# Patient Record
Sex: Male | Born: 1940
Health system: Southern US, Community
[De-identification: ages and names within clinical notes are randomized; demographics above are authoritative.]

## PROBLEM LIST (undated history)

## (undated) DIAGNOSIS — R413 Other amnesia: Secondary | ICD-10-CM

## (undated) HISTORY — PX: CATARACT EXTRACTION: SUR2

## (undated) HISTORY — DX: Other amnesia: R41.3

---

## 2004-06-26 ENCOUNTER — Ambulatory Visit: Payer: Self-pay | Admitting: Gastroenterology

## 2008-02-12 DIAGNOSIS — K219 Gastro-esophageal reflux disease without esophagitis: Secondary | ICD-10-CM | POA: Insufficient documentation

## 2008-02-17 ENCOUNTER — Ambulatory Visit: Payer: Self-pay | Admitting: Gastroenterology

## 2008-02-17 DIAGNOSIS — K648 Other hemorrhoids: Secondary | ICD-10-CM | POA: Insufficient documentation

## 2008-02-17 DIAGNOSIS — E785 Hyperlipidemia, unspecified: Secondary | ICD-10-CM | POA: Insufficient documentation

## 2008-03-15 ENCOUNTER — Ambulatory Visit: Payer: Self-pay | Admitting: Gastroenterology

## 2012-12-31 ENCOUNTER — Telehealth: Payer: Self-pay | Admitting: Cardiovascular Disease

## 2012-12-31 DIAGNOSIS — R972 Elevated prostate specific antigen [PSA]: Secondary | ICD-10-CM

## 2012-12-31 DIAGNOSIS — E782 Mixed hyperlipidemia: Secondary | ICD-10-CM

## 2012-12-31 DIAGNOSIS — R5383 Other fatigue: Secondary | ICD-10-CM

## 2012-12-31 DIAGNOSIS — Z79899 Other long term (current) drug therapy: Secondary | ICD-10-CM

## 2012-12-31 NOTE — Telephone Encounter (Signed)
Carlos Pierce is needing a lab order placed in the mail to him before his about with Dr.kelly on 01/08/13 @ 8:30am    Thanks

## 2013-01-01 NOTE — Telephone Encounter (Signed)
Labs ordered and mailed.  Returned call and pt informed.  Pt verbalized understanding and agreed w/ plan.

## 2013-01-02 ENCOUNTER — Ambulatory Visit: Payer: Self-pay | Admitting: Cardiovascular Disease

## 2013-01-06 ENCOUNTER — Telehealth: Payer: Self-pay | Admitting: Cardiovascular Disease

## 2013-01-06 NOTE — Telephone Encounter (Signed)
Pt called stating that he needed his lab slip mailed to him. That he has called before and asked for this.

## 2013-01-06 NOTE — Telephone Encounter (Signed)
Returned call.  Pt informed lab(s) ordered and to present FASTING to any [facility] to have completed.  Lab slip mailed last week.  Pt verbalized understanding and agreed w/ plan.

## 2013-01-08 ENCOUNTER — Ambulatory Visit: Payer: Self-pay | Admitting: Cardiovascular Disease

## 2013-01-13 LAB — COMPREHENSIVE METABOLIC PANEL
ALT: 43 U/L (ref 0–53)
CO2: 26 mEq/L (ref 19–32)
Calcium: 10 mg/dL (ref 8.4–10.5)
Chloride: 107 mEq/L (ref 96–112)
Potassium: 4.5 mEq/L (ref 3.5–5.3)
Sodium: 141 mEq/L (ref 135–145)
Total Protein: 6.9 g/dL (ref 6.0–8.3)

## 2013-01-13 LAB — LIPID PANEL
Cholesterol: 144 mg/dL (ref 0–200)
VLDL: 19 mg/dL (ref 0–40)

## 2013-01-13 LAB — CBC
Platelets: 163 10*3/uL (ref 150–400)
RBC: 4.6 MIL/uL (ref 4.22–5.81)
WBC: 6.3 10*3/uL (ref 4.0–10.5)

## 2013-01-13 LAB — PSA: PSA: 2.61 ng/mL (ref <=4.00)

## 2013-01-16 ENCOUNTER — Encounter: Payer: Self-pay | Admitting: Cardiovascular Disease

## 2013-01-16 ENCOUNTER — Ambulatory Visit (INDEPENDENT_AMBULATORY_CARE_PROVIDER_SITE_OTHER): Payer: Medicare Other | Admitting: Cardiovascular Disease

## 2013-01-16 VITALS — BP 102/62 | HR 66 | Ht 66.0 in | Wt 158.0 lb

## 2013-01-16 DIAGNOSIS — I110 Hypertensive heart disease with heart failure: Secondary | ICD-10-CM

## 2013-01-16 DIAGNOSIS — E782 Mixed hyperlipidemia: Secondary | ICD-10-CM

## 2013-01-16 DIAGNOSIS — E785 Hyperlipidemia, unspecified: Secondary | ICD-10-CM

## 2013-01-16 DIAGNOSIS — I509 Heart failure, unspecified: Secondary | ICD-10-CM

## 2013-01-16 DIAGNOSIS — I10 Essential (primary) hypertension: Secondary | ICD-10-CM | POA: Insufficient documentation

## 2013-01-16 MED ORDER — PITAVASTATIN CALCIUM 4 MG PO TABS: ORAL_TABLET | ORAL | Status: DC

## 2013-01-16 NOTE — Progress Notes (Signed)
Patient ID: Carlos Pierce, male   DOB: 01/03/1941, 72 y.o.   MRN: [id number]     HPI: Carlos Pierce, is a 72 y.o. male density office today for a month cardiology evaluation.  Carlos Pierce is now 72 years old. He has a history of mild hypertension as well as hyperlipidemia. Remotely, he has had mild elevation of PSAs which have been followed by doctors Kimbrough and more recently dull stent. All transaminase elevation in the past on Crestor.  Over the past time period 2013, laboratory at that time showed total cholesterol 213 triglycerides 136 HDL 46 LDL 140. PSA was 3.19. Normal LFTs. Socially, he has been on Levoxyl 2 mg in addition to Zetia 10 mg. Repeat blood work on 01/12/2013 showed a total cholesterol 144 HDL 55 LDL 70 VLDL 19 triglycerides 96. He is tolerating the medicine well without side effects. Liver function studies have remained normal. His PSA checked weight is decreased at 2.61. He remains active and exercises daily. The past 5 years he has lost 15-20 pounds as a result of his activity and improve diet.    Current Outpatient Prescriptions  Medication Sig Dispense Refill  . aspirin 81 MG tablet Take 81 mg by mouth daily.      . Multiple Vitamin (MULTI-VITAMIN DAILY PO) Take by mouth.      . finasteride (PROSCAR) 5 MG tablet Take 1 tablet by mouth daily.      Marland Kitchen lisinopril (PRINIVIL,ZESTRIL) 5 MG tablet Take 1 tablet by mouth daily.      Marland Kitchen LIVALO 2 MG TABS Take 1 tablet by mouth daily.      Marland Kitchen ZETIA 10 MG tablet Take 1 tablet by mouth daily.       No current facility-administered medications for this visit.    History   Social History  . Marital Status: Married    Spouse Name: N/A    Number of Children: N/A  . Years of Education: N/A   Occupational History  . Not on file.   Social History Main Topics  . Smoking status: Never Smoker   . Smokeless tobacco: Never Used  . Alcohol Use: 1.0 oz/week    2 drink(s) per week  . Drug Use: Not on file  .  Sexually Active: Not on file   Other Topics Concern  . Not on file   Social History Narrative  . No narrative on file    Additional social history is notable in that he is married has one child who is also my patient who has a history of hyperlipidemia.  ROS is negative for fevers, chills or night sweats. He denies chest pain. He denies palpitations. He denies presyncope or syncope. He denies wheezing. He denies bleeding. He denies abdominal pain. He denies paresthesias. He denies myalgias per   Other system review is negative.  PE BP 102/62  Pulse 66  Ht 5\' 6"  (1.676 m)  Wt 158 lb (71.668 kg)  BMI 25.51 kg/m2  Repeat blood pressure by me was 118/70 General: Alert, oriented, no distress.  Skin: normal turgor, no rashes HEENT: Normocephalic, atraumatic. Pupils round and reactive; sclera anicteric;no lid lag.  Nose without nasal septal hypertrophy Mouth/Parynx benign; Mallinpatti scale 2 Neck: No JVD, no carotid briuts Lungs: clear to ausculatation and percussion; no wheezing or rales Heart: RRR, s1 s2 normal 1/6 sem Abdomen: soft, nontender; no hepatosplenomehaly, BS+; abdominal aorta nontender and not dilated by palpation. Pulses 2+ Extremities: no clubbing cyanosis or edema, Homan's sign negative  Neurologic: grossly nonfocal  ECG: Sinus rhythm. Nondiagnostic Q wave in lead 3. Mild early repolarization.  LABS:  BMET    Component Value Date/Time   NA 141 01/12/2013 0900   K 4.5 01/12/2013 0900   CL 107 01/12/2013 0900   CO2 26 01/12/2013 0900   GLUCOSE 109* 01/12/2013 0900   BUN 27* 01/12/2013 0900   CREATININE 1.13 01/12/2013 0900   CALCIUM 10.0 01/12/2013 0900     Hepatic Function Panel     Component Value Date/Time   PROT 6.9 01/12/2013 0900   ALBUMIN 4.3 01/12/2013 0900   AST 27 01/12/2013 0900   ALT 43 01/12/2013 0900   ALKPHOS 39 01/12/2013 0900   BILITOT 0.5 01/12/2013 0900     CBC    Component Value Date/Time   WBC 6.3 01/12/2013 0900   RBC 4.60 01/12/2013 0900   HGB 13.5  01/12/2013 0900   HCT 41.3 01/12/2013 0900   PLT 163 01/12/2013 0900   MCV 89.8 01/12/2013 0900   MCH 29.3 01/12/2013 0900   MCHC 32.7 01/12/2013 0900   RDW 13.7 01/12/2013 0900     BNP No results found for this basename: probnp    Lipid Panel     Component Value Date/Time   CHOL 144 01/12/2013 0900   TRIG 96 01/12/2013 0900   HDL 55 01/12/2013 0900   CHOLHDL 2.6 01/12/2013 0900   VLDL 19 01/12/2013 0900   LDLCALC 70 01/12/2013 0900     RADIOLOGY: No results found.    ASSESSMENT AND PLAN: Clinically, Carlos Pierce is doing well. He tells me his weight at home today prior to putting close on was 152 pounds which is approximately 20 pound weight loss over the last 5 years. He is tolerating little low and combination therapy now as his lipid studies excellent with an LDL cholesterol of 70 to his exercising regularly. He is tolerating low-dose lisinopril with optimal blood pressure. He is exercising daily. I will see him in 6 months for cardiology evaluation or sooner if problems arise.    Lennette Bihari, MD, [facility]  01/16/2013 9:44 AM

## 2013-01-16 NOTE — Patient Instructions (Signed)
Your physician recommends that you schedule a follow-up appointment in: 6 MONTHS. No changes has been made in your therapy today. 

## 2013-01-23 ENCOUNTER — Encounter: Payer: Self-pay | Admitting: *Deleted

## 2013-05-04 ENCOUNTER — Other Ambulatory Visit: Payer: Self-pay | Admitting: *Deleted

## 2013-05-04 ENCOUNTER — Telehealth: Payer: Self-pay | Admitting: *Deleted

## 2013-05-04 DIAGNOSIS — I10 Essential (primary) hypertension: Secondary | ICD-10-CM

## 2013-05-04 DIAGNOSIS — Z125 Encounter for screening for malignant neoplasm of prostate: Secondary | ICD-10-CM

## 2013-05-04 DIAGNOSIS — R5381 Other malaise: Secondary | ICD-10-CM

## 2013-05-04 DIAGNOSIS — N429 Disorder of prostate, unspecified: Secondary | ICD-10-CM

## 2013-05-04 DIAGNOSIS — E782 Mixed hyperlipidemia: Secondary | ICD-10-CM

## 2013-05-04 DIAGNOSIS — R5383 Other fatigue: Secondary | ICD-10-CM

## 2013-05-04 NOTE — Telephone Encounter (Signed)
Labs ordered and slips mailed to patient. 

## 2013-05-04 NOTE — Telephone Encounter (Signed)
Pt had labs done in August 2014.  No orders for labs.  Will defer to [facility], CMA to discuss w/ Dr. Tresa Endo if needed.

## 2013-05-04 NOTE — Telephone Encounter (Signed)
Pt has an appointment in January and needs to get his lab work done. He wants to know if you can mail him a slip and he also wants to get his PSA drawn as well.

## 2013-06-01 LAB — COMPREHENSIVE METABOLIC PANEL
ALT: 31 U/L (ref 0–53)
CO2: 23 mEq/L (ref 19–32)
Calcium: 9.6 mg/dL (ref 8.4–10.5)
Chloride: 108 mEq/L (ref 96–112)
Creat: 1.22 mg/dL (ref 0.50–1.35)
Sodium: 140 mEq/L (ref 135–145)
Total Protein: 6.6 g/dL (ref 6.0–8.3)

## 2013-06-01 LAB — CBC
HCT: 39.6 % (ref 39.0–52.0)
Hemoglobin: 13.4 g/dL (ref 13.0–17.0)
MCV: 88 fL (ref 78.0–100.0)
Platelets: 151 10*3/uL (ref 150–400)
RBC: 4.5 MIL/uL (ref 4.22–5.81)
RDW: 13.8 % (ref 11.5–15.5)
WBC: 5.1 10*3/uL (ref 4.0–10.5)

## 2013-06-01 LAB — LIPID PANEL
Cholesterol: 121 mg/dL (ref 0–200)
HDL: 47 mg/dL (ref >39)
LDL Cholesterol: 55 mg/dL (ref 0–99)
Triglycerides: 97 mg/dL (ref <150)

## 2013-06-24 ENCOUNTER — Ambulatory Visit (INDEPENDENT_AMBULATORY_CARE_PROVIDER_SITE_OTHER): Payer: Medicare Other | Admitting: Cardiovascular Disease

## 2013-06-24 ENCOUNTER — Encounter: Payer: Self-pay | Admitting: Cardiovascular Disease

## 2013-06-24 VITALS — BP 122/64 | HR 71 | Ht 66.0 in | Wt 162.8 lb

## 2013-06-24 DIAGNOSIS — E785 Hyperlipidemia, unspecified: Secondary | ICD-10-CM

## 2013-06-24 DIAGNOSIS — I10 Essential (primary) hypertension: Secondary | ICD-10-CM

## 2013-06-24 DIAGNOSIS — K219 Gastro-esophageal reflux disease without esophagitis: Secondary | ICD-10-CM

## 2013-06-24 MED ORDER — PITAVASTATIN CALCIUM 2 MG PO TABS: 1.0000 | ORAL_TABLET | Freq: Every day | ORAL | Status: DC

## 2013-06-24 MED ORDER — EZETIMIBE 10 MG PO TABS: 10.0000 mg | ORAL_TABLET | Freq: Every day | ORAL | Status: DC

## 2013-06-24 MED ORDER — LISINOPRIL 5 MG PO TABS: 5.0000 mg | ORAL_TABLET | Freq: Every day | ORAL | Status: DC

## 2013-06-24 NOTE — Patient Instructions (Signed)
Your physician recommends that you schedule a follow-up appointment in: 1 year. No changes were made today in your therapy. 

## 2013-07-09 ENCOUNTER — Encounter: Payer: Self-pay | Admitting: Cardiovascular Disease

## 2013-07-09 NOTE — Progress Notes (Signed)
Patient ID: Carlos Pierce, male   DOB: May 11, 1941, 73 y.o.   MRN: [id number]      HPI: Carlos Pierce, is a 73 y.o. male who presents to the office today for a 6 month cardiology evaluation.  Carlos Pierce  has a history of mild hypertension as well as hyperlipidemia. Remotely, he has had mild elevation of PSAs which have been followed by Carlos Pierce and more recently Carlos Pierce.  He has had mild transaminase elevation in the past on Crestor.  In 2013 operatory revealed a total cholesterol 213 triglycerides 136 HDL 46 LDL 140. PSA was 3.19. Normal LFTs. Recently, he has been on Livalo 2 mg in addition to Zetia 10 mg. Repeat blood work on 01/12/2013 showed a total cholesterol 144 HDL 55 LDL 70 VLDL 19 triglycerides 96. He is tolerating the medicine well without side effects. Liver function studies have remained normal. His PSA checked weight is decreased at 2.61. He remains active and exercises daily. The past 5 years he has lost 15-20 pounds as a result of his activity and improve diet.  Since I last saw him, he continues to be active. He denies chest pain PND or orthopnea. He denies palpitations. He denies myalgias.    Current Outpatient Prescriptions  Medication Sig Dispense Refill  . aspirin 81 MG tablet Take 81 mg by mouth daily.      Marland Kitchen ezetimibe (ZETIA) 10 MG tablet Take 1 tablet (10 mg total) by mouth daily.  90 tablet  3  . finasteride (PROSCAR) 5 MG tablet Take 1 tablet by mouth daily.      Marland Kitchen lisinopril (PRINIVIL,ZESTRIL) 5 MG tablet Take 1 tablet (5 mg total) by mouth daily.  90 tablet  3  . Multiple Vitamin (MULTI-VITAMIN DAILY PO) Take by mouth.      . Pitavastatin Calcium (LIVALO) 2 MG TABS Take 1 tablet (2 mg total) by mouth daily.  90 tablet  3   No current facility-administered medications for this visit.    History   Social History  . Marital Status: Married    Spouse Name: N/A    Number of Children: N/A  . Years of Education: N/A   Occupational  History  . Not on file.   Social History Main Topics  . Smoking status: Never Smoker   . Smokeless tobacco: Never Used  . Alcohol Use: 1.0 oz/week    2 drink(s) per week  . Drug Use: Not on file  . Sexual Activity: Not on file   Other Topics Concern  . Not on file   Social History Narrative  . No narrative on file    Additional social history is notable in that he is married has one child who is also my patient who has a history of hyperlipidemia.  ROS is negative for fevers, chills or night sweats. He denies change in vision or hearing. He denies lymphadenopathy. There is no wheezing. He denies cough or increased sputum production per He denies chest pain. He denies palpitations. He denies presyncope or syncope.  He denies bleeding. He denies abdominal pain, nausea vomiting or diarrhea. There is a history of GERD.  He denies paresthesias. He denies myalgias. There is no claudication. Has no history of diabetes mellitus or thyroid abnormalities.   Other comprehensive 14 point system review is negative.  PE BP 122/64  Pulse 71  Ht 5\' 6"  (1.676 m)  Wt 162 lb 12.8 oz (73.846 kg)  BMI 26.29 kg/m2  Repeat blood pressure by me  was 118/70 General: Alert, oriented, no distress.  Skin: normal turgor, no rashes HEENT: Normocephalic, atraumatic. Pupils round and reactive; sclera anicteric;no lid lag.  Nose without nasal septal hypertrophy Mouth/Parynx benign; Mallinpatti scale 2 Neck: No JVD, no carotid bruits with normal carotid upstroke Chest wall: No tenderness to palpation Lungs: clear to ausculatation and percussion; no wheezing or rales Heart: RRR, s1 s2 normal 1/6 sem Abdomen: soft, nontender; no hepatosplenomehaly, BS+; abdominal aorta nontender and not dilated by palpation. Back: no CVA tenderness Pulses 2+ Extremities: no clubbing cyanosis or edema, Homan's sign negative  Neurologic: grossly nonfocal Psychological: Normal affect and mood  ECG: (Independently read by me):  Normal sinus rhythm at 71 beats per minute. No ST changes. Normal intervals.   LABS:  BMET    Component Value Date/Time   NA 140 06/01/2013 0928   K 4.1 06/01/2013 0928   CL 108 06/01/2013 0928   CO2 23 06/01/2013 0928   GLUCOSE 103* 06/01/2013 0928   BUN 28* 06/01/2013 0928   CREATININE 1.22 06/01/2013 0928   CALCIUM 9.6 06/01/2013 0928     Hepatic Function Panel     Component Value Date/Time   PROT 6.6 06/01/2013 0928   ALBUMIN 4.4 06/01/2013 0928   AST 23 06/01/2013 0928   ALT 31 06/01/2013 0928   ALKPHOS 43 06/01/2013 0928   BILITOT 0.7 06/01/2013 0928     CBC    Component Value Date/Time   WBC 5.1 06/01/2013 0921   RBC 4.50 06/01/2013 0921   HGB 13.4 06/01/2013 0921   HCT 39.6 06/01/2013 0921   PLT 151 06/01/2013 0921   MCV 88.0 06/01/2013 0921   MCH 29.8 06/01/2013 0921   MCHC 33.8 06/01/2013 0921   RDW 13.8 06/01/2013 0921     BNP No results found for this basename: probnp    Lipid Panel     Component Value Date/Time   CHOL 121 06/01/2013 0924   TRIG 97 06/01/2013 0924   HDL 47 06/01/2013 0924   CHOLHDL 2.6 06/01/2013 0924   VLDL 19 06/01/2013 0924   LDLCALC 55 06/01/2013 0924     RADIOLOGY: No results found.    ASSESSMENT AND PLAN: Clinically, Carlos Pierce is doing well. He has lost approximately 20 pounds over the last 5 years. He is tolerating little low as well as Zetia. His laboratory was reviewed with him in detail. LDL is excellent at 55 with a total cholesterol 121 HDL 47 and triglycerides 97. His blood pressure is controlled on his current dose of lisinopril at 5 mg. I recommended he continue his exercise program and excellent compliance. As long as he remains stable, I will see him in one year for followup evaluation.  Carlos Sine, MD, [facility]  07/09/2013 10:27 PM

## 2014-04-01 ENCOUNTER — Telehealth: Payer: Self-pay | Admitting: Cardiovascular Disease

## 2014-04-01 DIAGNOSIS — Z79899 Other long term (current) drug therapy: Secondary | ICD-10-CM

## 2014-04-01 DIAGNOSIS — R5383 Other fatigue: Secondary | ICD-10-CM

## 2014-04-01 DIAGNOSIS — R972 Elevated prostate specific antigen [PSA]: Secondary | ICD-10-CM

## 2014-04-01 DIAGNOSIS — N429 Disorder of prostate, unspecified: Secondary | ICD-10-CM

## 2014-04-01 DIAGNOSIS — E785 Hyperlipidemia, unspecified: Secondary | ICD-10-CM

## 2014-04-01 NOTE — Telephone Encounter (Signed)
Pt called in stating that he will need his lab orders mailed to him and he said to include the PSA. Please call  Thanks

## 2014-04-01 NOTE — Telephone Encounter (Signed)
Labs ordered and slips mailed to patient.  Patient notified - mailed to [id number]

## 2014-06-07 LAB — LIPID PANEL
Cholesterol: 159 mg/dL (ref 0–200)
HDL: 57 mg/dL (ref >39)
LDL CALC: 82 mg/dL (ref 0–99)
TRIGLYCERIDES: 100 mg/dL (ref <150)
Total CHOL/HDL Ratio: 2.8 Ratio
VLDL: 20 mg/dL (ref 0–40)

## 2014-06-07 LAB — CBC
HEMATOCRIT: 42 % (ref 39.0–52.0)
Hemoglobin: 13.6 g/dL (ref 13.0–17.0)
MCH: 29.4 pg (ref 26.0–34.0)
MCHC: 32.4 g/dL (ref 30.0–36.0)
MCV: 90.9 fL (ref 78.0–100.0)
Platelets: 144 10*3/uL — ABNORMAL LOW (ref 150–400)
RBC: 4.62 MIL/uL (ref 4.22–5.81)
RDW: 14.1 % (ref 11.5–15.5)
WBC: 5.7 10*3/uL (ref 4.0–10.5)

## 2014-06-07 LAB — COMPREHENSIVE METABOLIC PANEL
ALBUMIN: 4.3 g/dL (ref 3.5–5.2)
ALK PHOS: 42 U/L (ref 39–117)
ALT: 35 U/L (ref 0–53)
AST: 24 U/L (ref 0–37)
BUN: 26 mg/dL — AB (ref 6–23)
CALCIUM: 9.8 mg/dL (ref 8.4–10.5)
CO2: 26 meq/L (ref 19–32)
Chloride: 107 mEq/L (ref 96–112)
Creat: 1.09 mg/dL (ref 0.50–1.35)
GLUCOSE: 103 mg/dL — AB (ref 70–99)
POTASSIUM: 4.7 meq/L (ref 3.5–5.3)
SODIUM: 140 meq/L (ref 135–145)
TOTAL PROTEIN: 6.6 g/dL (ref 6.0–8.3)
Total Bilirubin: 0.5 mg/dL (ref 0.2–1.2)

## 2014-06-08 LAB — TSH: TSH: 3.344 u[IU]/mL (ref 0.350–4.500)

## 2014-06-08 LAB — PSA: PSA: 3.42 ng/mL (ref <=4.00)

## 2014-06-10 ENCOUNTER — Encounter: Payer: Self-pay | Admitting: *Deleted

## 2014-06-11 HISTORY — PX: HEMORRHOID BANDING: SHX5850

## 2014-06-22 ENCOUNTER — Encounter: Payer: Self-pay | Admitting: Cardiovascular Disease

## 2014-06-22 ENCOUNTER — Ambulatory Visit (INDEPENDENT_AMBULATORY_CARE_PROVIDER_SITE_OTHER): Payer: Medicare Other | Admitting: Cardiovascular Disease

## 2014-06-22 VITALS — BP 146/82 | HR 72 | Ht 66.0 in | Wt 160.0 lb

## 2014-06-22 DIAGNOSIS — I10 Essential (primary) hypertension: Secondary | ICD-10-CM

## 2014-06-22 DIAGNOSIS — E785 Hyperlipidemia, unspecified: Secondary | ICD-10-CM

## 2014-06-22 NOTE — Progress Notes (Signed)
Patient ID: Carlos Pierce, male   DOB: 17-Apr-1941, 74 y.o.   MRN: [id number]     HPI: Carlos Pierce is a 74 y.o. male who presents to the office today for a one year cardiology evaluation.  Mr. Carlos Pierce  has a history of mild hypertension as well as hyperlipidemia. Remotely, he has had mild elevation of PSAs which have been followed by Drs [name] and more recently Dr. [name].  He has had mild transaminase elevation in the past on Crestor.  In 2013 laboratory a total cholesterol 213 triglycerides 136 HDL 46 LDL 140. PSA was 3.19. Normal LFTs.  He has tolerated  Livalo 2 mg in addition to Zetia 10 mg. Repeat blood work on 01/12/2013 showed a total cholesterol 144 HDL 55 LDL 70 VLDL 19 triglycerides 96. He is tolerating the medicine well without side effects. Liver function studies have remained normal; PSA  decreased at 2.61. He remains active and exercises daily. Over the past 6 years he has lost 15-20 pounds as a result of his activity and improve diet.  Over the past year, he has continued to exercise approximate 5-6 days per week.  He exercises on a stationary bike for over an hour for approximately 18 miles 3 days per week and on alternating days does low resistance training.  He denies chest pain PND or orthopnea. He denies palpitations. He denies myalgias.  He had experienced one episode where he said up very abruptly and did have transient dizziness where he stumbled.  He has not had recurrence of this.  He denied associated palpitations.  He denied vertigo.  He underwent recent lab work 2 weeks ago, which continue to show excellent values as noted below.    Current Outpatient Prescriptions  Medication Sig Dispense Refill  . aspirin 81 MG tablet Take 81 mg by mouth daily.    Marland Kitchen ezetimibe (ZETIA) 10 MG tablet Take 1 tablet (10 mg total) by mouth daily. 90 tablet 3  . finasteride (PROSCAR) 5 MG tablet Take 1 tablet by mouth daily.    Marland Kitchen lisinopril (PRINIVIL,ZESTRIL) 5  MG tablet Take 1 tablet (5 mg total) by mouth daily. 90 tablet 3  . Multiple Vitamin (MULTI-VITAMIN DAILY PO) Take by mouth.    . Pitavastatin Calcium (LIVALO) 2 MG TABS Take 1 tablet (2 mg total) by mouth daily. 90 tablet 3   No current facility-administered medications for this visit.    History   Social History  . Marital Status: Married    Spouse Name: N/A    Number of Children: N/A  . Years of Education: N/A   Occupational History  . Not on file.   Social History Main Topics  . Smoking status: Never Smoker   . Smokeless tobacco: Never Used  . Alcohol Use: 1.0 oz/week    2 drink(s) per week  . Drug Use: Not on file  . Sexual Activity: Not on file   Other Topics Concern  . Not on file   Social History Narrative    Additional social history is notable in that he is married has one child who is also my patient who has a history of hyperlipidemia.  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  ROS General: Negative; No fevers, chills, or night sweats;  HEENT: Negative; No changes in vision or hearing, sinus congestion, difficulty swallowing Pulmonary: Negative; No cough, wheezing, shortness of breath, hemoptysis Cardiovascular: Negative; No chest pain, presyncope, syncope, palpitations GI: Negative; No nausea, vomiting, diarrhea, or  abdominal pain GU: Negative; No dysuria, hematuria, or difficulty voiding Musculoskeletal: Negative; no myalgias, joint pain, or weakness Hematologic/Oncology: Negative; no easy bruising, bleeding Endocrine: Negative; no heat/cold intolerance; no diabetes Neuro: Negative; no changes in balance, headaches Skin: Negative; No rashes or skin lesions Psychiatric: Negative; No behavioral problems, depression Sleep: Negative; No snoring, daytime sleepiness, hypersomnolence, bruxism, restless legs, hypnogognic hallucinations, no cataplexy Other comprehensive 14 point system review is negative.   PE BP 146/82 mmHg  Pulse 72  Ht 5\' 6"   (1.676 m)  Wt 160 lb (72.576 kg)  BMI 25.84 kg/m2  Repeat blood pressure by me was 140/80 supine and 130/80 standing General: Alert, oriented, no distress.  Skin: normal turgor, no rashes HEENT: Normocephalic, atraumatic. Pupils round and reactive; sclera anicteric;no lid lag.  Nose without nasal septal hypertrophy Mouth/Parynx benign; Mallinpatti scale 2 Neck: No JVD, no carotid bruits with normal carotid upstroke Chest wall: No tenderness to palpation Lungs: clear to ausculatation and percussion; no wheezing or rales Heart: RRR, s1 s2 normal 1/6 sem; no diastolic murmur.  No rubs thrills or heaves Abdomen: soft, nontender; no hepatosplenomehaly, BS+; abdominal aorta nontender and not dilated by palpation. Back: no CVA tenderness Pulses 2+ Extremities: no clubbing cyanosis or edema, Homan's sign negative  Neurologic: grossly nonfocal Psychological: Normal affect and mood  ECG: (Independently read by me): Normal sinus rhythm at 72 bpm.  No ectopy.  Normal intervals.  ECG: (Independently read by me): Normal sinus rhythm at 71 beats per minute. No ST changes. Normal intervals.   LABS:  BMET    Component Value Date/Time   NA 140 06/07/2014 0824   K 4.7 06/07/2014 0824   CL 107 06/07/2014 0824   CO2 26 06/07/2014 0824   GLUCOSE 103* 06/07/2014 0824   BUN 26* 06/07/2014 0824   CREATININE 1.09 06/07/2014 0824   CALCIUM 9.8 06/07/2014 0824     Hepatic Function Panel     Component Value Date/Time   PROT 6.6 06/07/2014 0824   ALBUMIN 4.3 06/07/2014 0824   AST 24 06/07/2014 0824   ALT 35 06/07/2014 0824   ALKPHOS 42 06/07/2014 0824   BILITOT 0.5 06/07/2014 0824     CBC    Component Value Date/Time   WBC 5.7 06/07/2014 0824   RBC 4.62 06/07/2014 0824   HGB 13.6 06/07/2014 0824   HCT 42.0 06/07/2014 0824   PLT 144* 06/07/2014 0824   MCV 90.9 06/07/2014 0824   MCH 29.4 06/07/2014 0824   MCHC 32.4 06/07/2014 0824   RDW 14.1 06/07/2014 0824     BNP No results  found for: PROBNP  Lipid Panel     Component Value Date/Time   CHOL 159 06/07/2014 0824   TRIG 100 06/07/2014 0824   HDL 57 06/07/2014 0824   CHOLHDL 2.8 06/07/2014 0824   VLDL 20 06/07/2014 0824   LDLCALC 82 06/07/2014 0824     RADIOLOGY: No results found.    ASSESSMENT AND PLAN: Mr. Carlos Pierce is a 74 year old gentleman who continues to do well doing.  He has lost approximately 20 pounds over the last 5 years.  He has a history of mild hypertension and mild hyperlipidemia.  He has noted rare episode of dizziness if he stands quickly.  He was not found to be significantly orthostatic on evaluation today, although there was a transient drop in blood pressure from 376 systolic to 283 systolic.  He did not have significant pulse rise.  He is on lisinopril at 5 mg daily with blood pressure  control.  He is tolerating this from a renal standpoint and has normal renal function with a creatinine of 1.09 and potassium of 4.7.  He has mild hyperlipidemia and is tolerating combination therapy with Livalo and  Zetia .  His most recent cholesterol at 159, triglycerides 100, HDL 57, and LDL cholesterol 82.  I have commended him on his continued exercise  and discussed with him current work and Heart Association guidelines with reference to exercise training.  As long as he remains stable I will  see him in one year for reevaluation prior to that office visit one year laboratory will be obtained.   Troy Sine, MD, [facility]  06/22/2014 8:08 AM

## 2014-06-22 NOTE — Patient Instructions (Signed)
Your physician wants you to follow-up in: 1 year or sooner if needed with Dr. Kelly. You will receive a reminder letter in the mail two months in advance. If you don't receive a letter, please call our office to schedule the follow-up appointment. 

## 2014-07-05 ENCOUNTER — Other Ambulatory Visit: Payer: Self-pay | Admitting: Cardiovascular Disease

## 2014-07-05 NOTE — Telephone Encounter (Signed)
Rx refill sent to patient pharmacy   

## 2014-07-13 ENCOUNTER — Other Ambulatory Visit: Payer: Self-pay | Admitting: Cardiovascular Disease

## 2014-07-13 NOTE — Telephone Encounter (Signed)
Rx(s) sent to pharmacy electronically.  

## 2014-07-27 ENCOUNTER — Other Ambulatory Visit: Payer: Self-pay | Admitting: Cardiovascular Disease

## 2014-07-27 NOTE — Telephone Encounter (Signed)
Rx refill denied to patient pharmacy

## 2014-09-21 ENCOUNTER — Other Ambulatory Visit: Payer: Self-pay | Admitting: Otolaryngology

## 2014-09-21 DIAGNOSIS — H903 Sensorineural hearing loss, bilateral: Secondary | ICD-10-CM

## 2014-09-21 DIAGNOSIS — H905 Unspecified sensorineural hearing loss: Secondary | ICD-10-CM

## 2014-10-13 ENCOUNTER — Ambulatory Visit
Admission: RE | Admit: 2014-10-13 | Discharge: 2014-10-13 | Disposition: A | Discharge status: Home / Self Care | Payer: Medicare Other | Source: Ambulatory Visit | Attending: Otolaryngology | Admitting: Otolaryngology

## 2014-10-13 DIAGNOSIS — H905 Unspecified sensorineural hearing loss: Secondary | ICD-10-CM

## 2014-10-13 DIAGNOSIS — H903 Sensorineural hearing loss, bilateral: Secondary | ICD-10-CM

## 2014-10-13 MED ORDER — GADOBENATE DIMEGLUMINE 529 MG/ML IV SOLN
14.0000 mL | Freq: Once | INTRAVENOUS | Status: AC | PRN
  Administered 2014-10-13: 14 mL via INTRAVENOUS

## 2015-02-08 ENCOUNTER — Encounter: Payer: Self-pay | Admitting: Cardiovascular Disease

## 2015-03-15 ENCOUNTER — Telehealth: Payer: Self-pay | Admitting: Cardiovascular Disease

## 2015-03-15 DIAGNOSIS — R5383 Other fatigue: Secondary | ICD-10-CM

## 2015-03-15 DIAGNOSIS — E785 Hyperlipidemia, unspecified: Secondary | ICD-10-CM

## 2015-03-15 DIAGNOSIS — R972 Elevated prostate specific antigen [PSA]: Secondary | ICD-10-CM

## 2015-03-15 DIAGNOSIS — Z79899 Other long term (current) drug therapy: Secondary | ICD-10-CM

## 2015-03-15 NOTE — Telephone Encounter (Signed)
Lipid, TSH, CBC, CMET, PSA ordered Notified patient's wife that lab slips will be mailed to patient. She will make him aware.

## 2015-03-15 NOTE — Telephone Encounter (Signed)
Mr.Parlee is calling to get lab orders for his regular labs and please add a PSA to that. Thanks

## 2015-06-03 LAB — CBC
HEMATOCRIT: 39.7 % (ref 39.0–52.0)
HEMOGLOBIN: 13.4 g/dL (ref 13.0–17.0)
MCH: 30.2 pg (ref 26.0–34.0)
MCHC: 33.8 g/dL (ref 30.0–36.0)
MCV: 89.4 fL (ref 78.0–100.0)
MPV: 12 fL (ref 8.6–12.4)
Platelets: 146 10*3/uL — ABNORMAL LOW (ref 150–400)
RBC: 4.44 MIL/uL (ref 4.22–5.81)
RDW: 13.9 % (ref 11.5–15.5)
WBC: 5.6 10*3/uL (ref 4.0–10.5)

## 2015-06-03 LAB — COMPREHENSIVE METABOLIC PANEL
ALBUMIN: 4.1 g/dL (ref 3.6–5.1)
ALK PHOS: 37 U/L — AB (ref 40–115)
ALT: 25 U/L (ref 9–46)
AST: 19 U/L (ref 10–35)
BUN: 27 mg/dL — ABNORMAL HIGH (ref 7–25)
CALCIUM: 9.1 mg/dL (ref 8.6–10.3)
CO2: 25 mmol/L (ref 20–31)
Chloride: 107 mmol/L (ref 98–110)
Creat: 1.16 mg/dL (ref 0.70–1.18)
Glucose, Bld: 105 mg/dL — ABNORMAL HIGH (ref 65–99)
Potassium: 4.1 mmol/L (ref 3.5–5.3)
Sodium: 139 mmol/L (ref 135–146)
Total Bilirubin: 0.5 mg/dL (ref 0.2–1.2)
Total Protein: 6.3 g/dL (ref 6.1–8.1)

## 2015-06-03 LAB — LIPID PANEL
CHOL/HDL RATIO: 3 ratio (ref <=5.0)
CHOLESTEROL: 141 mg/dL (ref 125–200)
HDL: 47 mg/dL (ref >=40)
LDL Cholesterol: 75 mg/dL (ref <130)
Triglycerides: 96 mg/dL (ref <150)
VLDL: 19 mg/dL (ref <30)

## 2015-06-03 LAB — TSH: TSH: 2.165 u[IU]/mL (ref 0.350–4.500)

## 2015-06-04 LAB — PSA: PSA: 3.54 ng/mL (ref <=4.00)

## 2015-06-16 ENCOUNTER — Encounter: Payer: Self-pay | Admitting: *Deleted

## 2015-06-17 ENCOUNTER — Encounter: Payer: Self-pay | Admitting: Cardiovascular Disease

## 2015-06-17 ENCOUNTER — Ambulatory Visit (INDEPENDENT_AMBULATORY_CARE_PROVIDER_SITE_OTHER): Payer: Medicare Other | Admitting: Cardiovascular Disease

## 2015-06-17 VITALS — BP 130/86 | HR 67 | Ht 66.0 in | Wt 156.9 lb

## 2015-06-17 DIAGNOSIS — I10 Essential (primary) hypertension: Secondary | ICD-10-CM

## 2015-06-17 DIAGNOSIS — E785 Hyperlipidemia, unspecified: Secondary | ICD-10-CM

## 2015-06-17 MED ORDER — PITAVASTATIN CALCIUM 2 MG PO TABS: 1.0000 | ORAL_TABLET | Freq: Every day | ORAL | Status: DC

## 2015-06-17 MED ORDER — LISINOPRIL 5 MG PO TABS: 5.0000 mg | ORAL_TABLET | Freq: Every day | ORAL | Status: DC

## 2015-06-17 MED ORDER — EZETIMIBE 10 MG PO TABS: 10.0000 mg | ORAL_TABLET | Freq: Every day | ORAL | Status: DC

## 2015-06-17 NOTE — Patient Instructions (Signed)
Your physician wants you to follow-up in: 1 year or sooner if needed. You will receive a reminder letter in the mail two months in advance. If you don't receive a letter, please call our office to schedule the follow-up appointment.  

## 2015-06-17 NOTE — Progress Notes (Signed)
Patient ID: Carlos Pierce, male   DOB: 07-Oct-1940, 75 y.o.   MRN: [id number]     HPI: Carlos Pierce is a 75 y.o. male who presents to the office today for a one year cardiology evaluation.  Carlos Pierce  has a history of mild hypertension and hyperlipidemia. Remotely, he has had mild elevation of PSAs which have been followed by Drs [name] and more recently Dr. [name].  He has had mild transaminase elevation in the past on Crestor.  In 2013 laboratory revealed  a total cholesterol 213 triglycerides 136 HDL 46 LDL 140. PSA was 3.19. Normal LFTs.  He has tolerated  Livalo 2 mg in addition to Zetia 10 mg. Repeat blood work on 01/12/2013 showed a total cholesterol 144 HDL 55 LDL 70 VLDL 19 triglycerides 96. He is tolerating the medicine well without side effects. Liver function studies have remained normal; PSA  decreased at 2.61. He remains active and exercises daily. Over the past 6 years he has lost 15-20 pounds as a result of his activity and improve diet.  Over the past year, he has continued to exercise at least  5 days per week.  He exercises on a stationary bike for over an hour for approximately 18 miles 3 days per week and on alternating days does low resistance training.  Since I last saw him, he had undergone a colonoscopy by Dr. [name].He denies chest pain PND or orthopnea. He denies palpitations. He denies myalgias.  He had experienced one episode where he said up very abruptly and did have transient dizziness where he stumbled.  He has not had recurrence of this.  He denied associated palpitations.  He denied vertigo.  He underwent recent lab work  [employer] 23 2016 as noted below. Lipid studies were excellent with a total cholesterol 141, triglycerides 96, HDL 47 and LDL 75.  PSA was 3.54.  He will be seeing Dr. [name] in follow-up on February 1.  Fasting glucose borderline increased at 105. He had normal LFTs.  He presents for evaluation.    Current  Outpatient Prescriptions  Medication Sig Dispense Refill  . Apoaequorin (PREVAGEN PO) Take 20 mg by mouth daily.    Marland Kitchen aspirin 81 MG tablet Take 81 mg by mouth daily.    . ASTRAGALUS PO Take 400 mg by mouth daily.    Carlos Pierce 1000 MG TABS Take 1 tablet by mouth daily.    . Bran 500 MG TABS Take 1 tablet by mouth 2 (two) times daily.    Carlos Pierce TABS Take 1 tablet by mouth daily.    . Coenzyme Q10 (CO Q 10 PO) Take 300 mg by mouth daily.    Marland Kitchen ezetimibe (ZETIA) 10 MG tablet Take 1 tablet (10 mg total) by mouth daily. 90 tablet 3  . finasteride (PROSCAR) 5 MG tablet Take 1 tablet by mouth daily.    . fluticasone (FLONASE) 50 MCG/ACT nasal spray Place 1 spray into both nostrils daily.    . Ginkgo Biloba 40 MG TABS Take 240 mg by mouth daily.    Marland Kitchen L-Tyrosine 500 MG CAPS Take 1 capsule by mouth daily.    Marland Kitchen lisinopril (PRINIVIL,ZESTRIL) 5 MG tablet Take 1 tablet (5 mg total) by mouth daily. 90 tablet 3  . Lutein-Zeaxanthin 25-5 MG CAPS Take 1 capsule by mouth daily.    . Milk Thistle 1000 MG CAPS Take 1 capsule by mouth daily.    . Multiple Vitamin (MULTI-VITAMIN DAILY PO) Take  by mouth.    . Multiple Vitamins-Minerals (MULTIVITAMIN & MINERAL PO) Take 1 tablet by mouth daily.    . Omega-3 Fatty Acids (OMEGA 3 PO) Take 1,000 mg by mouth daily.    . Pitavastatin Calcium (LIVALO) 2 MG TABS Take 1 tablet (2 mg total) by mouth daily. 90 tablet 3   No current facility-administered medications for this visit.    Social History   Social History  . Marital Status: Married    Spouse Name: N/A  . Number of Children: N/A  . Years of Education: N/A   Occupational History  . Not on file.   Social History Main Topics  . Smoking status: Never Smoker   . Smokeless tobacco: Never Used  . Alcohol Use: 1.0 oz/week    2 drink(s) per week  . Drug Use: Not on file  . Sexual Activity: Not on file   Other Topics Concern  . Not on file   Social History Narrative    Additional social  history is notable in that he is married has one child who is also my patient who has a history of hyperlipidemia.  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  ROS General: Negative; No fevers, chills, or night sweats;  HEENT: Negative; No changes in vision or hearing, sinus congestion, difficulty swallowing Pulmonary: Negative; No cough, wheezing, shortness of breath, hemoptysis Cardiovascular: Negative; No chest pain, presyncope, syncope, palpitations GI: Negative; No nausea, vomiting, diarrhea, or abdominal pain GU: Negative; No dysuria, hematuria, or difficulty voiding Musculoskeletal: Negative; no myalgias, joint pain, or weakness Hematologic/Oncology: Negative; no easy bruising, bleeding Endocrine: Negative; no heat/cold intolerance; no diabetes Neuro: Negative; no changes in balance, headaches Skin: Negative; No rashes or skin lesions Psychiatric: Negative; No behavioral problems, depression Sleep: Negative; No snoring, daytime sleepiness, hypersomnolence, bruxism, restless legs, hypnogognic hallucinations, no cataplexy Other comprehensive 14 point system review is negative.   PE BP 130/86 mmHg  Pulse 67  Ht _0  (1.676 m)  Wt 156 lb 14.4 oz (71.169 kg)  BMI 25.34 kg/m2  Repeat blood pressure by me was 130/76  Wt Readings from Last 3 Encounters:  06/17/15 156 lb 14.4 oz (71.169 kg)  10/13/14 160 lb (72.576 kg)  06/22/14 160 lb (72.576 kg)  General: Alert, oriented, no distress.  Skin: normal turgor, no rashes HEENT: Normocephalic, atraumatic. Pupils round and reactive; sclera anicteric;no lid lag.  Nose without nasal septal hypertrophy Mouth/Parynx benign; Mallinpatti scale 2 Neck: No JVD, no carotid bruits with normal carotid upstroke Chest wall: No tenderness to palpation Lungs: clear to ausculatation and percussion; no wheezing or rales Heart: RRR, s1 s2 normal 1/6 sem; no diastolic murmur.  No rubs thrills or heaves Abdomen: soft, nontender; no  hepatosplenomehaly, BS+; abdominal aorta nontender and not dilated by palpation. Back: no CVA tenderness Pulses 2+ Extremities: no clubbing cyanosis or edema, Homan's sign negative  Neurologic: grossly nonfocal Psychological: Normal affect and mood  ECG (independently read by me):  Normal sinus rhythm at 67 bpm.  No ectopy.  Q wave in lead 3 and aVF with preserved R waves.  January 2016ECG: (Independently read by me): Normal sinus rhythm at 72 bpm.  No ectopy.  Normal intervals.   anuary 2015ECG: (Independently read by me): Normal sinus rhythm at 71 beats per minute. No ST changes. Normal intervals.   LABS: BMP Latest Ref Rng 06/03/2015 06/07/2014 06/01/2013  Glucose 65 - 99 mg/dL 105(H) 103(H) 103(H)  BUN 7 - 25 mg/dL 27(H) 26(H) 28(H)  Creatinine  0.70 - 1.18 mg/dL 1.16 1.09 1.22  Sodium 135 - 146 mmol/L 139 140 140  Potassium 3.5 - 5.3 mmol/L 4.1 4.7 4.1  Chloride 98 - 110 mmol/L 107 107 108  CO2 20 - 31 mmol/L _0 Calcium 8.6 - 10.3 mg/dL 9.1 9.8 9.6   Hepatic Function Latest Ref Rng 06/03/2015 06/07/2014 06/01/2013  Total Protein 6.1 - 8.1 g/dL 6.3 6.6 6.6  Albumin 3.6 - 5.1 g/dL 4.1 4.3 4.4  AST 10 - 35 U/L _1 ALT 9 - 46 U/L 25 35 31  Alk Phosphatase 40 - 115 U/L 37(L) 42 43  Total Bilirubin 0.2 - 1.2 mg/dL 0.5 0.5 0.7   CBC Latest Ref Rng 06/03/2015 06/07/2014 06/01/2013  WBC 4.0 - 10.5 K/uL 5.6 5.7 5.1  Hemoglobin 13.0 - 17.0 g/dL 13.4 13.6 13.4  Hematocrit 39.0 - 52.0 % 39.7 42.0 39.6  Platelets 150 - 400 K/uL 146(L) 144(L) 151   Lab Results  Component Value Date   MCV 89.4 06/03/2015   MCV 90.9 06/07/2014   MCV 88.0 06/01/2013    No results found for: HGBA1C  Lab Results  Component Value Date   TSH 2.165 06/03/2015   Lipid Panel     Component Value Date/Time   CHOL 141 06/03/2015 0810   TRIG 96 06/03/2015 0810   HDL 47 06/03/2015 0810   CHOLHDL 3.0 06/03/2015 0810   VLDL 19 06/03/2015 0810   LDLCALC 75 06/03/2015 0810      RADIOLOGY: No results found.    ASSESSMENT AND PLAN: Carlos Pierce is a 75 year old gentleman who has a history of hypertension and hyperlipidemia. He has lost approximately 20 pounds over the last 6 years.  He has noted rare episode of dizziness if he stands quickly  And has not been found to be orthostatic.  He continues to do well and exercises regularly. He is tolerating combination therapy with Zetia 10 mg and Livalo 2 mg daily with significant proven in his lipid studies. His renal function remained stable with a creatinine of 1.16.  BUN was minimally increased at 27.  He is tolerating low-dose lisinopril at 5 mg daily.  He also takes Proscar for BPH.  He is followed by Dr. Junious Dresser stented and his PSA has been fairly stable , although mildly increased at 3.54 from one year ago at 3.42 and from 2 years ago at 2.55. He underwent recent colonoscopy was found to have 3.  Hemorrhoids. His ECG remained stable. His weight has been stable since his initial weight loss  And compared to last year he has lost an additional 6 pounds.  I have recommended he continue his exercise regimen.  As long as he remains stable I will see him in one year for reevaluation.   Troy Sine, MD, [facility]  06/17/2015 6:16 PM

## 2015-07-12 ENCOUNTER — Encounter: Payer: Self-pay | Admitting: Gastroenterology

## 2015-07-13 DIAGNOSIS — N400 Enlarged prostate without lower urinary tract symptoms: Secondary | ICD-10-CM | POA: Diagnosis not present

## 2015-07-13 DIAGNOSIS — R972 Elevated prostate specific antigen [PSA]: Secondary | ICD-10-CM | POA: Diagnosis not present

## 2015-07-13 DIAGNOSIS — Z0000 Encounter for general adult medical examination without abnormal findings: Secondary | ICD-10-CM | POA: Diagnosis not present

## 2015-07-13 DIAGNOSIS — R400 Somnolence: Secondary | ICD-10-CM | POA: Diagnosis not present

## 2015-09-08 DIAGNOSIS — J069 Acute upper respiratory infection, unspecified: Secondary | ICD-10-CM | POA: Diagnosis not present

## 2016-03-21 ENCOUNTER — Telehealth: Payer: Self-pay | Admitting: Cardiovascular Disease

## 2016-03-21 DIAGNOSIS — R972 Elevated prostate specific antigen [PSA]: Secondary | ICD-10-CM

## 2016-03-21 DIAGNOSIS — I10 Essential (primary) hypertension: Secondary | ICD-10-CM

## 2016-03-21 DIAGNOSIS — E785 Hyperlipidemia, unspecified: Secondary | ICD-10-CM

## 2016-03-21 DIAGNOSIS — R39198 Other difficulties with micturition: Secondary | ICD-10-CM

## 2016-03-21 NOTE — Telephone Encounter (Signed)
Pt would like his lab order mailed to him before his appt with Dr Claiborne Billings on 07-03-16.He also would like for you to add the PSA to his lab order.

## 2016-03-21 NOTE — Telephone Encounter (Signed)
Orders entered and mailed to patient.

## 2016-03-21 NOTE — Telephone Encounter (Signed)
Pt.notified

## 2016-06-22 ENCOUNTER — Other Ambulatory Visit: Payer: Self-pay | Admitting: Cardiovascular Disease

## 2016-06-29 DIAGNOSIS — R972 Elevated prostate specific antigen [PSA]: Secondary | ICD-10-CM | POA: Diagnosis not present

## 2016-06-29 DIAGNOSIS — I10 Essential (primary) hypertension: Secondary | ICD-10-CM | POA: Diagnosis not present

## 2016-06-29 DIAGNOSIS — E785 Hyperlipidemia, unspecified: Secondary | ICD-10-CM | POA: Diagnosis not present

## 2016-06-29 DIAGNOSIS — R39198 Other difficulties with micturition: Secondary | ICD-10-CM | POA: Diagnosis not present

## 2016-06-29 LAB — COMPREHENSIVE METABOLIC PANEL
ALK PHOS: 48 U/L (ref 40–115)
ALT: 35 U/L (ref 9–46)
AST: 25 U/L (ref 10–35)
Albumin: 4.4 g/dL (ref 3.6–5.1)
BILIRUBIN TOTAL: 0.7 mg/dL (ref 0.2–1.2)
BUN: 27 mg/dL — AB (ref 7–25)
CO2: 26 mmol/L (ref 20–31)
CREATININE: 1.17 mg/dL (ref 0.70–1.18)
Calcium: 9.7 mg/dL (ref 8.6–10.3)
Chloride: 107 mmol/L (ref 98–110)
GLUCOSE: 103 mg/dL — AB (ref 65–99)
Potassium: 4.4 mmol/L (ref 3.5–5.3)
Sodium: 141 mmol/L (ref 135–146)
TOTAL PROTEIN: 6.9 g/dL (ref 6.1–8.1)

## 2016-06-29 LAB — CBC
HCT: 40.5 % (ref 38.5–50.0)
HEMOGLOBIN: 13.6 g/dL (ref 13.2–17.1)
MCH: 30.3 pg (ref 27.0–33.0)
MCHC: 33.6 g/dL (ref 32.0–36.0)
MCV: 90.2 fL (ref 80.0–100.0)
MPV: 10.8 fL (ref 7.5–12.5)
Platelets: 167 10*3/uL (ref 140–400)
RBC: 4.49 MIL/uL (ref 4.20–5.80)
RDW: 13.8 % (ref 11.0–15.0)
WBC: 5.9 10*3/uL (ref 3.8–10.8)

## 2016-06-29 LAB — LIPID PANEL
Cholesterol: 146 mg/dL (ref <200)
HDL: 51 mg/dL (ref >40)
LDL Cholesterol: 69 mg/dL (ref <100)
Total CHOL/HDL Ratio: 2.9 Ratio (ref <5.0)
Triglycerides: 130 mg/dL (ref <150)
VLDL: 26 mg/dL (ref <30)

## 2016-06-29 LAB — PSA: PSA: 2.7 ng/mL (ref <=4.0)

## 2016-07-03 ENCOUNTER — Encounter: Payer: Self-pay | Admitting: Cardiovascular Disease

## 2016-07-03 ENCOUNTER — Ambulatory Visit (INDEPENDENT_AMBULATORY_CARE_PROVIDER_SITE_OTHER): Payer: Medicare Other | Admitting: Cardiovascular Disease

## 2016-07-03 VITALS — BP 123/80 | HR 60 | Ht 66.0 in | Wt 157.8 lb

## 2016-07-03 DIAGNOSIS — E785 Hyperlipidemia, unspecified: Secondary | ICD-10-CM | POA: Diagnosis not present

## 2016-07-03 DIAGNOSIS — F0390 Unspecified dementia without behavioral disturbance: Secondary | ICD-10-CM | POA: Diagnosis not present

## 2016-07-03 DIAGNOSIS — I10 Essential (primary) hypertension: Secondary | ICD-10-CM | POA: Diagnosis not present

## 2016-07-03 MED ORDER — LISINOPRIL 5 MG PO TABS: 5.0000 mg | ORAL_TABLET | Freq: Every day | ORAL | 3 refills | Status: DC

## 2016-07-03 MED ORDER — PITAVASTATIN CALCIUM 2 MG PO TABS: 1.0000 | ORAL_TABLET | Freq: Every day | ORAL | 3 refills | Status: DC

## 2016-07-03 MED ORDER — EZETIMIBE 10 MG PO TABS: 10.0000 mg | ORAL_TABLET | Freq: Every day | ORAL | 3 refills | Status: DC

## 2016-07-03 NOTE — Patient Instructions (Signed)
Dr Kelly recommends that you schedule a follow-up appointment in 1 year. You will receive a reminder letter in the mail two months in advance. If you don't receive a letter, please call our office to schedule the follow-up appointment.  If you need a refill on your cardiac medications before your next appointment, please call your pharmacy. 

## 2016-07-05 NOTE — Progress Notes (Signed)
Patient ID: Carlos Pierce, male   DOB: March 26, 1941, 76 y.o.   MRN: [id number]     HPI: Carlos Pierce is a 76 y.o. male who presents to the office today for a one year cardiology evaluation.  Mr. Gell  has a history of mild hypertension and hyperlipidemia. Remotely, he has had mild elevation of PSAs which have been followed by Drs [name] and more recently Dr. [name].  He has had mild transaminase elevation in the past on Crestor.  In 2013 laboratory revealed  a total cholesterol 213 triglycerides 136 HDL 46 LDL 140. PSA was 3.19. Normal LFTs.  He has tolerated  Livalo 2 mg in addition to Zetia 10 mg. Repeat blood work on 01/12/2013 showed a total cholesterol 144 HDL 55 LDL 70 VLDL 19 triglycerides 96. He is tolerating the medicine well without side effects. Liver function studies have remained normal; PSA  decreased at 2.61. He remains active and exercises daily. Over the past 6 years he has lost 15-20 pounds as a result of his activity and improve diet.  Over the past year, he has continued to exercise at least  5 days per week.  He exercises on a stationary bike for over an hour for approximately 18 miles 3 days per week and on alternating days does low resistance training.  Since I last saw him, he had undergone a colonoscopy by Dr. [name].He denies chest pain PND or orthopnea. He denies palpitations. He denies myalgias.  He had experienced one episode where he said up very abruptly and did have transient dizziness where he stumbled.  He has not had recurrence of this.  He denied associated palpitations.  He denied vertigo.  In December 2016 lipid studies were excellent with a total cholesterol 141, triglycerides 96, HDL 47 and LDL 75.  PSA was 3.54.   Over the past year, he has continued to do well.  He remains active.  There is no chest pain or shortness of breath.  He denies any palpitations.  In 06/29/2016.  Repeat laboratory was performed.  CBC was normal.  PSA was  2.7.  When 27, creatinine 1.17.  LFTs were normal.  Lipid studies continue to be excellent with a total cholesterol of 146, triglycerides 1:30, HDL 51, and LDL 69.  He presents for evaluation.   Current Outpatient Prescriptions  Medication Sig Dispense Refill  . Apoaequorin (PREVAGEN PO) Take 20 mg by mouth daily.    Marland Kitchen aspirin 81 MG tablet Take 81 mg by mouth daily.    . ASTRAGALUS PO Take 400 mg by mouth daily.    Raelyn Ensign Pollen 1000 MG TABS Take 1 tablet by mouth daily.    . Bran 500 MG TABS Take 1 tablet by mouth 2 (two) times daily.    Valente David Yeast TABS Take 1 tablet by mouth daily.    . Coenzyme Q10 (CO Q 10 PO) Take 300 mg by mouth daily.    Marland Kitchen ezetimibe (ZETIA) 10 MG tablet Take 1 tablet (10 mg total) by mouth daily. 90 tablet 3  . finasteride (PROSCAR) 5 MG tablet Take 1 tablet by mouth daily.    . fluticasone (FLONASE) 50 MCG/ACT nasal spray Place 1 spray into both nostrils daily.    . Ginkgo Biloba 40 MG TABS Take 240 mg by mouth daily.    Marland Kitchen L-Tyrosine 500 MG CAPS Take 1 capsule by mouth daily.    Marland Kitchen lisinopril (PRINIVIL,ZESTRIL) 5 MG tablet Take 1 tablet (5 mg total) by  mouth daily. 90 tablet 3  . Lutein-Zeaxanthin 25-5 MG CAPS Take 1 capsule by mouth daily.    . Milk Thistle 1000 MG CAPS Take 1 capsule by mouth daily.    . Multiple Vitamin (MULTI-VITAMIN DAILY PO) Take by mouth.    . Multiple Vitamins-Minerals (MULTIVITAMIN & MINERAL PO) Take 1 tablet by mouth daily.    . Omega-3 Fatty Acids (OMEGA 3 PO) Take 1,000 mg by mouth daily.    . Pitavastatin Calcium (LIVALO) 2 MG TABS Take 1 tablet (2 mg total) by mouth daily. 90 tablet 3   No current facility-administered medications for this visit.     Social History   Social History  . Marital status: Married    Spouse name: N/A  . Number of children: N/A  . Years of education: N/A   Occupational History  . Not on file.   Social History Main Topics  . Smoking status: Never Smoker  . Smokeless tobacco: Never Used  .  Alcohol use 1.0 oz/week    2 drink(s) per week  . Drug use: Unknown  . Sexual activity: Not on file   Other Topics Concern  . Not on file   Social History Narrative  . No narrative on file    Additional social history is notable in that he is married has one child who is also my patient who has a history of hyperlipidemia.  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  ROS General: Negative; No fevers, chills, or night sweats;  HEENT: Negative; No changes in vision or hearing, sinus congestion, difficulty swallowing Pulmonary: Negative; No cough, wheezing, shortness of breath, hemoptysis Cardiovascular: Negative; No chest pain, presyncope, syncope, palpitations GI: Negative; No nausea, vomiting, diarrhea, or abdominal pain GU: Negative; No dysuria, hematuria, or difficulty voiding Musculoskeletal: Negative; no myalgias, joint pain, or weakness Hematologic/Oncology: Negative; no easy bruising, bleeding Endocrine: Negative; no heat/cold intolerance; no diabetes Neuro: Negative; no changes in balance, headaches Skin: Negative; No rashes or skin lesions Psychiatric: Negative; No behavioral problems, depression Sleep: Negative; No snoring, daytime sleepiness, hypersomnolence, bruxism, restless legs, hypnogognic hallucinations, no cataplexy Other comprehensive 14 point system review is negative.   PE BP 123/80   Pulse 60   Ht 5' 6"  (1.676 m)   Wt 157 lb 12.8 oz (71.6 kg)   BMI 25.47 kg/m   Repeat blood pressure by me was 120/75.  Wt Readings from Last 3 Encounters:  07/03/16 157 lb 12.8 oz (71.6 kg)  06/17/15 156 lb 14.4 oz (71.2 kg)  10/13/14 160 lb (72.6 kg)  General: Alert, oriented, no distress.  Skin: normal turgor, no rashes HEENT: Normocephalic, atraumatic. Pupils round and reactive; sclera anicteric;no lid lag.  Nose without nasal septal hypertrophy Mouth/Parynx benign; Mallinpatti scale 2 Neck: No JVD, no carotid bruits with normal carotid upstroke Chest wall:  No tenderness to palpation Lungs: clear to ausculatation and percussion; no wheezing or rales Heart: RRR, s1 s2 normal 1/6 sem; no diastolic murmur.  No rubs thrills or heaves Abdomen: soft, nontender; no hepatosplenomehaly, BS+; abdominal aorta nontender and not dilated by palpation. Back: no CVA tenderness Pulses 2+ Extremities: no clubbing cyanosis or edema, Homan's sign negative  Neurologic: grossly nonfocal Psychological: Normal affect and mood  ECG (independently read by me):  Normal sinus rhythm at 67 bpm.  No ectopy.  Q wave in lead 3 and aVF with preserved R waves.  January 2016ECG: (Independently read by me): Normal sinus rhythm at 72 bpm.  No ectopy.  Normal  intervals.  January 2015 ECG: (Independently read by me): Normal sinus rhythm at 71 beats per minute. No ST changes. Normal intervals.   LABS: BMP Latest Ref Rng & Units 06/29/2016 06/03/2015 06/07/2014  Glucose 65 - 99 mg/dL 103(H) 105(H) 103(H)  BUN 7 - 25 mg/dL 27(H) 27(H) 26(H)  Creatinine 0.70 - 1.18 mg/dL 1.17 1.16 1.09  Sodium 135 - 146 mmol/L 141 139 140  Potassium 3.5 - 5.3 mmol/L 4.4 4.1 4.7  Chloride 98 - 110 mmol/L 107 107 107  CO2 20 - 31 mmol/L 26 25 26   Calcium 8.6 - 10.3 mg/dL 9.7 9.1 9.8   Hepatic Function Latest Ref Rng & Units 06/29/2016 06/03/2015 06/07/2014  Total Protein 6.1 - 8.1 g/dL 6.9 6.3 6.6  Albumin 3.6 - 5.1 g/dL 4.4 4.1 4.3  AST 10 - 35 U/L 25 19 24   ALT 9 - 46 U/L 35 25 35  Alk Phosphatase 40 - 115 U/L 48 37(L) 42  Total Bilirubin 0.2 - 1.2 mg/dL 0.7 0.5 0.5   CBC Latest Ref Rng & Units 06/29/2016 06/03/2015 06/07/2014  WBC 3.8 - 10.8 K/uL 5.9 5.6 5.7  Hemoglobin 13.2 - 17.1 g/dL 13.6 13.4 13.6  Hematocrit 38.5 - 50.0 % 40.5 39.7 42.0  Platelets 140 - 400 K/uL 167 146(L) 144(L)   Lab Results  Component Value Date   MCV 90.2 06/29/2016   MCV 89.4 06/03/2015   MCV 90.9 06/07/2014    No results found for: HGBA1C  Lab Results  Component Value Date   TSH 2.165 06/03/2015    Lipid Panel     Component Value Date/Time   CHOL 146 06/29/2016 1021   TRIG 130 06/29/2016 1021   HDL 51 06/29/2016 1021   CHOLHDL 2.9 06/29/2016 1021   VLDL 26 06/29/2016 1021   LDLCALC 69 06/29/2016 1021     RADIOLOGY: No results found.  IMPRESSION:  1. Hyperlipidemia, unspecified hyperlipidemia type   2. Essential hypertension, benign     ASSESSMENT AND PLAN: Mr. Morton is a 76 year old gentleman who has a history of hypertension and hyperlipidemia. He has lost approximately 20 pounds over the last 7 years.  He has remained active and denies chest pain or shortness of breath.  He continues to do well and exercises regularly. He is tolerating combination therapy with Zetia 10 mg and Livalo 2 mg daily .  Most recent lipid studies are excellent.  His blood pressure today is stable on lisinopril 5 mg.  He will be seen Dr. [name] later today.  He has a history of hemorrhoids.  His wife is concerned that he may be developing some mild short-term memory loss and was concerned about his medications, potentially causing this.  He is now taking over-the-counter Prevagen.  If his symptoms progress, I have suggested neurologic evaluation.  Cardiac standpoint he is stable.  I will see him in one year for reevaluation.  Troy Sine, MD, [facility]  07/05/2016 2:17 PM

## 2016-07-09 ENCOUNTER — Telehealth: Payer: Self-pay | Admitting: Cardiovascular Disease

## 2016-07-09 DIAGNOSIS — E785 Hyperlipidemia, unspecified: Secondary | ICD-10-CM

## 2016-07-09 DIAGNOSIS — Z79899 Other long term (current) drug therapy: Secondary | ICD-10-CM

## 2016-07-09 NOTE — Telephone Encounter (Signed)
Pt says Carlos Pierce had called all his medicine in.He said the pharmacist said all but one medicine was filled. He said they would not tell him which medicine it was.They said the nurse would have to call and find out this information.

## 2016-07-09 NOTE — Telephone Encounter (Signed)
Lisinopril, Zetia, Livalo refilled for 90 day supply with 3 refills on 07/03/16 Patient states he was told by his pharmacy that he cannot get one of his medications He was upset, stating he has called the pharmacy like 6 times Informed him that his Livalo may need a prior authorization - he is unsure Will route to [place], CMA to follow up on this.

## 2016-07-12 ENCOUNTER — Encounter: Payer: Self-pay | Admitting: Cardiovascular Disease

## 2016-07-12 ENCOUNTER — Other Ambulatory Visit: Payer: Self-pay

## 2016-07-12 MED ORDER — LISINOPRIL 5 MG PO TABS: 5.0000 mg | ORAL_TABLET | Freq: Every day | ORAL | 3 refills | Status: DC

## 2016-07-12 MED ORDER — EZETIMIBE 10 MG PO TABS: 10.0000 mg | ORAL_TABLET | Freq: Every day | ORAL | 3 refills | Status: DC

## 2016-07-12 MED ORDER — PITAVASTATIN CALCIUM 2 MG PO TABS: 1.0000 | ORAL_TABLET | Freq: Every day | ORAL | 3 refills | Status: DC

## 2016-07-17 ENCOUNTER — Encounter: Payer: Self-pay | Admitting: Cardiovascular Disease

## 2016-07-17 NOTE — Telephone Encounter (Signed)
Spoke with [facility] regarding patients medications. The lisinopril was shipped out 06/30/16, the zetia will be shipped out in 5 days, it is too early now. The livalo is no longer on the formulary and they are requesting the patient be changed to pravastatin. The patient reports he has never taken pravastatin before. Samples of livalo placed at the front desk for patient to pick up. Will forward this to dr Claiborne Billings for decision regarding pravastatin.

## 2016-07-18 ENCOUNTER — Telehealth: Payer: Self-pay | Admitting: Cardiovascular Disease

## 2016-07-18 DIAGNOSIS — N400 Enlarged prostate without lower urinary tract symptoms: Secondary | ICD-10-CM | POA: Diagnosis not present

## 2016-07-18 DIAGNOSIS — R972 Elevated prostate specific antigen [PSA]: Secondary | ICD-10-CM | POA: Diagnosis not present

## 2016-07-18 NOTE — Telephone Encounter (Signed)
New Message  Pt c/o medication issue:  1. Name of Medication:Pitavastin Calcium (Livalo) 2 mg tablet once daily   2. How are you currently taking this medication (dosage and times per day)? See above  3. Are you having a reaction (difficulty breathing--STAT)? N/A  4. What is your medication issue? pravastatin is the alternative/replacement drug for livalo. Pharmacy calling due to Livalo is discontinued.  Please f/u

## 2016-07-18 NOTE — Telephone Encounter (Signed)
Spoke to representative  Livalo needs prior authorization  Reviewing patient patient CHL'S CHART Patient had been taken crestor in the past - stopped @ 2013 due to increase transamine  Levels ( SGPT 115, SGOT 41)  Patient is still taking zetia, CoQ10, OMEGA 3 FATTY ACIDS,  Representative states patient - must have  tried another statin -simvastatin,lovastatin,atorvstatin 1 Forward to--- for starting prior authorization-1 [phone]

## 2016-07-19 ENCOUNTER — Encounter: Payer: Self-pay | Admitting: *Deleted

## 2016-07-23 ENCOUNTER — Encounter: Payer: Self-pay | Admitting: Diagnostic Neuroimaging

## 2016-07-23 ENCOUNTER — Ambulatory Visit (INDEPENDENT_AMBULATORY_CARE_PROVIDER_SITE_OTHER): Payer: Medicare Other | Admitting: Diagnostic Neuroimaging

## 2016-07-23 VITALS — BP 118/71 | HR 64 | Ht 66.0 in | Wt 157.2 lb

## 2016-07-23 DIAGNOSIS — I10 Essential (primary) hypertension: Secondary | ICD-10-CM | POA: Diagnosis not present

## 2016-07-23 DIAGNOSIS — R0683 Snoring: Secondary | ICD-10-CM

## 2016-07-23 DIAGNOSIS — R413 Other amnesia: Secondary | ICD-10-CM

## 2016-07-23 NOTE — Progress Notes (Signed)
[facility]  PATIENT: Carlos Pierce DOB: [birth year]  REFERRING CLINICIAN: [name] / Tollie Pizza HISTORY FROM: patient  REASON FOR VISIT: new consult    HISTORICAL  CHIEF COMPLAINT:  Chief Complaint  Patient presents with  . Evaluation for possible dementia    rm 7, New Pt,  wifeManuela Schwartz, MMSE  29    HISTORY OF PRESENT ILLNESS:   76 year old right-handed male here for evaluation of memory loss.   Patient reports 1-2 months of mild short-term memory loss, losing track of his train of thought, word finding difficulties, difficulty with name recall, forgetting how to stay on task. 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 Otherwise he is able to maintain all of his activities of daily living. He takes care of paying the bills and finances at home. He does light chores around the house.  Patient's wife reports at least one year of duration of the symptoms. She also notes that he is having more trouble with driving directions, recall of people's names, telling a story and then getting off track at the end.   Patient is otherwise very active physically. He exercises 4-5 times per week.   REVIEW OF SYSTEMS: Full 14 system review of systems performed and negative with exception of: Snoring memory loss hearing loss.   ALLERGIES: No Known Allergies  HOME MEDICATIONS: Outpatient Medications Prior to Visit  Medication Sig Dispense Refill  . Apoaequorin (PREVAGEN PO) Take 20 mg by mouth daily.    Marland Kitchen aspirin 81 MG tablet Take 81 mg by mouth daily.    . ASTRAGALUS PO Take 400 mg by mouth daily.    Raelyn Ensign Pollen 1000 MG TABS Take 1 tablet by mouth daily.    . Bran 500 MG TABS Take 1 tablet by mouth 2 (two) times daily.    Valente David Yeast TABS Take 1 tablet by mouth daily.    . Coenzyme Q10 (CO Q 10 PO) Take 300 mg by mouth daily.    Marland Kitchen ezetimibe (ZETIA) 10 MG tablet Take 1 tablet (10 mg total) by mouth daily. 90 tablet 3  . finasteride (PROSCAR) 5 MG tablet  Take 1 tablet by mouth daily.    Marland Kitchen L-Tyrosine 500 MG CAPS Take 1 capsule by mouth daily.    Marland Kitchen lisinopril (PRINIVIL,ZESTRIL) 5 MG tablet Take 1 tablet (5 mg total) by mouth daily. 90 tablet 3  . Milk Thistle 1000 MG CAPS Take 1 capsule by mouth daily.    . Multiple Vitamins-Minerals (MULTIVITAMIN & MINERAL PO) Take 1 tablet by mouth daily.    . Omega-3 Fatty Acids (OMEGA 3 PO) Take 1,000 mg by mouth daily.    . Pitavastatin Calcium (LIVALO) 2 MG TABS Take 1 tablet (2 mg total) by mouth daily. 90 tablet 3  . Multiple Vitamin (MULTI-VITAMIN DAILY PO) Take by mouth.    . fluticasone (FLONASE) 50 MCG/ACT nasal spray Place 1 spray into both nostrils daily.    . Ginkgo Biloba 40 MG TABS Take 240 mg by mouth daily.    . Lutein-Zeaxanthin 25-5 MG CAPS Take 1 capsule by mouth daily.     No facility-administered medications prior to visit.     PAST MEDICAL HISTORY: No past medical history on file.  PAST SURGICAL HISTORY: Past Surgical History:  Procedure Laterality Date  . CATARACT EXTRACTION Bilateral   . HEMORRHOID BANDING  2016    FAMILY HISTORY: No family history on file.  SOCIAL HISTORY:  Social History  Social History  . Marital status: Married    Spouse name: [name]  . Number of children: 1  . Years of education: 40   Occupational History  .      [employer]   Social History Main Topics  . Smoking status: Never Smoker  . Smokeless tobacco: Never Used  . Alcohol use 1.0 oz/week    2 Standard drinks or equivalent per week     Comment: wine a few days a week  . Drug use: No  . Sexual activity: Not on file   Other Topics Concern  . Not on file   Social History Narrative  . No narrative on file     PHYSICAL EXAM  GENERAL EXAM/CONSTITUTIONAL: Vitals:  Vitals:   07/23/16 0856  BP: 118/71  Pulse: 64  Weight: 157 lb 3.2 oz (71.3 kg)  Height: 5\' 6"  (1.676 m)     Body mass index is 25.37 kg/m.  Visual Acuity Screening   Right eye Left eye Both  eyes  Without correction:     With correction: 20/40 20/50   Comments: bifocals    Patient is in no distress; well developed, nourished and groomed; neck is supple  CARDIOVASCULAR:  Examination of carotid arteries is normal; no carotid bruits  Regular rate and rhythm, no murmurs  Examination of peripheral vascular system by observation and palpation is normal  EYES:  Ophthalmoscopic exam of optic discs and posterior segments is normal; no papilledema or hemorrhages  MUSCULOSKELETAL:  Gait, strength, tone, movements noted in Neurologic exam below  NEUROLOGIC: MENTAL STATUS:  MMSE - Mini Mental State Exam 07/23/2016  Orientation to time 5  Orientation to Place 4  Registration 3  Attention/ Calculation 5  Recall 2  Language- name 2 objects 2  Language- repeat 1  Language- follow 3 step command 3  Language- read & follow direction 1  Write a sentence 1  Copy design 1  Total score 28    awake, alert, oriented to person, place and time  recent and remote memory intact  normal attention and concentration  language fluent, comprehension intact, naming intact,   fund of knowledge appropriate  CRANIAL NERVE:   2nd - no papilledema on fundoscopic exam  2nd, 3rd, 4th, 6th - pupils equal and reactive to light, visual fields full to confrontation, extraocular muscles intact, no nystagmus  5th - facial sensation symmetric  7th - facial strength symmetric  8th - hearing intact  9th - palate elevates symmetrically, uvula midline  11th - shoulder shrug symmetric  12th - tongue protrusion midline  MOTOR:   normal bulk and tone, full strength in the BUE, BLE  SENSORY:   normal and symmetric to light touch, temperature, vibration  COORDINATION:   finger-nose-finger, fine finger movements normal  REFLEXES:   deep tendon reflexes present and symmetric  GAIT/STATION:   narrow based gait; able to walk tandem; romberg is negative    DIAGNOSTIC DATA  (LABS, IMAGING, TESTING) - I reviewed patient records, labs, notes, testing and imaging myself where available.  Lab Results  Component Value Date   WBC 5.9 06/29/2016   HGB 13.6 06/29/2016   HCT 40.5 06/29/2016   MCV 90.2 06/29/2016   PLT 167 06/29/2016      Component Value Date/Time   NA 141 06/29/2016 1021   K 4.4 06/29/2016 1021   CL 107 06/29/2016 1021   CO2 26 06/29/2016 1021   GLUCOSE 103 (H) 06/29/2016 1021   BUN 27 (H)  06/29/2016 1021   CREATININE 1.17 06/29/2016 1021   CALCIUM 9.7 06/29/2016 1021   PROT 6.9 06/29/2016 1021   ALBUMIN 4.4 06/29/2016 1021   AST 25 06/29/2016 1021   ALT 35 06/29/2016 1021   ALKPHOS 48 06/29/2016 1021   BILITOT 0.7 06/29/2016 1021   Lab Results  Component Value Date   CHOL 146 06/29/2016   HDL 51 06/29/2016   LDLCALC 69 06/29/2016   TRIG 130 06/29/2016   CHOLHDL 2.9 06/29/2016   No results found for: HGBA1C No results found for: VITAMINB12 Lab Results  Component Value Date   TSH 2.165 06/03/2015    10/13/14 MRI brain [I reviewed images myself and agree with interpretation. -VRP]  1. No acute or focal lesion to explain left-sided hearing loss. 2. Right mastoid effusion. No obstructing nasopharyngeal lesion is evident.     ASSESSMENT AND PLAN  76 y.o. year old male here with mild memory loss x 1 year, mainly short term, recall, names, directions.    Ddx: mild cognitive impairement vs prodromal dementia vs sleep apnea cognitive impairement vs other secondary causes  1. Memory loss   2. Snoring   3. Essential hypertension      PLAN: - MRI brain, labs for dementia evaluation - sleep study evaluation (snoring, HTN, memory loss) - consider donepezil or memantine in future - consider neuropsychology evaluation - consider hearing aid evaluation - consider clinical research study - brain healthy activities reviewed  Orders Placed This Encounter  Procedures  . MR BRAIN WO CONTRAST  . TSH  . Vitamin B12  .  Ambulatory referral to Sleep Studies   Return in about 3 months (around 10/20/2016).    Penni Bombard, MD [id number], [id number] AM Certified in Neurology, Neurophysiology and Neuroimaging  [facility] [address] [place], [place] [address] [phone]

## 2016-07-23 NOTE — Patient Instructions (Signed)
Thank you for coming to see [place] at [facility]. I hope we have been able to provide you high quality care today.  You may receive a patient satisfaction survey over the next few weeks. We would appreciate your feedback and comments so that we may continue to improve ourselves and the health of our patients.  - MRI brain, labs for dementia evaluation - sleep study evaluation (snoring, HTN, memory loss) - consider donepezil or memantine in future - consider neuropsychology evaluation - consider hearing aid evaluation - consider clinical research study - brain healthy activities reviewed   ~~~~~~~~~~~~~~~~~~~~~~~~~~~~~~~~~~~~~~~~~~~~~~~~~~~~~~~~~~~~~~~~~  DR. PENUMALLI'S GUIDE TO HAPPY AND HEALTHY LIVING These are some of my general health and wellness recommendations. Some of them may apply to you better than others. Please use common sense as you try these suggestions and feel free to ask me any questions.   ACTIVITY/FITNESS Mental, social, emotional and physical stimulation are very important for brain and body health. Try learning a new activity (arts, music, language, sports, games).  Keep moving your body to the best of your abilities. You can do this at home, inside or outside, the park, community center, gym or anywhere you like. Consider a physical therapist or personal trainer to get started. Consider the app Sworkit. Fitness trackers such as smart-watches, smart-phones or Fitbits can help as well.   NUTRITION Eat more plants: colorful vegetables, nuts, seeds and berries.  Eat less sugar, salt, preservatives and processed foods.  Avoid toxins such as cigarettes and alcohol.  Drink water when you are thirsty. Warm water with a slice of lemon is an excellent morning drink to start the day.  Consider these websites for more information The Nutrition Source (https://www.henry-hernandez.biz/) [employer] Nutrition  (WindowBlog.ch)   RELAXATION Consider practicing mindfulness meditation or other relaxation techniques such as deep breathing, prayer, yoga, tai chi, massage. See website mindful.org or the apps Headspace or Calm to help get started.   SLEEP Try to get at least 7-8+ hours sleep per day. Regular exercise and reduced caffeine will help you sleep better. Practice good sleep hygeine techniques. See website sleep.org for more information.   PLANNING Prepare estate planning, living will, healthcare POA documents. Sometimes this is best planned with the help of an attorney. Theconversationproject.org and agingwithdignity.org are excellent resources.

## 2016-07-24 LAB — TSH: TSH: 2.79 u[IU]/mL (ref 0.450–4.500)

## 2016-07-24 LAB — VITAMIN B12: Vitamin B-12: 815 pg/mL (ref 232–1245)

## 2016-07-25 ENCOUNTER — Telehealth: Payer: Self-pay | Admitting: *Deleted

## 2016-07-25 NOTE — Telephone Encounter (Signed)
Per Dr [name], LVM informing patient his lab results, thyroid and  Vit B12 level  are normal. Left number for any questions.

## 2016-07-26 NOTE — Telephone Encounter (Signed)
Can try prvastatin 40 mg , but he may not be able to tolerate since he was prescribed livalo.

## 2016-07-27 ENCOUNTER — Telehealth: Payer: Self-pay | Admitting: Cardiovascular Disease

## 2016-07-27 NOTE — Telephone Encounter (Signed)
Left message for pt to call.

## 2016-07-27 NOTE — Telephone Encounter (Signed)
Per telephone note 07/18/16 - medication needs prior authorization. Routed to [place], [place]

## 2016-07-27 NOTE — Telephone Encounter (Signed)
New Message    Why has this medication not been filled? He is returning your call about this medication  Pitavastatin Calcium (LIVALO) 2 MG TABS Take 1 tablet (2 mg total) by mouth daily.   Please call after 10 am!

## 2016-07-27 NOTE — Telephone Encounter (Signed)
Returned call to patient no answer.LMTC. 

## 2016-07-30 DIAGNOSIS — Z79899 Other long term (current) drug therapy: Secondary | ICD-10-CM | POA: Insufficient documentation

## 2016-07-30 MED ORDER — PRAVASTATIN SODIUM 40 MG PO TABS: 40.0000 mg | ORAL_TABLET | Freq: Every evening | ORAL | 3 refills | Status: DC

## 2016-07-30 NOTE — Telephone Encounter (Signed)
Spoke to patient.    informed  Patient will discontinue livalo  - change to pravastatin 40 mgAt bedtime- labs slip mailed for labs in 2 months.  ( due to not being on formulary anymore would have to try  Another statin,)

## 2016-08-07 ENCOUNTER — Ambulatory Visit
Admission: RE | Admit: 2016-08-07 | Discharge: 2016-08-07 | Disposition: A | Discharge status: Home / Self Care | Payer: Medicare Other | Source: Ambulatory Visit | Attending: Diagnostic Neuroimaging | Admitting: Diagnostic Neuroimaging

## 2016-08-07 DIAGNOSIS — R413 Other amnesia: Secondary | ICD-10-CM

## 2016-08-07 DIAGNOSIS — I10 Essential (primary) hypertension: Secondary | ICD-10-CM

## 2016-08-07 DIAGNOSIS — R0683 Snoring: Secondary | ICD-10-CM

## 2016-08-09 NOTE — Telephone Encounter (Signed)
This has been taken care of see 07/30/16 phone note.

## 2016-08-16 ENCOUNTER — Telehealth: Payer: Self-pay | Admitting: *Deleted

## 2016-08-16 ENCOUNTER — Ambulatory Visit (INDEPENDENT_AMBULATORY_CARE_PROVIDER_SITE_OTHER): Payer: Medicare Other | Admitting: Neurology

## 2016-08-16 ENCOUNTER — Encounter: Payer: Self-pay | Admitting: Neurology

## 2016-08-16 VITALS — BP 130/82 | HR 76 | Resp 16 | Ht 66.0 in | Wt 156.0 lb

## 2016-08-16 DIAGNOSIS — G479 Sleep disorder, unspecified: Secondary | ICD-10-CM | POA: Diagnosis not present

## 2016-08-16 DIAGNOSIS — R413 Other amnesia: Secondary | ICD-10-CM | POA: Diagnosis not present

## 2016-08-16 DIAGNOSIS — G4761 Periodic limb movement disorder: Secondary | ICD-10-CM | POA: Diagnosis not present

## 2016-08-16 DIAGNOSIS — R0683 Snoring: Secondary | ICD-10-CM | POA: Diagnosis not present

## 2016-08-16 DIAGNOSIS — R400 Somnolence: Secondary | ICD-10-CM

## 2016-08-16 DIAGNOSIS — G2581 Restless legs syndrome: Secondary | ICD-10-CM

## 2016-08-16 NOTE — Patient Instructions (Signed)

## 2016-08-16 NOTE — Telephone Encounter (Signed)
LVM informing patient, per Dr [name], his MRI brain is stable, no major findings, and advised Dr [name] will continue with his current treatment plan. Noted that pt has appt with Dr [name] today for sleep consult. Left umber for any questions.

## 2016-08-16 NOTE — Progress Notes (Signed)
Subjective:    Patient ID: Carlos Pierce is a 76 y.o. male.  HPI     Star Age, MD, PhD [facility] [address], [address] [place], [place] [address]  Dear Carlos Pierce,  I saw your patient, Carlos Pierce, upon your kind request in my clinic today for initial consultation of his sleep disorder, in particular, concern for underlying obstructive sleep apnea. The patient is accompanied by his wife today. As you know, Carlos Pierce is a 76 year old right-handed gentleman with an underlying medical history of memory loss, Hyperlipidemia, borderline overweight state and status post cataract repairs, who reports snoring, and excessive daytime somnolence. He snores loudly per wife. He sometimes makes gasping sounds at night. I reviewed your office note from 07/23/2016. He had a brain MRI without contrast on 08/07/2016 which I reviewed:   IMPRESSION:  Abnormal MRI scan of the brain showing mild changes of chronic microvascular ischemia, generalized cerebral atrophy and mild changes of chronic paranasal sinus inflammation. Overall no significant change compared with previous MRI scan dated 10/13/2014.   His wife reports that they did not get results. I see that there was a phone call placed this morning to them for results, but they were probably already on their way to this appointment and missed that call. I told him about test results during her clinic appointment and reassured him that findings were stable from a previous MRI from May 2016 which is reassuring. He reports a longer standing history of difficulty maintaining sleep, he falls asleep almost instantly at bedtime is typically around 10 PM. He wakes up about 4 hours later and has trouble going back to sleep, feels like he's only dozing off lightly. He tosses and turns, often his wife does not sleep in the same bed with him. He endorses restless leg symptoms and wife noticed that he is quite restless  when he is watching TV sitting down, he tends to shift around and move his legs. He denies morning headaches or nocturia, his son has sleep apnea and uses a CPAP machine. He has had nocturnal leg cramps. His wake up time typically is between 6:30 and 7, he gets up without an alarm. 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 He had a tonsillectomy as a child. He drinks little caffeine, one cup of coffee per day, typically no daily sodas or tea. He tries to drink enough water, he is a non-smoker, he drinks alcohol very occasionally, once or twice per month on average. He has had difficulty with his sleep for at least 5 years. His Epworth sleepiness score is 8 out of 24, fatigue score is 35 out of 63.  His Past Medical History Is Significant For: No past medical history on file.  His Past Surgical History Is Significant For: Past Surgical History:  Procedure Laterality Date  . CATARACT EXTRACTION Bilateral   . HEMORRHOID BANDING  2016    His Family History Is Significant For: No family history on file.  His Social History Is Significant For: Social History   Social History  . Marital status: Married    Spouse name: Carlos Pierce  . Number of children: 1  . Years of education: 40   Occupational History  .      [employer]   Social History Main Topics  . Smoking status: Never Smoker  . Smokeless tobacco: Never Used  . Alcohol use 1.0 oz/week    2 Standard drinks  or equivalent per week     Comment: wine a few days a week  . Drug use: No  . Sexual activity: Not Asked   Other Topics Concern  . None   Social History Narrative   Drinks 1 cup of coffee a day     His Allergies Are:  No Known Allergies:   His Current Medications Are:  Outpatient Encounter Prescriptions as of 08/16/2016  Medication Sig  . Apoaequorin (PREVAGEN PO) Take 20 mg by mouth daily.  Marland Kitchen aspirin 81 MG tablet Take 81 mg by mouth daily.  . ASTRAGALUS PO Take 400 mg by mouth  daily.  Raelyn Ensign Pollen 1000 MG TABS Take 1 tablet by mouth daily.  . Bran 500 MG TABS Take 1 tablet by mouth 2 (two) times daily.  Valente David Yeast TABS Take 1 tablet by mouth daily.  . Coenzyme Q10 (CO Q 10 PO) Take 300 mg by mouth daily.  Marland Kitchen ezetimibe (ZETIA) 10 MG tablet Take 1 tablet (10 mg total) by mouth daily.  . finasteride (PROSCAR) 5 MG tablet Take 1 tablet by mouth daily.  . fluticasone (FLONASE) 50 MCG/ACT nasal spray Place 1 spray into both nostrils daily.  . Ginkgo Biloba 40 MG TABS Take 240 mg by mouth daily.  Marland Kitchen L-Tyrosine 500 MG CAPS Take 1 capsule by mouth daily.  Marland Kitchen lisinopril (PRINIVIL,ZESTRIL) 5 MG tablet Take 1 tablet (5 mg total) by mouth daily.  . Lutein-Zeaxanthin 25-5 MG CAPS Take 1 capsule by mouth daily.  . Milk Thistle 1000 MG CAPS Take 1 capsule by mouth daily.  . Multiple Vitamins-Minerals (MULTIVITAMIN & MINERAL PO) Take 1 tablet by mouth daily.  . Omega-3 Fatty Acids (OMEGA 3 PO) Take 1,000 mg by mouth daily.  . Pitavastatin Calcium (LIVALO) 2 MG TABS Take 1 tablet (2 mg total) by mouth daily.  . pravastatin (PRAVACHOL) 40 MG tablet Take 1 tablet (40 mg total) by mouth every evening.   No facility-administered encounter medications on file as of 08/16/2016.   :  Review of Systems:  Out of a complete 14 point review of systems, all are reviewed and negative with the exception of these symptoms as listed below:  Review of Systems  Neurological:       Patient states that he has trouble staying asleep, snores, wakes up feeling tired, daytime fatigue. Denies taking naps.    Epworth Sleepiness Scale 0= would never doze 1= slight chance of dozing 2= moderate chance of dozing 3= high chance of dozing  Sitting and reading:1 Watching TV:3 Sitting inactive in a public place (ex. Theater or meeting):0 As a passenger in a car for an hour without a break:1 Lying down to rest in the afternoon:1 Sitting and talking to someone:0 Sitting quietly after lunch (no  alcohol):2 In a car, while stopped in traffic:0 Total:8  Objective:  Neurologic Exam  Physical Exam Physical Examination:   Vitals:   08/16/16 0853  BP: 130/82  Pulse: 76  Resp: 16    General Examination: The patient is a very pleasant 76 y.o. male in no acute distress. He appears well-developed and well-nourished and very well groomed.   HEENT: Normocephalic, atraumatic, pupils are equal, round and reactive to light and accommodation. Funduscopic exam is normal with sharp disc margins noted. Extraocular tracking is good without limitation to gaze excursion or nystagmus noted. Normal smooth pursuit is noted. Hearing is grossly intact. Tympanic membranes are clear bilaterally. Face is symmetric with normal facial animation and normal facial sensation.  Speech is clear with no dysarthria noted. There is no hypophonia. There is no lip, neck/head, jaw or voice tremor. Neck is supple with full range of passive and active motion. There are no carotid bruits on auscultation. Oropharynx exam reveals: mild mouth dryness, adequate dental hygiene and moderate airway crowding, due to redundant soft palate and smaller airway entry. Mallampati is class II. Tongue protrudes centrally and palate elevates symmetrically. Tonsils are absent. Neck size is 16.25 inches. He has a Moderate overbite.   Chest: Clear to auscultation without wheezing, rhonchi or crackles noted.  Heart: S1+S2+0, regular and normal without murmurs, rubs or gallops noted.   Abdomen: Soft, non-tender and non-distended with normal bowel sounds appreciated on auscultation.  Extremities: There is no pitting edema in the distal lower extremities bilaterally. Pedal pulses are intact.  Skin: Warm and dry without trophic changes noted.  Musculoskeletal: exam reveals no obvious joint deformities, tenderness or joint swelling or erythema.   Neurologically:  Mental status: The patient is awake, alert and oriented in all 4 spheres. His  immediate and remote memory, attention, language skills and fund of knowledge are fairly appropriate. There is no evidence of aphasia, agnosia, apraxia or anomia. Speech is clear with normal prosody and enunciation. Thought process is linear. Mood is normal and affect is normal.  Cranial nerves II - XII are as described above under HEENT exam. In addition: shoulder shrug is normal with equal shoulder height noted. Motor exam: Normal bulk, strength and tone is noted. There is no drift, tremor or rebound. Romberg is negative. Reflexes are 1+ throughout. Fine motor skills and coordination: intact with normal finger taps, normal hand movements, normal rapid alternating patting, normal foot taps and normal foot agility.  Cerebellar testing: No dysmetria or intention tremor on finger to nose testing. Heel to shin is unremarkable bilaterally. There is no truncal or gait ataxia.  Sensory exam: intact to light touch in the upper and lower extremities.  Gait, station and balance: He stands easily. No veering to one side is noted. No leaning to one side is noted. Posture is age-appropriate and stance is narrow based. Gait shows normal stride length and normal pace. No problems turning are noted. Tandem walk is challenging for him.                Assessment and plan:  In summary, Carlos Pierce is a very pleasant 76 y.o.-year old male with an underlying medical history of memory loss, Hyperlipidemia, borderline overweight state and status post cataract repairs, whose history and physical exam are concerning for obstructive sleep apnea (OSA), as he reports loud snoring, sleep disruption, nonrestorative sleep, and in light of his memory complaints sleep study testing is indicated and justified. His son has obstructive sleep apnea and uses a CPAP machine. The patient also endorses intermittent restless leg symptoms and his wife endorses that he moves in his sleep. He reports that he has woken up at times in a  perpendicular position to how he started sleeping.  I had a long chat with the patient and his wife about my findings and the diagnosis of OSA, its prognosis and treatment options. We talked about medical treatments, surgical interventions and non-pharmacological approaches. I explained in particular the risks and ramifications of untreated moderate to severe OSA, especially with respect to developing cardiovascular disease down the Road, including congestive heart failure, difficult to treat hypertension, cardiac arrhythmias, or stroke. Even type 2 diabetes has, in part, been linked to untreated OSA. Symptoms of  untreated OSA include daytime sleepiness, memory problems, mood irritability and mood disorder such as depression and anxiety, lack of energy, as well as recurrent headaches, especially morning headaches. We talked about trying to maintain a healthy lifestyle in general, as well as the importance of weight control. I encouraged the patient to eat healthy, exercise daily and keep well hydrated, to keep a scheduled bedtime and wake time routine, to not skip any meals and eat healthy snacks in between meals. I advised the patient not to drive when feeling sleepy. I recommended the following at this time: sleep study with potential positive airway pressure titration. (We will score hypopneas at 4%).   I explained the sleep test procedure to the patient and also outlined possible surgical and non-surgical treatment options of OSA, including the use of a custom-made dental device (which would require a referral to a specialist dentist or oral surgeon), upper airway surgical options, such as pillar implants, radiofrequency surgery, tongue base surgery, and UPPP (which would involve a referral to an ENT surgeon). Rarely, jaw surgery such as mandibular advancement may be considered.  I also explained the CPAP treatment option to the patient, who indicated that he would be willing to try CPAP if the need arises.  I explained the importance of being compliant with PAP treatment, not only for insurance purposes but primarily to improve His symptoms, and for the patient's long term health benefit, including to reduce His cardiovascular risks. I answered all their questions today and the patient and his wife were in agreement. I would like to see him back after the sleep study is completed and encouraged him to call with any interim questions, concerns, problems or updates.   Thank you very much for allowing me to participate in the care of this nice patient. If I can be of any further assistance to you please do not hesitate to talk to me.   Sincerely,   Star Age, MD, PhD

## 2016-09-02 ENCOUNTER — Ambulatory Visit (INDEPENDENT_AMBULATORY_CARE_PROVIDER_SITE_OTHER): Payer: Medicare Other | Admitting: Neurology

## 2016-09-02 DIAGNOSIS — G4761 Periodic limb movement disorder: Secondary | ICD-10-CM

## 2016-09-02 DIAGNOSIS — G4733 Obstructive sleep apnea (adult) (pediatric): Secondary | ICD-10-CM | POA: Diagnosis not present

## 2016-09-02 DIAGNOSIS — G4720 Circadian rhythm sleep disorder, unspecified type: Secondary | ICD-10-CM

## 2016-09-05 NOTE — Progress Notes (Signed)
Patient referred by Dr. [name], seen by me on 08/16/16, diagnostic PSG on 09/02/16.    Please call and notify the patient that the recent sleep study did confirm the diagnosis of obstructive sleep apnea with desaturations as low as 78%, and given his medical hx and c/o memory loss, I recommend treatment for this in the form of CPAP. This will require a repeat sleep study for proper titration and mask fitting. Please explain to patient and arrange for a CPAP titration study. I have placed an order in the chart. Thanks, and please route to [facility] for scheduling next sleep study.  Star Age, MD, PhD [facility])

## 2016-09-05 NOTE — Addendum Note (Signed)
Addended by: Star Age on: 09/05/2016 08:13 AM   Modules accepted: Orders

## 2016-09-05 NOTE — Procedures (Signed)
PATIENT'S NAME:  Carlos Pierce, Carlos Pierce DOB:      05-16-1941      MR#:    [id number]     DATE OF RECORDING: 09/02/2016 REFERRING M.D.:  Juanita Craver MD Study Performed:   Baseline Polysomnogram HISTORY: 76 year old man with a history of memory loss, Hyperlipidemia, borderline overweight state and status post cataract repairs, who reports snoring, and excessive daytime somnolence. He snores loudly per wife. He sometimes makes gasping sounds at night. He also endorses RLS symptoms. The patient endorsed the Epworth Sleepiness Scale at 8/24 points. The patient's weight 156 pounds with a height of 66 (inches), resulting in a BMI of 25.2 kg/m2. The patient's neck circumference measured 16.2 inches.  CURRENT MEDICATIONS: Prevagen, Aspirin, Astragalus, Bee Pollen, Bran, Yeast, Q10, Zetia, Proscar, Flonase, Ginko Biloba, L-Tyrosine, Prinivil, Lutein-Zeaxanthin, Milk Thistle, Multivitamin, Omega 3, Pitavastatin Calcium, Pravachol   PROCEDURE:  This is a multichannel digital polysomnogram utilizing the Somnostar 11.2 system.  Electrodes and sensors were applied and monitored per AASM Specifications.   EEG, EOG, Chin and Limb EMG, were sampled at 200 Hz.  ECG, Snore and Nasal Pressure, Thermal Airflow, Respiratory Effort, CPAP Flow and Pressure, Oximetry was sampled at 50 Hz. Digital video and audio were recorded.      BASELINE STUDY  Lights Out was at 22:13 and Lights On at 05:05.  Total recording time (TRT) was 412.5 minutes, with a total sleep time (TST) of  357 minutes.   The patient's sleep latency was 12 minutes.  REM latency was 61 minutes, which is mildly reduced.  The sleep efficiency was 86.5 %.     SLEEP ARCHITECTURE: WASO (Wake after sleep onset) was 42.5 minutes with moderate sleep fragmentation noted.  There were 10.5 minutes in Stage N1, 294.5 minutes Stage N2, 0 minutes Stage N3 and 52 minutes in Stage REM.  The percentage of Stage N1 was 2.9%, Stage N2 was 82.5%, which is markedly increased Stage  N3 was absent and Stage R (REM sleep) was 14.6%, which is reduced.   The arousals were noted as: 52 were spontaneous, 4 were associated with PLMs, 16 were associated with respiratory events.  Audio and video analysis did not show any abnormal or unusual movements, behaviors, phonations or vocalizations.  The patient took no bathroom breaks. Mild to moderate snoring was noted. The EKG was in keeping with normal sinus rhythm (NSR).  RESPIRATORY ANALYSIS:  There were a total of 43 respiratory events:  4 obstructive apneas, 0 central apneas and 0 mixed apneas with a total of 4 apneas and an apnea index (AI) of .7 /hour. There were 39 hypopneas with a hypopnea index of 6.6 /hour. The patient also had 14 respiratory event related arousals (RERAs).      The total APNEA/HYPOPNEA INDEX (AHI) was 7.2/hour and the total RESPIRATORY DISTURBANCE INDEX was 9.6 /hour.  11 events occurred in REM sleep and 56 events in NREM. The REM AHI was 12.7 /hour, versus a non-REM AHI of 6.3. The patient spent 46 minutes of total sleep time in the supine position and 311 minutes in non-supine.. The supine AHI was 0.0 versus a non-supine AHI of 8.3.  OXYGEN SATURATION & C02: The Wake baseline 02 saturation was 98%, with the lowest being 78%. Time spent below 89% saturation equaled 5 minutes.  PERIODIC LIMB MOVEMENTS: The patient had a total of 262 Periodic Limb Movements.  The Periodic Limb Movement (PLM) index was 44. and the PLM Arousal index was .7/hour.     Post-study, the patient  indicated that sleep was the same as usual.   IMPRESSION: 1. Obstructive Sleep Apnea (OSA) 2. Periodic Limb Movement Disorder (PLMD) 3. Dysfunctions associated with sleep stages or arousal from sleep  RECOMMENDATIONS:  1. This study demonstrates overall mild obstructive sleep apnea, with a total AHI of 7.2/hour, REM AHI of 12.7/hour, and O2 nadir of 78%. Given the patient's medical history and sleep related complaints, as well as  desaturations into the 80s and as low as 78%, treatment with positive airway pressure in the form of CPAP is recommended; this will require a full night titration study to optimize therapy. Other treatment options, generally speaking, may include avoidance of supine sleep position along with weight loss, upper airway or jaw surgery in selected patients or the use of an oral appliance in certain patients. ENT evaluation and/or consultation with a maxillofacial surgeon or dentist may be feasible in some instances.    2. Moderate PLMs (periodic limb movements of sleep) were noted during the study with minimal arousals; clinical correlation is recommended. 3. This study shows sleep fragmentation and abnormal sleep stage percentages; these are nonspecific findings and per se do not signify an intrinsic sleep disorder or a cause for the patient's sleep-related symptoms. Causes include (but are not limited to) the first night effect of the sleep study, circadian rhythm disturbances, medication effect or an underlying mood disorder or medical problem.  4. The patient should be cautioned not to drive, work at heights, or operate dangerous or heavy equipment when tired or sleepy. Review and reiteration of good sleep hygiene measures should be pursued with any patient. 5. The patient will be seen in follow-up by Dr. [name] at [facility] for discussion of the test results and further management strategies. The referring provider will be notified of the test results.  I certify that I have reviewed the entire raw data recording prior to the issuance of this report in accordance with the Standards of Accreditation of the American Academy of Sleep Medicine (AASM)  Star Age, MD, PhD Diplomat, American Board of Psychiatry and Neurology (Neurology and Sleep Medicine)

## 2016-09-06 ENCOUNTER — Telehealth: Payer: Self-pay

## 2016-09-06 NOTE — Telephone Encounter (Signed)
I spoke to patient and he is aware of results and recommendations. He is willing to proceed with titration study. I will send report to PCP.

## 2016-09-06 NOTE — Telephone Encounter (Signed)
-----   Message from Star Age, MD sent at 09/05/2016  8:12 AM EDT ----- Patient referred by Dr. [name], seen by me on 08/16/16, diagnostic PSG on 09/02/16.    Please call and notify the patient that the recent sleep study did confirm the diagnosis of obstructive sleep apnea with desaturations as low as 78%, and given his medical hx and c/o memory loss, I recommend treatment for this in the form of CPAP. This will require a repeat sleep study for proper titration and mask fitting. Please explain to patient and arrange for a CPAP titration study. I have placed an order in the chart. Thanks, and please route to [facility] for scheduling next sleep study.  Star Age, MD, PhD [facility])

## 2016-10-07 ENCOUNTER — Ambulatory Visit (INDEPENDENT_AMBULATORY_CARE_PROVIDER_SITE_OTHER): Payer: Medicare Other | Admitting: Neurology

## 2016-10-07 DIAGNOSIS — G4733 Obstructive sleep apnea (adult) (pediatric): Secondary | ICD-10-CM

## 2016-10-07 DIAGNOSIS — G4761 Periodic limb movement disorder: Secondary | ICD-10-CM

## 2016-10-07 DIAGNOSIS — G4720 Circadian rhythm sleep disorder, unspecified type: Secondary | ICD-10-CM

## 2016-10-09 ENCOUNTER — Telehealth: Payer: Self-pay

## 2016-10-09 NOTE — Procedures (Signed)
PATIENT'S NAME:  Carlos Pierce, [place] DOB:      1941/05/06      MR#:    [id number]     DATE OF RECORDING: 10/07/2016 REFERRING M.D.:  Juanita Craver MD Study Performed:   CPAP  Titration HISTORY: 76 year old man with a history of memory loss, hyperlipidemia, borderline overweight state and status post cataract repairs, who returns for a full night CPAP titration to treat his OSA. The patient's BMI is 25.2 kg/m2. His baseline PSG from 09/02/16 showed a total AHI of 7.2/hour, REM AHI of 12.7/hour, and O2 nadir of 78%.  Moderate PLMs (periodic limb movements of sleep) were noted during the study with minimal arousals.   CURRENT MEDICATIONS: Prevagen, Aspirin, Astragalus, Bee Pollen, Bran, Yeast, Q10, Zetia, Proscar, Flonase, Ginko Biloba, L-Tyrosine, Prinivil, Lutein-Zeaxanthin, Milk Thistle, Multivitamin, Omega 3, Pitavastatin Calcium, Pravachol  PROCEDURE:  This is a multichannel digital polysomnogram utilizing the SomnoStar 11.2 system.  Electrodes and sensors were applied and monitored per AASM Specifications.   EEG, EOG, Chin and Limb EMG, were sampled at 200 Hz.  ECG, Snore and Nasal Pressure, Thermal Airflow, Respiratory Effort, CPAP Flow and Pressure, Oximetry was sampled at 50 Hz. Digital video and audio were recorded.      The patient was fitted with a medium Eson nasal mask. CPAP was initiated at 5 cmH20 with heated humidity per AASM standards and pressure was advanced to 7 cmH20 because of hypopneas, apneas and desaturations. He had some central apneas in the beginning of the study, which dissipated as CPAP was continued. Final AHI was 0.2hour, O2 nadir 88%, non supine REM sleep achieved.   Lights Out was at 22:15 and Lights On at 04:26. Total recording time (TRT) was 371.5 minutes, with a total sleep time (TST) of 325.5 minutes. The patient's sleep latency was 12.5 minutes, REM latency was 61.5 minutes, which is slightly reduced.  The sleep efficiency was 87.6 %.    SLEEP ARCHITECTURE: WASO  (Wake after sleep onset)  was 33 minutes, which mild sleep fragmentation noted.  There were 57 minutes in Stage N1, 196.5 minutes Stage N2, 35.5 minutes Stage N3 and 36.5 minutes in Stage REM.  The percentage of Stage N1 was 17.5%, which is increased, Stage N2 was 60.4%, which is increased, Stage N3 was 10.9% and Stage R (REM sleep) was 11.2%, which is reduced. The arousals were noted as: 12 were spontaneous, 7 were associated with PLMs, 0 were associated with respiratory events.  Audio and video analysis did not show any abnormal or unusual movements, behaviors, phonations or vocalizations.   The patient took no bathroom breaks. The EKG was in keeping with normal sinus rhythm (NSR).  RESPIRATORY ANALYSIS:  There was a total of 5 respiratory events: 0 obstructive apneas, 0 central apneas and 0 mixed apneas with a total of 0 apneas and an apnea index (AI) of 0 /hour. There were 5 hypopneas with a hypopnea index of .9/hour. The patient also had 0 respiratory event related arousals (RERAs).      The total APNEA/HYPOPNEA INDEX  (AHI) was .9 /hour and the total RESPIRATORY DISTURBANCE INDEX was .9 .hour  1 events occurred in REM sleep and 4 events in NREM. The REM AHI was 1.6 /hour versus a non-REM AHI of .8 /hour.  The patient spent 0 minutes of total sleep time in the supine position and 326 minutes in non-supine. The supine AHI was 0.0, versus a non-supine AHI of 0.9.  OXYGEN SATURATION & C02:  The baseline 02 saturation was  98%, with the lowest being 85%. Time spent below 89% saturation equaled 2 minutes.  PERIODIC LIMB MOVEMENTS: The patient had a total of 106 Periodic Limb Movements. The Periodic Limb Movement (PLM) index was 19.5 and the PLM Arousal index was 1.3 /hour. Post-study, the patient indicated that sleep was better than usual.   DIAGNOSIS 1. Obstructive Sleep Apnea  2. Periodic Limb Movement Disorder  3. Dysfunctions associated with sleep stages or arousal from  sleep  PLANS/RECOMMENDATIONS: 1.This study demonstrates near complete resolution of the patient's obstructive sleep apnea with CPAP therapy. I will, therefore, start the patient on home CPAP treatment at a pressure of 7 cm via medium nasal mask with heated humidity. The patient should be reminded to be fully compliant with PAP therapy to improve sleep related symptoms and decrease long term cardiovascular risks. The patient should be reminded, that it may take up to 3 months to get fully used to using PAP with all planned sleep. The earlier full compliance is achieved, the better long term compliance tends to be. Please note that untreated obstructive sleep apnea carries additional perioperative morbidity. Patients with significant obstructive sleep apnea should receive perioperative PAP therapy and the surgeons and particularly the anesthesiologist should be informed of the diagnosis and the severity of the sleep disordered breathing. 2. Mild PLMs (periodic limb movements of sleep) were noted during the study with minimal arousals; clinical correlation is recommended. 3. This study shows sleep fragmentation and abnormal sleep stage percentages; these are nonspecific findings and per se do not signify an intrinsic sleep disorder or a cause for the patient's sleep-related symptoms. Causes include (but are not limited to) the first night effect of the sleep study, circadian rhythm disturbances, medication effect or an underlying mood disorder or medical problem.  4. The patient should be cautioned not to drive, work at heights, or operate dangerous or heavy equipment when tired or sleepy. Review and reiteration of good sleep hygiene measures should be pursued with any patient. 5. The patient will be seen in follow-up by Dr. [name] at [facility] for discussion of the test results and further management strategies. The referring provider will be notified of the test results.   I certify that I have reviewed the entire  raw data recording prior to the issuance of this report in accordance with the Standards of Accreditation of the American Academy of Sleep Medicine (AASM)   Star Age, MD, PhD Diplomat, American Board of Psychiatry and Neurology (Neurology and Sleep Medicine)

## 2016-10-09 NOTE — Telephone Encounter (Signed)
-----   Message from Star Age, MD sent at 10/09/2016  8:12 AM EDT ----- Patient referred by Dr. [name], seen by me on 08/16/16, diagnostic PSG on 09/02/16, CPAP study on 10/07/16.   Please call and inform patient that I have entered an order for treatment with positive airway pressure (PAP) treatment of obstructive sleep apnea (OSA). He did well during the latest sleep study with CPAP. We will, therefore, arrange for a machine for home use through a DME (durable medical equipment) company of His choice; and I will see the patient back in follow-up in about 10 weeks. Please also explain to the patient that I will be looking out for compliance data, which can be downloaded from the machine (stored on an SD card, that is inserted in the machine) or via remote access through a modem, that is built into the machine. At the time of the followup appointment we will discuss sleep study results and how it is going with PAP treatment at home. Please advise patient to bring His machine at the time of the first FU visit, even though this is cumbersome. Bringing the machine for every visit after that will likely not be needed, but often helps for the first visit to troubleshoot if needed. Please re-enforce the importance of compliance with treatment and the need for [place] to monitor compliance data - often an insurance requirement and actually good feedback for the patient as far as how they are doing.  Also remind patient, that any interim PAP machine or mask issues should be first addressed with the DME company, as they can often help better with technical and mask fit issues. Please ask if patient has a preference regarding DME company.  Please also make sure, the patient has a follow-up appointment with me in about 10 weeks from the setup date, thanks.  Once you have spoken to the patient - and faxed/routed report to PCP and referring MD (if other than PCP), you can close this encounter, thanks,   Star Age, MD,  PhD [facility] ([place])

## 2016-10-09 NOTE — Progress Notes (Signed)
Patient referred by Dr. [name], seen by me on 08/16/16, diagnostic PSG on 09/02/16, CPAP study on 10/07/16.   Please call and inform patient that I have entered an order for treatment with positive airway pressure (PAP) treatment of obstructive sleep apnea (OSA). He did well during the latest sleep study with CPAP. We will, therefore, arrange for a machine for home use through a DME (durable medical equipment) company of His choice; and I will see the patient back in follow-up in about 10 weeks. Please also explain to the patient that I will be looking out for compliance data, which can be downloaded from the machine (stored on an SD card, that is inserted in the machine) or via remote access through a modem, that is built into the machine. At the time of the followup appointment we will discuss sleep study results and how it is going with PAP treatment at home. Please advise patient to bring His machine at the time of the first FU visit, even though this is cumbersome. Bringing the machine for every visit after that will likely not be needed, but often helps for the first visit to troubleshoot if needed. Please re-enforce the importance of compliance with treatment and the need for [place] to monitor compliance data - often an insurance requirement and actually good feedback for the patient as far as how they are doing.  Also remind patient, that any interim PAP machine or mask issues should be first addressed with the DME company, as they can often help better with technical and mask fit issues. Please ask if patient has a preference regarding DME company.  Please also make sure, the patient has a follow-up appointment with me in about 10 weeks from the setup date, thanks.  Once you have spoken to the patient - and faxed/routed report to PCP and referring MD (if other than PCP), you can close this encounter, thanks,   Star Age, MD, PhD [facility] ([place])

## 2016-10-09 NOTE — Addendum Note (Signed)
Addended by: Star Age on: 10/09/2016 08:12 AM   Modules accepted: Orders

## 2016-10-09 NOTE — Telephone Encounter (Signed)
I spoke to patient and he is aware of results and recommendations. He is willing to start treatment. Per patient request, I will send orders to [facility]. Patient has f/u appt. I will send report to PCP.

## 2016-10-11 NOTE — Telephone Encounter (Signed)
I spoke to patient and he is aware of information below. I will send the orders to [facility]. Patient voiced understanding.

## 2016-10-11 NOTE — Telephone Encounter (Signed)
Sharon/[facility] [phone] called, pt lives in a competive bid area and they did not submit a bid for [place] and cannot help this pt. She said she is so sorry.

## 2016-10-29 ENCOUNTER — Encounter: Payer: Self-pay | Admitting: Diagnostic Neuroimaging

## 2016-10-29 ENCOUNTER — Ambulatory Visit (INDEPENDENT_AMBULATORY_CARE_PROVIDER_SITE_OTHER): Payer: Medicare Other | Admitting: Diagnostic Neuroimaging

## 2016-10-29 VITALS — BP 139/81 | HR 61 | Wt 156.0 lb

## 2016-10-29 DIAGNOSIS — G4733 Obstructive sleep apnea (adult) (pediatric): Secondary | ICD-10-CM

## 2016-10-29 DIAGNOSIS — R413 Other amnesia: Secondary | ICD-10-CM | POA: Diagnosis not present

## 2016-10-29 DIAGNOSIS — H9192 Unspecified hearing loss, left ear: Secondary | ICD-10-CM | POA: Diagnosis not present

## 2016-10-29 NOTE — Patient Instructions (Signed)
-   start CPAP as recommended  - agree with hearing aid evaluation  - consider clinical research study ([email or web address])  - continue brain healthy activities

## 2016-10-29 NOTE — Progress Notes (Signed)
[facility]  PATIENT: Carlos Pierce DOB: 1941-01-09  REFERRING CLINICIAN: [name] / Tollie Pizza HISTORY FROM: patient and wife  REASON FOR VISIT: follow up    HISTORICAL  CHIEF COMPLAINT:  Chief Complaint  Patient presents with  . Memory Loss    rm 7, wife-Susan, MMSE 29  . Follow-up    3 month    HISTORY OF PRESENT ILLNESS:   UPDATE 10/29/16: Since last visit, doing well. Memory loss stable. Now trying to stay calm when he forgets, and then the name or thought returns. Also with mild OSA, planning to start CPAP next month.   PRIOR HPI (07/23/16): 76 year old right-handed male here for evaluation of memory loss. Patient reports 1-2 months of mild short-term memory loss, losing track of his train of thought, word finding difficulties, difficulty with name recall, forgetting how to stay on task. 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 Otherwise he is able to maintain all of his activities of daily living. He takes care of paying the bills and finances at home. He does light chores around the house. Patient's wife reports at least one year of duration of the symptoms. She also notes that he is having more trouble with driving directions, recall of people's names, telling a story and then getting off track at the end. Patient is otherwise very active physically. He exercises 4-5 times per week.   REVIEW OF SYSTEMS: Full 14 system review of systems performed and negative with exception of: Snoring apnea memory loss hearing loss restless legs.   ALLERGIES: No Known Allergies  HOME MEDICATIONS: Outpatient Medications Prior to Visit  Medication Sig Dispense Refill  . Apoaequorin (PREVAGEN PO) Take 20 mg by mouth daily.    Marland Kitchen aspirin 81 MG tablet Take 81 mg by mouth daily.    . ASTRAGALUS PO Take 400 mg by mouth daily.    Raelyn Ensign Pollen 1000 MG TABS Take 1 tablet by mouth daily.    . Bran 500 MG TABS Take 1 tablet by mouth 2 (two) times daily.    Valente David Yeast TABS Take 1 tablet by mouth daily.    . Coenzyme Q10 (CO Q 10 PO) Take 300 mg by mouth daily.    Marland Kitchen ezetimibe (ZETIA) 10 MG tablet Take 1 tablet (10 mg total) by mouth daily. 90 tablet 3  . finasteride (PROSCAR) 5 MG tablet Take 1 tablet by mouth daily.    . fluticasone (FLONASE) 50 MCG/ACT nasal spray Place 1 spray into both nostrils daily.    . Ginkgo Biloba 40 MG TABS Take 240 mg by mouth daily.    Marland Kitchen L-Tyrosine 500 MG CAPS Take 1 capsule by mouth daily.    Marland Kitchen lisinopril (PRINIVIL,ZESTRIL) 5 MG tablet Take 1 tablet (5 mg total) by mouth daily. 90 tablet 3  . Lutein-Zeaxanthin 25-5 MG CAPS Take 1 capsule by mouth daily.    . Milk Thistle 1000 MG CAPS Take 1 capsule by mouth daily.    . Multiple Vitamins-Minerals (MULTIVITAMIN & MINERAL PO) Take 1 tablet by mouth daily.    . Omega-3 Fatty Acids (OMEGA 3 PO) Take 1,000 mg by mouth daily.    . Pitavastatin Calcium (LIVALO) 2 MG TABS Take 1 tablet (2 mg total) by mouth daily. 90 tablet 3  . pravastatin (PRAVACHOL) 40 MG tablet Take 1 tablet (40 mg total) by mouth every evening. 90 tablet 3   No facility-administered medications prior to visit.     PAST  MEDICAL HISTORY: Past Medical History:  Diagnosis Date  . Memory loss     PAST SURGICAL HISTORY: Past Surgical History:  Procedure Laterality Date  . CATARACT EXTRACTION Bilateral   . HEMORRHOID BANDING  2016    FAMILY HISTORY: History reviewed. No pertinent family history.  SOCIAL HISTORY:  Social History   Social History  . Marital status: Married    Spouse name: [name]  . Number of children: 1  . Years of education: 47   Occupational History  .      [employer]   Social History Main Topics  . Smoking status: Never Smoker  . Smokeless tobacco: Never Used  . Alcohol use 1.0 oz/week    2 Standard drinks or equivalent per week     Comment: wine a few days a week  . Drug use: No  . Sexual activity: Not on file   Other Topics Concern  . Not on  file   Social History Narrative   Drinks 1 cup of coffee a day      PHYSICAL EXAM  GENERAL EXAM/CONSTITUTIONAL: Vitals:  Vitals:   10/29/16 0750  BP: 139/81  Pulse: 61  Weight: 156 lb (70.8 kg)   Body mass index is 25.18 kg/m. No exam data present  Patient is in no distress; well developed, nourished and groomed; neck is supple  CARDIOVASCULAR:  Examination of carotid arteries is normal; no carotid bruits  Regular rate and rhythm, no murmurs  Examination of peripheral vascular system by observation and palpation is normal  EYES:  Ophthalmoscopic exam of optic discs and posterior segments is normal; no papilledema or hemorrhages  MUSCULOSKELETAL:  Gait, strength, tone, movements noted in Neurologic exam below  NEUROLOGIC: MENTAL STATUS:  MMSE - [place] Exam 10/29/2016 07/23/2016  Orientation to time 5 5  Orientation to Place 5 4  Registration 3 3  Attention/ Calculation 5 5  Recall 3 2  Language- name 2 objects 2 2  Language- repeat 1 1  Language- follow 3 step command 3 3  Language- read & follow direction 1 1  Write a sentence 1 1  Copy design 0 1  Total score 29 28    awake, alert, oriented to person, place and time  recent and remote memory intact  normal attention and concentration  language fluent, comprehension intact, naming intact,   fund of knowledge appropriate  CRANIAL NERVE:   2nd - no papilledema on fundoscopic exam  2nd, 3rd, 4th, 6th - pupils equal and reactive to light, visual fields full to confrontation, extraocular muscles intact, no nystagmus  5th - facial sensation symmetric  7th - facial strength symmetric  8th - hearing intact  9th - palate elevates symmetrically, uvula midline  11th - shoulder shrug symmetric  12th - tongue protrusion midline  MOTOR:   normal bulk and tone, full strength in the BUE, BLE  SENSORY:   normal and symmetric to light touch, temperature, vibration  COORDINATION:    finger-nose-finger, fine finger movements normal  REFLEXES:   deep tendon reflexes present and symmetric  GAIT/STATION:   narrow based gait; able to walk tandem; romberg is negative    DIAGNOSTIC DATA (LABS, IMAGING, TESTING) - I reviewed patient records, labs, notes, testing and imaging myself where available.  Lab Results  Component Value Date   WBC 5.9 06/29/2016   HGB 13.6 06/29/2016   HCT 40.5 06/29/2016   MCV 90.2 06/29/2016   PLT 167 06/29/2016  Component Value Date/Time   NA 141 06/29/2016 1021   K 4.4 06/29/2016 1021   CL 107 06/29/2016 1021   CO2 26 06/29/2016 1021   GLUCOSE 103 (H) 06/29/2016 1021   BUN 27 (H) 06/29/2016 1021   CREATININE 1.17 06/29/2016 1021   CALCIUM 9.7 06/29/2016 1021   PROT 6.9 06/29/2016 1021   ALBUMIN 4.4 06/29/2016 1021   AST 25 06/29/2016 1021   ALT 35 06/29/2016 1021   ALKPHOS 48 06/29/2016 1021   BILITOT 0.7 06/29/2016 1021   Lab Results  Component Value Date   CHOL 146 06/29/2016   HDL 51 06/29/2016   LDLCALC 69 06/29/2016   TRIG 130 06/29/2016   CHOLHDL 2.9 06/29/2016   No results found for: HGBA1C Lab Results  Component Value Date   VITAMINB12 815 07/23/2016   Lab Results  Component Value Date   TSH 2.790 07/23/2016    10/13/14 MRI brain [I reviewed images myself and agree with interpretation. -[facility]P]  1. No acute or focal lesion to explain left-sided hearing loss. 2. Right mastoid effusion. No obstructing nasopharyngeal lesion is evident.  08/07/16 MRI brain [I reviewed images myself and agree with interpretation. -[facility]P]  - showing mild changes of chronic microvascular ischemia, generalized cerebral atrophy and mild changes of chronic paranasal sinus inflammation. Overall no significant change compared with previous MRI scan dated 10/13/2014.  09/02/16 PSG 1. This study demonstrates overall mild obstructive sleep apnea, with a total AHI of 7.2/hour, REM AHI of 12.7/hour, and O2 nadir of 78%. Given the  patient's medical history and sleep related complaints, as well as desaturations into the 80s and as low as 78%, treatment with positive airway pressure in the form of CPAP is recommended; this will require a full night titration study to optimize therapy.  2. Moderate PLMs (periodic limb movements of sleep) were noted during the study with minimalarousals; clinical correlation is recommended. 3. This study shows sleep fragmentation and abnormal sleep stage percentages; these are nonspecificfindings and per se do not signify an intrinsic sleep disorder or a cause for the patient's sleep-related symptoms. Causes include (but are not limited to) the first night effect of the sleep study,circadian rhythm disturbances, medication effect or an underlying mood disorder or medical problem.   10/07/16 CPAP titration  - This study demonstrates near complete resolution of the patient's obstructive sleep apnea with CPAP therapy.    ASSESSMENT AND PLAN  76 y.o. year old male here with mild memory loss since 2017, mainly short term, recall, names, directions.    Ddx: mild cognitive impairement vs sleep apnea cognitive impairement  1. Memory loss   2. OSA (obstructive sleep apnea)   3. Hearing loss of left ear, unspecified hearing loss type      PLAN: I spent 25 minutes of face to face time with patient. Greater than 50% of time was spent in counseling and coordination of care with patient. In summary we discussed:  - start CPAP as recommended - agree with hearing aid evaluation - consider clinical research study - brain healthy activities reviewed  Return in about 6 months (around 05/01/2017).    Penni Bombard, MD 6/38/7564, 3:32 AM Certified in Neurology, Neurophysiology and Neuroimaging  [facility] [address], [place], [place] [address] [phone]

## 2016-11-20 DIAGNOSIS — H903 Sensorineural hearing loss, bilateral: Secondary | ICD-10-CM | POA: Diagnosis not present

## 2016-12-27 ENCOUNTER — Ambulatory Visit: Payer: Self-pay | Admitting: Neurology

## 2017-01-07 NOTE — Progress Notes (Addendum)
[facility]  PATIENT: BOWDEN BOODY DOB: 08/09/1940   REASON FOR VISIT: Follow-up for initial CPAP compliance for obstructive sleep apnea HISTORY FROM:patient and wife [name]    HISTORY OF PRESENT ILLNESS:UPDATE 07/31/2018CM Mr.Hanke, 76 year old male returns for follow-up for obstructive sleep apnea and initial  compliance for CPAP usage. Data dated 12/09/2016 through 01/07/2017 usage days 28 out of 30 or 93%. Usage hours greater than 4 was 80%. Average usage 6 hours 7 minutes. Set pressure 7 cm EPR 3. AHI 5.7. Patient says he is still trying to get used to the machine. He has much less fatigue. ESS score 0. FSS 9. He returns for reevaluation   08/16/16 DR AtharMr. Headley is a 76 year old right-handed gentleman with an underlying medical history of memory loss, Hyperlipidemia, borderline overweight state and status post cataract repairs, who reports snoring, and excessive daytime somnolence. He snores loudly per wife. He sometimes makes gasping sounds at night. I reviewed your office note from 07/23/2016. He had a brain MRI without contrast on 08/07/2016 which I reviewed:  IMPRESSION: Abnormal MRI scan of the brain showing mild changes of chronic microvascular ischemia, generalized cerebral atrophy and mild changes of chronic paranasal sinus inflammation. Overall no significant change compared with previous MRI scan dated 10/13/2014.  His wife reports that they did not get results. I see that there was a phone call placed this morning to them for results, but they were probably already on their way to this appointment and missed that call.I told him about test results during her clinic appointment and reassured him that findings were stable from a previous MRI from May 2016 which is reassuring. He reports a longer standing history of difficulty maintaining sleep, he falls asleep almost instantly at bedtime is typically around 10 PM. He wakes up about 4 hours  later and has trouble going back to sleep, feels like he's only dozing off lightly. He tosses and turns, often his wife does not sleep in the same bed with him. He endorses restless leg symptoms and wife noticed that he is quite restless when he is watching TV sitting down, he tends to shift around and move his legs. He denies morning headaches or nocturia, his son has sleep apnea and uses a CPAP machine. He has had nocturnal leg cramps. His wake up time typically is between 6:30 and 7, he gets up without an alarm. 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 He had a tonsillectomy as a child. He drinks little caffeine, one cup of coffee per day, typically no daily sodas or tea. He tries to drink enough water, he is a non-smoker, he drinks alcohol very occasionally, once or twice per month on average. He has had difficulty with his sleep for at least 5 years. His Epworth sleepiness score is 8 out of 24, fatigue score is 35 out of 63.  REVIEW OF SYSTEMS: Full 14 system review of systems performed and notable only for those listed, all others are neg:  Constitutional: neg  Cardiovascular: neg Ear/Nose/Throat: neg  Skin: neg Eyes: neg Respiratory: neg Gastroitestinal: neg  Hematology/Lymphatic: neg  Endocrine: neg Musculoskeletal:neg Allergy/Immunology: neg Neurological: neg Psychiatric: neg Sleep : Obstructive sleep apnea with CPAP   ALLERGIES: No Known Allergies  HOME MEDICATIONS: Outpatient Medications Prior to Visit  Medication Sig Dispense Refill  . Apoaequorin (PREVAGEN PO) Take 20 mg by mouth daily.    Marland Kitchen aspirin 81 MG tablet Take 81 mg by mouth daily.    Marland Kitchen  ASTRAGALUS PO Take 400 mg by mouth daily.    Raelyn Ensign Pollen 1000 MG TABS Take 1 tablet by mouth daily.    . Bran 500 MG TABS Take 1 tablet by mouth 2 (two) times daily.    Valente David Yeast TABS Take 1 tablet by mouth daily.    . Coenzyme Q10 (CO Q 10 PO) Take 300 mg by mouth daily.    Marland Kitchen ezetimibe  (ZETIA) 10 MG tablet Take 1 tablet (10 mg total) by mouth daily. 90 tablet 3  . finasteride (PROSCAR) 5 MG tablet Take 1 tablet by mouth daily.    . fluticasone (FLONASE) 50 MCG/ACT nasal spray Place 1 spray into both nostrils daily.    . Ginkgo Biloba 40 MG TABS Take 240 mg by mouth daily.    Marland Kitchen L-Tyrosine 500 MG CAPS Take 1 capsule by mouth daily.    Marland Kitchen lisinopril (PRINIVIL,ZESTRIL) 5 MG tablet Take 1 tablet (5 mg total) by mouth daily. 90 tablet 3  . Lutein-Zeaxanthin 25-5 MG CAPS Take 1 capsule by mouth daily.    . Milk Thistle 1000 MG CAPS Take 1 capsule by mouth daily.    . Multiple Vitamins-Minerals (MULTIVITAMIN & MINERAL PO) Take 1 tablet by mouth daily.    . Omega-3 Fatty Acids (OMEGA 3 PO) Take 1,000 mg by mouth daily.    . Pitavastatin Calcium (LIVALO) 2 MG TABS Take 1 tablet (2 mg total) by mouth daily. 90 tablet 3  . pravastatin (PRAVACHOL) 40 MG tablet Take 1 tablet (40 mg total) by mouth every evening. 90 tablet 3   No facility-administered medications prior to visit.     PAST MEDICAL HISTORY: Past Medical History:  Diagnosis Date  . Memory loss     PAST SURGICAL HISTORY: Past Surgical History:  Procedure Laterality Date  . CATARACT EXTRACTION Bilateral   . HEMORRHOID BANDING  2016    FAMILY HISTORY: History reviewed. No pertinent family history.  SOCIAL HISTORY: Social History   Social History  . Marital status: Married    Spouse name: [name]  . Number of children: 1  . Years of education: 8   Occupational History  .      [employer]   Social History Main Topics  . Smoking status: Never Smoker  . Smokeless tobacco: Never Used  . Alcohol use 1.0 oz/week    2 Standard drinks or equivalent per week     Comment: wine a few days a week  . Drug use: No  . Sexual activity: Not on file   Other Topics Concern  . Not on file   Social History Narrative   Drinks 1 cup of coffee a day      PHYSICAL EXAM  Vitals:   01/08/17 0930  BP:  134/78  Pulse: 64  Weight: 158 lb (71.7 kg)   Body mass index is 25.5 kg/m.  Generalized: Well developed, in no acute distress  Head: normocephalic and atraumatic,. Oropharynx benign  Neck: Supple,  Musculoskeletal: No deformity   Neurological examination   Mentation: Alert oriented to time, place, history taking. Attention span and concentration appropriate. Recent and remote memory intact.  Follows all commands speech and language fluent.   Cranial nerve II-XII: Pupils were equal round reactive to light extraocular movements were full, visual field were full on confrontational test. Facial sensation and strength were normal. hearing was intact to finger rubbing bilaterally. Uvula tongue midline. head turning and shoulder shrug were normal and symmetric.Tongue protrusion into  cheek strength was normal. Motor: normal bulk and tone, full strength in the BUE, BLE, fine finger movements normal, no pronator drift. No focal weakness Sensory: normal and symmetric to light touch, in  the upper and lower extremities  Coordination: finger-nose-finger, heel-to-shin bilaterally, no dysmetria Reflexes: Symmetric upper and lower plantar responses were flexor bilaterally. Gait and Station: Rising up from seated position without assistance, normal stance,  moderate stride, good arm swing, smooth turning, able to perform tiptoe, and heel walking without difficulty. Tandem gait is steady  DIAGNOSTIC DATA (LABS, IMAGING, TESTING) - I reviewed patient records, labs, notes, testing and imaging myself where available.  Lab Results  Component Value Date   WBC 5.9 06/29/2016   HGB 13.6 06/29/2016   HCT 40.5 06/29/2016   MCV 90.2 06/29/2016   PLT 167 06/29/2016      Component Value Date/Time   NA 141 06/29/2016 1021   K 4.4 06/29/2016 1021   CL 107 06/29/2016 1021   CO2 26 06/29/2016 1021   GLUCOSE 103 (H) 06/29/2016 1021   BUN 27 (H) 06/29/2016 1021   CREATININE 1.17 06/29/2016 1021   CALCIUM 9.7  06/29/2016 1021   PROT 6.9 06/29/2016 1021   ALBUMIN 4.4 06/29/2016 1021   AST 25 06/29/2016 1021   ALT 35 06/29/2016 1021   ALKPHOS 48 06/29/2016 1021   BILITOT 0.7 06/29/2016 1021   Lab Results  Component Value Date   CHOL 146 06/29/2016   HDL 51 06/29/2016   LDLCALC 69 06/29/2016   TRIG 130 06/29/2016   CHOLHDL 2.9 06/29/2016   No results found for: HGBA1C Lab Results  Component Value Date   VITAMINB12 815 07/23/2016   Lab Results  Component Value Date   TSH 2.790 07/23/2016      ASSESSMENT AND PLAN 76 y.o.-year old male with an underlying medical history of memory loss, Hyperlipidemia, borderline overweight state and status post cataract repairs, whose sleep study confirmed obstructive sleep apnea. He is now on CPAP . He is here for his first CPAP compliance report . Data dated 12/09/2016 through 01/07/2017 usage days 28 out of 30 or 93%. Usage hours greater than 4 was 80%. Average usage 6 hours 7 minutes. Set pressure 7 cm EPR 3. AHI 5.7 ESS 0. FSS 9.  PLAN: CPAP compliance 93% and 80% greater than 4 hours reviewed with patient Follow-up in 6 months for repeat compliance Dennie Bible, [facility], [facility], APRN  [facility] [address] [place], [place] [address] [phone]  I reviewed the above note and documentation by the Nurse Practitioner and agree with the history, physical exam, assessment and plan as outlined above. I was immediately available for face-to-face consultation. Star Age, MD, PhD [facility])

## 2017-01-08 ENCOUNTER — Encounter: Payer: Self-pay | Admitting: Nurse Practitioner

## 2017-01-08 ENCOUNTER — Ambulatory Visit (INDEPENDENT_AMBULATORY_CARE_PROVIDER_SITE_OTHER): Payer: Medicare Other | Admitting: Nurse Practitioner

## 2017-01-08 ENCOUNTER — Telehealth: Payer: Self-pay | Admitting: *Deleted

## 2017-01-08 DIAGNOSIS — Z9989 Dependence on other enabling machines and devices: Secondary | ICD-10-CM

## 2017-01-08 DIAGNOSIS — G4733 Obstructive sleep apnea (adult) (pediatric): Secondary | ICD-10-CM | POA: Diagnosis not present

## 2017-01-08 NOTE — Telephone Encounter (Signed)
Spoke with patient to reschedule his 6 month FU in Nov due to dr being out of office. He stated any day of week in the morning is fine. He requested to be called with reminder, stated to make it any day. This RN rescheduled for a Friday morning in Dec.

## 2017-01-08 NOTE — Patient Instructions (Signed)
CPAP compliance 93% and 80% greater than 4 hours Follow-up in 6 months for repeat compliance

## 2017-04-08 ENCOUNTER — Telehealth: Payer: Self-pay | Admitting: Cardiovascular Disease

## 2017-04-08 NOTE — Telephone Encounter (Signed)
Pt needs hepatic and FLP orders are in  Lab order printed for [facility]-pt would like to have PSA done also. I informed pt to call PCP and he states that Dr Ellyn Hack always has ordered this in the past.

## 2017-04-08 NOTE — Telephone Encounter (Signed)
ok 

## 2017-04-08 NOTE — Telephone Encounter (Signed)
New message    Patient request lab order to be sent to him, to get labs prior to Feb appt. Patients request to add PSA on labs as well.

## 2017-05-01 ENCOUNTER — Ambulatory Visit: Payer: Medicare Other | Admitting: Diagnostic Neuroimaging

## 2017-05-21 ENCOUNTER — Ambulatory Visit: Payer: Self-pay | Admitting: Diagnostic Neuroimaging

## 2017-05-23 ENCOUNTER — Encounter: Payer: Self-pay | Admitting: Diagnostic Neuroimaging

## 2017-05-23 ENCOUNTER — Ambulatory Visit (INDEPENDENT_AMBULATORY_CARE_PROVIDER_SITE_OTHER): Payer: Medicare Other | Admitting: Diagnostic Neuroimaging

## 2017-05-23 VITALS — BP 156/94 | HR 62 | Wt 158.4 lb

## 2017-05-23 DIAGNOSIS — R413 Other amnesia: Secondary | ICD-10-CM | POA: Diagnosis not present

## 2017-05-23 DIAGNOSIS — G4733 Obstructive sleep apnea (adult) (pediatric): Secondary | ICD-10-CM | POA: Diagnosis not present

## 2017-05-23 NOTE — Patient Instructions (Signed)
MEMORY LOSS - consider donepezil or memantine - consider clinical research study ([email or web address]clinic) - brain healthy activities reviewed (nutrition, exercise, cognitive stimulation, sleep)  HEARING LOSS - continue hearing aid use  Sleep apnea - continue CPAP

## 2017-05-23 NOTE — Progress Notes (Addendum)
[facility]   PATIENT: Carlos Pierce DOB: 1941-02-12  REFERRING CLINICIAN: [name] / Tollie Pizza HISTORY FROM: patient and wife  REASON FOR VISIT: follow up    HISTORICAL  CHIEF COMPLAINT:  Chief Complaint  Patient presents with  . Memory Loss    rm 7, wifeManuela Pierce  MMSE 23  . Follow-up    6 month    HISTORY OF PRESENT ILLNESS:   UPDATE (05/23/17, VRP): Since last visit, doing well per patient. Wife thinks there has been mild decline over last year, esp slower with conversations and losing train of thought and driving directions. No alleviating or aggravating factors. Eating well. No major change in ADLs. He stopped BP meds and chol meds to see if memory loss would improve, but he is not sure if there has been any change.   UPDATE 10/29/16: Since last visit, doing well. Memory loss stable. Now trying to stay calm when he forgets, and then the name or thought returns. Also with mild OSA, planning to start CPAP next month.   PRIOR HPI (07/23/16): 76 year old right-handed male here for evaluation of memory loss. Patient reports 1-2 months of mild short-term memory loss, losing track of his train of thought, word finding difficulties, difficulty with name recall, forgetting how to stay on task. 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 Otherwise he is able to maintain all of his activities of daily living. He takes care of paying the bills and finances at home. He does light chores around the house. Patient's wife reports at least one year of duration of the symptoms. She also notes that he is having more trouble with driving directions, recall of people's names, telling a story and then getting off track at the end. Patient is otherwise very active physically. He exercises 4-5 times per week.   REVIEW OF SYSTEMS: Full 14 system review of systems performed and negative with exception of: Snoring apnea memory loss hearing loss restless legs.   ALLERGIES: No  Known Allergies  HOME MEDICATIONS: Outpatient Medications Prior to Visit  Medication Sig Dispense Refill  . Apoaequorin (PREVAGEN PO) Take 20 mg by mouth daily.    Marland Kitchen aspirin 81 MG tablet Take 81 mg by mouth daily.    . ASTRAGALUS PO Take 400 mg by mouth daily.    Raelyn Ensign Pollen 1000 MG TABS Take 1 tablet by mouth daily.    . Bran 500 MG TABS Take 1 tablet by mouth 2 (two) times daily.    Valente David Yeast TABS Take 1 tablet by mouth daily.    . Coenzyme Q10 (CO Q 10 PO) Take 300 mg by mouth daily.    Marland Kitchen ezetimibe (ZETIA) 10 MG tablet Take 1 tablet (10 mg total) by mouth daily. 90 tablet 3  . finasteride (PROSCAR) 5 MG tablet Take 1 tablet by mouth daily.    . fluticasone (FLONASE) 50 MCG/ACT nasal spray Place 1 spray into both nostrils daily.    . Ginkgo Biloba 40 MG TABS Take 240 mg by mouth daily.    Marland Kitchen L-Tyrosine 500 MG CAPS Take 1 capsule by mouth daily.    Marland Kitchen lisinopril (PRINIVIL,ZESTRIL) 5 MG tablet Take 1 tablet (5 mg total) by mouth daily. 90 tablet 3  . Lutein-Zeaxanthin 25-5 MG CAPS Take 1 capsule by mouth daily.    . Milk Thistle 1000 MG CAPS Take 1 capsule by mouth daily.    . Multiple Vitamins-Minerals (MULTIVITAMIN & MINERAL PO) Take 1 tablet  by mouth daily.    . Omega-3 Fatty Acids (OMEGA 3 PO) Take 1,000 mg by mouth daily.    . Pitavastatin Calcium (LIVALO) 2 MG TABS Take 1 tablet (2 mg total) by mouth daily. 90 tablet 3  . pravastatin (PRAVACHOL) 40 MG tablet Take 1 tablet (40 mg total) by mouth every evening. 90 tablet 3   No facility-administered medications prior to visit.     PAST MEDICAL HISTORY: Past Medical History:  Diagnosis Date  . Memory loss     PAST SURGICAL HISTORY: Past Surgical History:  Procedure Laterality Date  . CATARACT EXTRACTION Bilateral   . HEMORRHOID BANDING  2016    FAMILY HISTORY: No family history on file.  SOCIAL HISTORY:  Social History   Socioeconomic History  . Marital status: Married    Spouse name: Carlos Pierce  . Number of  children: 1  . Years of education: 48  . Highest education level: Not on file  Social Needs  . Financial resource strain: Not on file  . Food insecurity - worry: Not on file  . Food insecurity - inability: Not on file  . Transportation needs - medical: Not on file  . Transportation needs - non-medical: Not on file  Occupational History    Comment: [employer]  Tobacco Use  . Smoking status: Never Smoker  . Smokeless tobacco: Never Used  Substance and Sexual Activity  . Alcohol use: Yes    Alcohol/week: 1.0 oz    Types: 2 Standard drinks or equivalent per week    Comment: wine a few days a week  . Drug use: No  . Sexual activity: Not on file  Other Topics Concern  . Not on file  Social History Narrative   Drinks 1 cup of coffee a day      PHYSICAL EXAM  GENERAL EXAM/CONSTITUTIONAL: Vitals:  Vitals:   05/23/17 1042  BP: (!) 156/94  Pulse: 62  Weight: 158 lb 6.4 oz (71.8 kg)   Body mass index is 25.57 kg/m. No exam data present  Patient is in no distress; well developed, nourished and groomed; neck is supple  CARDIOVASCULAR:  Examination of carotid arteries is normal; no carotid bruits  Regular rate and rhythm, no murmurs  Examination of peripheral vascular system by observation and palpation is normal  EYES:  Ophthalmoscopic exam of optic discs and posterior segments is normal; no papilledema or hemorrhages  MUSCULOSKELETAL:  Gait, strength, tone, movements noted in Neurologic exam below  NEUROLOGIC: MENTAL STATUS:  MMSE - [place] Exam 05/23/2017 10/29/2016 07/23/2016  Orientation to time 3 5 5   Orientation to Place 4 5 4   Registration 3 3 3   Attention/ Calculation 4 5 5   Recall 1 3 2   Language- name 2 objects 2 2 2   Language- repeat 1 1 1   Language- follow 3 step command 3 3 3   Language- read & follow direction 1 1 1   Write a sentence 1 1 1   Copy design 0 0 1  Total score 23 29 28     awake, alert, oriented to person,  place and time  recent and remote memory intact  normal attention and concentration  language fluent, comprehension intact, naming intact,   fund of knowledge appropriate  CRANIAL NERVE:   2nd - no papilledema on fundoscopic exam  2nd, 3rd, 4th, 6th - pupils equal and reactive to light, visual fields full to confrontation, extraocular muscles intact, no nystagmus  5th - facial sensation symmetric  7th -  facial strength symmetric  8th - hearing intact  9th - palate elevates symmetrically, uvula midline  11th - shoulder shrug symmetric  12th - tongue protrusion midline  MOTOR:   normal bulk and tone, full strength in the BUE, BLE  SENSORY:   normal and symmetric to light touch, temperature, vibration  COORDINATION:   finger-nose-finger, fine finger movements normal  REFLEXES:   deep tendon reflexes present and symmetric  GAIT/STATION:   narrow based gait    DIAGNOSTIC DATA (LABS, IMAGING, TESTING) - I reviewed patient records, labs, notes, testing and imaging myself where available.  Lab Results  Component Value Date   WBC 5.9 06/29/2016   HGB 13.6 06/29/2016   HCT 40.5 06/29/2016   MCV 90.2 06/29/2016   PLT 167 06/29/2016      Component Value Date/Time   NA 141 06/29/2016 1021   K 4.4 06/29/2016 1021   CL 107 06/29/2016 1021   CO2 26 06/29/2016 1021   GLUCOSE 103 (H) 06/29/2016 1021   BUN 27 (H) 06/29/2016 1021   CREATININE 1.17 06/29/2016 1021   CALCIUM 9.7 06/29/2016 1021   PROT 6.9 06/29/2016 1021   ALBUMIN 4.4 06/29/2016 1021   AST 25 06/29/2016 1021   ALT 35 06/29/2016 1021   ALKPHOS 48 06/29/2016 1021   BILITOT 0.7 06/29/2016 1021   Lab Results  Component Value Date   CHOL 146 06/29/2016   HDL 51 06/29/2016   LDLCALC 69 06/29/2016   TRIG 130 06/29/2016   CHOLHDL 2.9 06/29/2016   No results found for: HGBA1C Lab Results  Component Value Date   [id number] 098 07/23/2016   Lab Results  Component Value Date   TSH 2.790  07/23/2016    10/13/14 MRI brain [I reviewed images myself and agree with interpretation. -VRP]  1. No acute or focal lesion to explain left-sided hearing loss. 2. Right mastoid effusion. No obstructing nasopharyngeal lesion is evident.  08/07/16 MRI brain [I reviewed images myself and agree with interpretation. -VRP]  - showing mild changes of chronic microvascular ischemia, generalized cerebral atrophy and mild changes of chronic paranasal sinus inflammation. Overall no significant change compared with previous MRI scan dated 10/13/2014.  09/02/16 PSG 1. This study demonstrates overall mild obstructive sleep apnea, with a total AHI of 7.2/hour, REM AHI of 12.7/hour, and O2 nadir of 78%. Given the patient's medical history and sleep related complaints, as well as desaturations into the 80s and as low as 78%, treatment with positive airway pressure in the form of CPAP is recommended; this will require a full night titration study to optimize therapy.  2. Moderate PLMs (periodic limb movements of sleep) were noted during the study with minimalarousals; clinical correlation is recommended. 3. This study shows sleep fragmentation and abnormal sleep stage percentages; these are nonspecificfindings and per se do not signify an intrinsic sleep disorder or a cause for the patient's sleep-related symptoms. Causes include (but are not limited to) the first night effect of the sleep study,circadian rhythm disturbances, medication effect or an underlying mood disorder or medical problem.   10/07/16 CPAP titration  - This study demonstrates near complete resolution of the patient's obstructive sleep apnea with CPAP therapy.    ASSESSMENT AND PLAN  76 y.o. year old male here with mild memory loss since 2017, mainly short term, recall, names, directions.    Ddx: mild cognitive impairement vs prodromal dementia  1. Memory loss   2. OSA (obstructive sleep apnea)      PLAN:  I spent  40 minutes of face to  face time with patient. Greater than 50% of time was spent in counseling and coordination of care with patient. In summary we discussed:   MEMORY LOSS - check computer based cognitive testing today --> [place] - consider donepezil or memantine - consider clinical research study - brain healthy activities reviewed (nutrition, exercise, cognitive stimulation, sleep)  HEARING LOSS - continue hearing aid usage  OSA - continue CPAP  Orders Placed This Encounter  Procedures  . Neuropsychological testing   Return in about 6 months (around 11/21/2017).    Penni Bombard, MD 30/13/1438, 88:75 AM Certified in Neurology, Neurophysiology and Neuroimaging  [facility] [address] [place], [place] [address] [phone]

## 2017-05-24 DIAGNOSIS — M79671 Pain in right foot: Secondary | ICD-10-CM | POA: Diagnosis not present

## 2017-05-31 NOTE — Addendum Note (Signed)
Addended by: Andrey Spearman R on: 05/31/2017 03:00 PM   Modules accepted: Orders

## 2017-07-04 DIAGNOSIS — H35411 Lattice degeneration of retina, right eye: Secondary | ICD-10-CM | POA: Diagnosis not present

## 2017-07-04 DIAGNOSIS — H26491 Other secondary cataract, right eye: Secondary | ICD-10-CM | POA: Diagnosis not present

## 2017-07-04 DIAGNOSIS — D3132 Benign neoplasm of left choroid: Secondary | ICD-10-CM | POA: Diagnosis not present

## 2017-07-04 DIAGNOSIS — Z961 Presence of intraocular lens: Secondary | ICD-10-CM | POA: Diagnosis not present

## 2017-07-08 DIAGNOSIS — R972 Elevated prostate specific antigen [PSA]: Secondary | ICD-10-CM | POA: Diagnosis not present

## 2017-07-08 DIAGNOSIS — Z79899 Other long term (current) drug therapy: Secondary | ICD-10-CM | POA: Diagnosis not present

## 2017-07-08 DIAGNOSIS — E785 Hyperlipidemia, unspecified: Secondary | ICD-10-CM | POA: Diagnosis not present

## 2017-07-08 LAB — LIPID PANEL
Cholesterol: 173 mg/dL (ref <200)
HDL: 56 mg/dL (ref >40)
LDL Cholesterol (Calc): 94 mg/dL (calc)
NON-HDL CHOLESTEROL (CALC): 117 mg/dL (ref <130)
Total CHOL/HDL Ratio: 3.1 (calc) (ref <5.0)
Triglycerides: 130 mg/dL (ref <150)

## 2017-07-08 LAB — HEPATIC FUNCTION PANEL
AG RATIO: 1.8 (calc) (ref 1.0–2.5)
ALT: 37 U/L (ref 9–46)
AST: 23 U/L (ref 10–35)
Albumin: 4.5 g/dL (ref 3.6–5.1)
Alkaline phosphatase (APISO): 47 U/L (ref 40–115)
Bilirubin, Direct: 0.1 mg/dL (ref 0.0–0.2)
GLOBULIN: 2.5 g/dL (ref 1.9–3.7)
Indirect Bilirubin: 0.5 mg/dL (calc) (ref 0.2–1.2)
Total Bilirubin: 0.6 mg/dL (ref 0.2–1.2)
Total Protein: 7 g/dL (ref 6.1–8.1)

## 2017-07-15 ENCOUNTER — Ambulatory Visit: Payer: Medicare Other | Admitting: Cardiovascular Disease

## 2017-07-30 DIAGNOSIS — M5431 Sciatica, right side: Secondary | ICD-10-CM | POA: Diagnosis not present

## 2017-07-30 DIAGNOSIS — M5137 Other intervertebral disc degeneration, lumbosacral region: Secondary | ICD-10-CM | POA: Diagnosis not present

## 2017-07-30 DIAGNOSIS — M5136 Other intervertebral disc degeneration, lumbar region: Secondary | ICD-10-CM | POA: Diagnosis not present

## 2017-07-30 DIAGNOSIS — M9903 Segmental and somatic dysfunction of lumbar region: Secondary | ICD-10-CM | POA: Diagnosis not present

## 2017-08-01 ENCOUNTER — Telehealth: Payer: Self-pay | Admitting: Cardiovascular Disease

## 2017-08-01 DIAGNOSIS — M5137 Other intervertebral disc degeneration, lumbosacral region: Secondary | ICD-10-CM | POA: Diagnosis not present

## 2017-08-01 DIAGNOSIS — M5431 Sciatica, right side: Secondary | ICD-10-CM | POA: Diagnosis not present

## 2017-08-01 DIAGNOSIS — M9903 Segmental and somatic dysfunction of lumbar region: Secondary | ICD-10-CM | POA: Diagnosis not present

## 2017-08-01 DIAGNOSIS — M5136 Other intervertebral disc degeneration, lumbar region: Secondary | ICD-10-CM | POA: Diagnosis not present

## 2017-08-01 NOTE — Telephone Encounter (Signed)
Left message to call back  

## 2017-08-01 NOTE — Telephone Encounter (Signed)
Patient wife calling,  States that patient would like to discuss some possible medication adjustments

## 2017-08-02 DIAGNOSIS — N400 Enlarged prostate without lower urinary tract symptoms: Secondary | ICD-10-CM | POA: Diagnosis not present

## 2017-08-02 DIAGNOSIS — R972 Elevated prostate specific antigen [PSA]: Secondary | ICD-10-CM | POA: Diagnosis not present

## 2017-08-05 DIAGNOSIS — M5137 Other intervertebral disc degeneration, lumbosacral region: Secondary | ICD-10-CM | POA: Diagnosis not present

## 2017-08-05 DIAGNOSIS — M9903 Segmental and somatic dysfunction of lumbar region: Secondary | ICD-10-CM | POA: Diagnosis not present

## 2017-08-05 DIAGNOSIS — M5136 Other intervertebral disc degeneration, lumbar region: Secondary | ICD-10-CM | POA: Diagnosis not present

## 2017-08-05 DIAGNOSIS — M5431 Sciatica, right side: Secondary | ICD-10-CM | POA: Diagnosis not present

## 2017-08-05 NOTE — Telephone Encounter (Signed)
Spoke with pt wife, they have questions regarding the pravastatin. They have a follow up appointment with dr Claiborne Billings and will discuss at that time. Aware if patient runs out of pravastatin prior to appt it will be fine.

## 2017-08-08 DIAGNOSIS — M5431 Sciatica, right side: Secondary | ICD-10-CM | POA: Diagnosis not present

## 2017-08-08 DIAGNOSIS — M9903 Segmental and somatic dysfunction of lumbar region: Secondary | ICD-10-CM | POA: Diagnosis not present

## 2017-08-08 DIAGNOSIS — M5137 Other intervertebral disc degeneration, lumbosacral region: Secondary | ICD-10-CM | POA: Diagnosis not present

## 2017-08-08 DIAGNOSIS — M5136 Other intervertebral disc degeneration, lumbar region: Secondary | ICD-10-CM | POA: Diagnosis not present

## 2017-08-14 DIAGNOSIS — M5431 Sciatica, right side: Secondary | ICD-10-CM | POA: Diagnosis not present

## 2017-08-14 DIAGNOSIS — M5136 Other intervertebral disc degeneration, lumbar region: Secondary | ICD-10-CM | POA: Diagnosis not present

## 2017-08-14 DIAGNOSIS — R972 Elevated prostate specific antigen [PSA]: Secondary | ICD-10-CM | POA: Diagnosis not present

## 2017-08-14 DIAGNOSIS — M5137 Other intervertebral disc degeneration, lumbosacral region: Secondary | ICD-10-CM | POA: Diagnosis not present

## 2017-08-14 DIAGNOSIS — M9903 Segmental and somatic dysfunction of lumbar region: Secondary | ICD-10-CM | POA: Diagnosis not present

## 2017-08-15 ENCOUNTER — Other Ambulatory Visit: Payer: Self-pay | Admitting: Urology

## 2017-08-15 DIAGNOSIS — R972 Elevated prostate specific antigen [PSA]: Secondary | ICD-10-CM

## 2017-08-16 DIAGNOSIS — R972 Elevated prostate specific antigen [PSA]: Secondary | ICD-10-CM | POA: Diagnosis not present

## 2017-08-21 DIAGNOSIS — M5431 Sciatica, right side: Secondary | ICD-10-CM | POA: Diagnosis not present

## 2017-08-21 DIAGNOSIS — M5136 Other intervertebral disc degeneration, lumbar region: Secondary | ICD-10-CM | POA: Diagnosis not present

## 2017-08-21 DIAGNOSIS — M9903 Segmental and somatic dysfunction of lumbar region: Secondary | ICD-10-CM | POA: Diagnosis not present

## 2017-08-21 DIAGNOSIS — M5137 Other intervertebral disc degeneration, lumbosacral region: Secondary | ICD-10-CM | POA: Diagnosis not present

## 2017-08-28 DIAGNOSIS — M5136 Other intervertebral disc degeneration, lumbar region: Secondary | ICD-10-CM | POA: Diagnosis not present

## 2017-08-28 DIAGNOSIS — M9903 Segmental and somatic dysfunction of lumbar region: Secondary | ICD-10-CM | POA: Diagnosis not present

## 2017-08-28 DIAGNOSIS — M5137 Other intervertebral disc degeneration, lumbosacral region: Secondary | ICD-10-CM | POA: Diagnosis not present

## 2017-08-28 DIAGNOSIS — M5431 Sciatica, right side: Secondary | ICD-10-CM | POA: Diagnosis not present

## 2017-08-30 ENCOUNTER — Ambulatory Visit
Admission: RE | Admit: 2017-08-30 | Discharge: 2017-08-30 | Disposition: A | Discharge status: Home / Self Care | Payer: Medicare Other | Source: Ambulatory Visit | Attending: Urology | Admitting: Urology

## 2017-08-30 DIAGNOSIS — R972 Elevated prostate specific antigen [PSA]: Secondary | ICD-10-CM

## 2017-08-30 MED ORDER — GADOBENATE DIMEGLUMINE 529 MG/ML IV SOLN
15.0000 mL | Freq: Once | INTRAVENOUS | Status: AC | PRN
  Administered 2017-08-30: 15 mL via INTRAVENOUS

## 2017-09-04 DIAGNOSIS — M5137 Other intervertebral disc degeneration, lumbosacral region: Secondary | ICD-10-CM | POA: Diagnosis not present

## 2017-09-04 DIAGNOSIS — M5136 Other intervertebral disc degeneration, lumbar region: Secondary | ICD-10-CM | POA: Diagnosis not present

## 2017-09-04 DIAGNOSIS — M9903 Segmental and somatic dysfunction of lumbar region: Secondary | ICD-10-CM | POA: Diagnosis not present

## 2017-09-04 DIAGNOSIS — M5431 Sciatica, right side: Secondary | ICD-10-CM | POA: Diagnosis not present

## 2017-09-11 DIAGNOSIS — M5136 Other intervertebral disc degeneration, lumbar region: Secondary | ICD-10-CM | POA: Diagnosis not present

## 2017-09-11 DIAGNOSIS — M9903 Segmental and somatic dysfunction of lumbar region: Secondary | ICD-10-CM | POA: Diagnosis not present

## 2017-09-11 DIAGNOSIS — M5137 Other intervertebral disc degeneration, lumbosacral region: Secondary | ICD-10-CM | POA: Diagnosis not present

## 2017-09-11 DIAGNOSIS — M5431 Sciatica, right side: Secondary | ICD-10-CM | POA: Diagnosis not present

## 2017-09-13 ENCOUNTER — Ambulatory Visit (INDEPENDENT_AMBULATORY_CARE_PROVIDER_SITE_OTHER): Payer: Medicare Other | Admitting: Cardiovascular Disease

## 2017-09-13 ENCOUNTER — Encounter: Payer: Self-pay | Admitting: Cardiovascular Disease

## 2017-09-13 VITALS — BP 127/75 | HR 59 | Ht 65.0 in | Wt 154.0 lb

## 2017-09-13 DIAGNOSIS — E785 Hyperlipidemia, unspecified: Secondary | ICD-10-CM

## 2017-09-13 DIAGNOSIS — R413 Other amnesia: Secondary | ICD-10-CM | POA: Diagnosis not present

## 2017-09-13 DIAGNOSIS — Z79899 Other long term (current) drug therapy: Secondary | ICD-10-CM

## 2017-09-13 DIAGNOSIS — I10 Essential (primary) hypertension: Secondary | ICD-10-CM | POA: Diagnosis not present

## 2017-09-13 DIAGNOSIS — G4733 Obstructive sleep apnea (adult) (pediatric): Secondary | ICD-10-CM | POA: Diagnosis not present

## 2017-09-13 MED ORDER — EZETIMIBE 10 MG PO TABS: 10.0000 mg | ORAL_TABLET | Freq: Every day | ORAL | 3 refills | Status: DC

## 2017-09-13 MED ORDER — LISINOPRIL 5 MG PO TABS: 5.0000 mg | ORAL_TABLET | Freq: Every day | ORAL | 3 refills | Status: DC

## 2017-09-13 NOTE — Progress Notes (Signed)
Patient ID: BROCKTON MCKESSON, male   DOB: September 09, 1940, 77 y.o.   MRN: [id number]     HPI: Carlos Pierce is a 77 y.o. male who presents to the office today for a 15 month cardiology evaluation.  Mr. Coffelt  has a history of mild hypertension and hyperlipidemia. Remotely, he has had mild elevation of PSAs which have been followed by Drs [name] and more recently Dr. [name].  He has had mild transaminase elevation in the past on Crestor.  In 2013 laboratory revealed  a total cholesterol 213 triglycerides 136 HDL 46 LDL 140. PSA was 3.19. Normal LFTs.  He has tolerated  Livalo 2 mg in addition to Zetia 10 mg. Repeat blood work on 01/12/2013 showed a total cholesterol 144 HDL 55 LDL 70 VLDL 19 triglycerides 96. He is tolerating the medicine well without side effects. Liver function studies have remained normal; PSA  decreased at 2.61. He remains active and exercises daily. Over the past 6 years he has lost 15-20 pounds as a result of his activity and improve diet.  He has continued to exercise at least  5 days per week.  He exercises on a stationary bike for over an hour for approximately 18 miles 3 days per week and on alternating days does low resistance training.  He had undergone a colonoscopy by Dr. [name].He denies chest pain PND or orthopnea. He denies palpitations. He denies myalgias.  He had experienced one episode where he said up very abruptly and did have transient dizziness where he stumbled.  He has not had recurrence of this.  He denied associated palpitations.  He denied vertigo.  In December 2016 lipid studies were excellent with a total cholesterol 141, triglycerides 96, HDL 47 and LDL 75.  PSA was 3.54.   In 06/29/2016  CBC was normal.  PSA was 2.7.  BUN27, creatinine 1.17.  LFTs were normal.  Lipid studies continue to be excellent with a total cholesterol of 146, triglycerides 1:30, HDL 51, and LDL 69.   Since I last saw him in January 2018, he has had some  memory issues.  He also had some development of lower back discomfort.  He held off exercise for approximately 2 months but is now back exercising again.  He denies chest pain or shortness of breath.  He has now required hearing aids.  He underwent a sleep evaluation by the neurologist and was found to have mild sleep apnea.  Repeat blood pressure was 118/64 he uses CPAP for obstructive sleep apnea and admits to 100% compliance.  Laboratory in January 2019 showed a total cholesterol 173, HDL 56, LDL 94 and triglycerides 130.  He continues to be on Zetia and is on pravastatin 40 mg.  He has tried Prevident for his short-term memory loss with plus minus benefit.  He continues to be on lisinopril 5 mg for hypertension.  GERD is controlled with Zantac.  He presents for reevaluation.  Current Outpatient Medications  Medication Sig Dispense Refill  . Apoaequorin (PREVAGEN PO) Take 20 mg by mouth daily.    Marland Kitchen aspirin 81 MG tablet Take 81 mg by mouth daily.    Raelyn Ensign Pollen 1000 MG TABS Take 1 tablet by mouth daily.    . Bran 500 MG TABS Take 1 tablet by mouth 2 (two) times daily.    . Coenzyme Q10 (CO Q 10 PO) Take 300 mg by mouth daily.    Marland Kitchen ezetimibe (ZETIA) 10 MG tablet Take 1 tablet (10 mg  total) by mouth daily. 90 tablet 3  . Ginkgo Biloba 40 MG TABS Take 240 mg by mouth daily.    Marland Kitchen L-Tyrosine 500 MG CAPS Take 1 capsule by mouth daily.    Marland Kitchen lisinopril (PRINIVIL,ZESTRIL) 5 MG tablet Take 1 tablet (5 mg total) by mouth daily. 90 tablet 3  . Lutein-Zeaxanthin 25-5 MG CAPS Take 1 capsule by mouth daily.    . Milk Thistle 1000 MG CAPS Take 1 capsule by mouth daily.    . Multiple Vitamins-Minerals (MULTIVITAMIN & MINERAL PO) Take 1 tablet by mouth daily.    . Omega-3 Fatty Acids (OMEGA 3 PO) Take 1,000 mg by mouth daily.    . ranitidine (ZANTAC) 150 MG capsule Take by mouth.    . tamsulosin (FLOMAX) 0.4 MG CAPS capsule      No current facility-administered medications for this visit.     Social History     Socioeconomic History  . Marital status: Married    Spouse name: [name]  . Number of children: 1  . Years of education: 25  . Highest education level: Not on file  Occupational History    Comment: [employer]  . Financial resource strain: Not on file  . Food insecurity:    Worry: Not on file    Inability: Not on file  . Transportation needs:    Medical: Not on file    Non-medical: Not on file  Tobacco Use  . Smoking status: Never Smoker  . Smokeless tobacco: Never Used  Substance and Sexual Activity  . Alcohol use: Yes    Alcohol/week: 1.0 oz    Types: 2 Standard drinks or equivalent per week    Comment: wine a few days a week  . Drug use: No  . Sexual activity: Not on file  Lifestyle  . Physical activity:    Days per week: Not on file    Minutes per session: Not on file  . Stress: Not on file  Relationships  . Social connections:    Talks on phone: Not on file    Gets together: Not on file    Attends religious service: Not on file    Active member of club or organization: Not on file    Attends meetings of clubs or organizations: Not on file    Relationship status: Not on file  . Intimate partner violence:    Fear of current or ex partner: Not on file    Emotionally abused: Not on file    Physically abused: Not on file    Forced sexual activity: Not on file  Other Topics Concern  . Not on file  Social History Narrative   Drinks 1 cup of coffee a day     Additional social history is notable in that he is married has one child who is also my patient who has a history of hyperlipidemia.  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  ROS General: Negative; No fevers, chills, or night sweats;  HEENT:  now using a hearing aid, no changes in vision, sinus congestion, difficulty swallowing Pulmonary: Negative; No cough, wheezing, shortness of breath, hemoptysis Cardiovascular: Negative; No chest pain, presyncope, syncope,  palpitations GI: Negative; No nausea, vomiting, diarrhea, or abdominal pain GU: Negative; No dysuria, hematuria, or difficulty voiding Musculoskeletal: Negative; no myalgias, joint pain, or weakness Hematologic/Oncology: Negative; no easy bruising, bleeding Endocrine: Negative; no heat/cold intolerance; no diabetes Neuro: Some short-term memory loss for which she has  been evaluated by neurology Skin: Negative; No rashes or skin lesions Psychiatric: Negative; No behavioral problems, depression Sleep:  on CPAP for OSA; No snoring, daytime sleepiness, hypersomnolence, bruxism, restless legs, hypnogognic hallucinations, no cataplexy Other comprehensive 14 point system review is negative.   PE BP 127/75   Pulse (!) 59   Ht 5' 5"  (1.651 m)   Wt 154 lb (69.9 kg)   BMI 25.63 kg/m    Repeat blood pressure was 118/64 Wt Readings from Last 3 Encounters:  09/13/17 154 lb (69.9 kg)  05/23/17 158 lb 6.4 oz (71.8 kg)  01/08/17 158 lb (71.7 kg)   General: Alert, oriented, no distress.  Skin: normal turgor, no rashes, warm and dry HEENT: Normocephalic, atraumatic. Pupils equal round and reactive to light; sclera anicteric; extraocular muscles intact; Fundi\ Nose without nasal septal hypertrophy Mouth/Parynx benign; Mallinpatti scale 3 Neck: No JVD, no carotid bruits; normal carotid upstroke Lungs: clear to ausculatation and percussion; no wheezing or rales Chest wall: without tenderness to palpitation Heart: PMI not displaced, RRR, s1 s2 normal, 1/6 systolic murmur, no diastolic murmur, no rubs, gallops, thrills, or heaves Abdomen: soft, nontender; no hepatosplenomehaly, BS+; abdominal aorta nontender and not dilated by palpation. Back: no CVA tenderness Pulses 2+ Musculoskeletal: full range of motion, normal strength, no joint deformities Extremities: no clubbing cyanosis or edema, Homan's sign negative  Neurologic: grossly nonfocal; Cranial nerves grossly wnl Psychologic: Normal mood and  affect   ECG (independently read by me): Normal sinus rhythm at 64 bpm.  PR interval 156 bili seconds.  QTc interval 456 ms.  January 2018 ECG (independently read by me):  Normal sinus rhythm at 67 bpm.  No ectopy.  Q wave in lead 3 and aVF with preserved R waves.  January 2016ECG: (Independently read by me): Normal sinus rhythm at 72 bpm.  No ectopy.  Normal intervals.  January 2015 ECG: (Independently read by me): Normal sinus rhythm at 71 beats per minute. No ST changes. Normal intervals.   LABS: BMP Latest Ref Rng & Units 06/29/2016 06/03/2015 06/07/2014  Glucose 65 - 99 mg/dL 103(H) 105(H) 103(H)  BUN 7 - 25 mg/dL 27(H) 27(H) 26(H)  Creatinine 0.70 - 1.18 mg/dL 1.17 1.16 1.09  Sodium 135 - 146 mmol/L 141 139 140  Potassium 3.5 - 5.3 mmol/L 4.4 4.1 4.7  Chloride 98 - 110 mmol/L 107 107 107  CO2 20 - 31 mmol/L 26 25 26   Calcium 8.6 - 10.3 mg/dL 9.7 9.1 9.8   Hepatic Function Latest Ref Rng & Units 07/08/2017 06/29/2016 06/03/2015  Total Protein 6.1 - 8.1 g/dL 7.0 6.9 6.3  Albumin 3.6 - 5.1 g/dL - 4.4 4.1  AST 10 - 35 U/L 23 25 19   ALT 9 - 46 U/L 37 35 25  Alk Phosphatase 40 - 115 U/L - 48 37(L)  Total Bilirubin 0.2 - 1.2 mg/dL 0.6 0.7 0.5  Bilirubin, Direct 0.0 - 0.2 mg/dL 0.1 - -   CBC Latest Ref Rng & Units 06/29/2016 06/03/2015 06/07/2014  WBC 3.8 - 10.8 K/uL 5.9 5.6 5.7  Hemoglobin 13.2 - 17.1 g/dL 13.6 13.4 13.6  Hematocrit 38.5 - 50.0 % 40.5 39.7 42.0  Platelets 140 - 400 K/uL 167 146(L) 144(L)   Lab Results  Component Value Date   MCV 90.2 06/29/2016   MCV 89.4 06/03/2015   MCV 90.9 06/07/2014    No results found for: HGBA1C  Lab Results  Component Value Date   TSH 2.790 07/23/2016   Lipid Panel  Component Value Date/Time   CHOL 173 07/08/2017 0803   TRIG 130 07/08/2017 0803   HDL 56 07/08/2017 0803   CHOLHDL 3.1 07/08/2017 0803   VLDL 26 06/29/2016 1021   LDLCALC 94 07/08/2017 0803     RADIOLOGY: No results found.  IMPRESSION:  1.  Hyperlipidemia, unspecified hyperlipidemia type   2. Essential hypertension, benign   3. OSA (obstructive sleep apnea)   4. Short-term memory loss   5. Medication management     ASSESSMENT AND PLAN: Mr. Wessinger is a 77 year old gentleman who has a history of hypertension and hyperlipidemia. He has lost approximately 20 pounds over the last 8 years.  He has remained relatively active and denies chest pain or shortness of breath.  When I last saw him he was on Zetia and Livalo.  Apparently Livalo was discontinued possibly due to insurance and he is now on pravastatin.  He has had some issues with short-term memory loss and the neurologist question whether or not this could be statin related.   As result, he has been off pravastatin for at least 2 months  An MRI of his brain had shown mild changes of chronic microvascular ischemia, generalized cerebral atrophy, and mild changes of chronic paranasal sinus inflammation.  He is now on  CPAP therapy for obstructive sleep apnea.  his sleep study revealed a total AHI of 7.2/h, rem AHI of 12.7/h and oxygen nadir of 78%.  He continues to do well and exercises regularly. He is tolerating combination therapy with Zetia 10 mg and Livalo 2 mg daily .  Most recent lipid studies are stable in January 2019, but since that time he has not been taking his pravastatin I will see him in 6 months for reevaluation..  Lisinopril at 5 mg.  He takes Zantac for GERD.  I am recommending repeat laboratory with a chemistry profile and fasting lipid studies since he has been off statin therapy.  I will see him in 6 months for reevaluation.  Time spent: 25 minutes  Troy Sine, MD, [facility]  09/15/2017 9:47 AM

## 2017-09-13 NOTE — Patient Instructions (Signed)
Medication Instructions:  Your physician recommends that you continue on your current medications as directed. Please refer to the Current Medication list given to you today.  Labwork: Please return for FASTING labs (CMET, Lipid)  Our in office lab hours are Monday-Friday 8:00-4:00, closed for lunch 12:45-1:45 pm.  No appointment needed.  Follow-Up: Your physician wants you to follow-up in: 6 months with Dr. Kelly.  You will receive a reminder letter in the mail two months in advance. If you don't receive a letter, please call our office to schedule the follow-up appointment.   Any Other Special Instructions Will Be Listed Below (If Applicable).     If you need a refill on your cardiac medications before your next appointment, please call your pharmacy.   

## 2017-09-15 ENCOUNTER — Encounter: Payer: Self-pay | Admitting: Cardiovascular Disease

## 2017-09-18 DIAGNOSIS — M5431 Sciatica, right side: Secondary | ICD-10-CM | POA: Diagnosis not present

## 2017-09-18 DIAGNOSIS — M5136 Other intervertebral disc degeneration, lumbar region: Secondary | ICD-10-CM | POA: Diagnosis not present

## 2017-09-18 DIAGNOSIS — M9903 Segmental and somatic dysfunction of lumbar region: Secondary | ICD-10-CM | POA: Diagnosis not present

## 2017-09-18 DIAGNOSIS — M5137 Other intervertebral disc degeneration, lumbosacral region: Secondary | ICD-10-CM | POA: Diagnosis not present

## 2017-09-19 NOTE — Addendum Note (Signed)
Addended by: Vennie Homans on: 09/19/2017 07:15 AM   Modules accepted: Orders

## 2017-09-24 DIAGNOSIS — Z79899 Other long term (current) drug therapy: Secondary | ICD-10-CM | POA: Diagnosis not present

## 2017-09-24 DIAGNOSIS — E785 Hyperlipidemia, unspecified: Secondary | ICD-10-CM | POA: Diagnosis not present

## 2017-09-24 LAB — LIPID PANEL
CHOL/HDL RATIO: 4 ratio (ref 0.0–5.0)
CHOLESTEROL TOTAL: 206 mg/dL — AB (ref 100–199)
HDL: 52 mg/dL (ref >39)
LDL CALC: 135 mg/dL — AB (ref 0–99)
Triglycerides: 93 mg/dL (ref 0–149)
VLDL Cholesterol Cal: 19 mg/dL (ref 5–40)

## 2017-09-24 LAB — COMPREHENSIVE METABOLIC PANEL
A/G RATIO: 2.2 (ref 1.2–2.2)
ALBUMIN: 4.6 g/dL (ref 3.5–4.8)
ALK PHOS: 44 IU/L (ref 39–117)
ALT: 31 IU/L (ref 0–44)
AST: 22 IU/L (ref 0–40)
BUN / CREAT RATIO: 21 (ref 10–24)
BUN: 23 mg/dL (ref 8–27)
Bilirubin Total: 0.3 mg/dL (ref 0.0–1.2)
CO2: 22 mmol/L (ref 20–29)
CREATININE: 1.11 mg/dL (ref 0.76–1.27)
Calcium: 9.7 mg/dL (ref 8.6–10.2)
Chloride: 106 mmol/L (ref 96–106)
GFR calc Af Amer: 75 mL/min/{1.73_m2} (ref >59)
GFR, EST NON AFRICAN AMERICAN: 65 mL/min/{1.73_m2} (ref >59)
GLOBULIN, TOTAL: 2.1 g/dL (ref 1.5–4.5)
Glucose: 103 mg/dL — ABNORMAL HIGH (ref 65–99)
Potassium: 4.7 mmol/L (ref 3.5–5.2)
SODIUM: 142 mmol/L (ref 134–144)
Total Protein: 6.7 g/dL (ref 6.0–8.5)

## 2017-10-02 DIAGNOSIS — M5431 Sciatica, right side: Secondary | ICD-10-CM | POA: Diagnosis not present

## 2017-10-02 DIAGNOSIS — M5137 Other intervertebral disc degeneration, lumbosacral region: Secondary | ICD-10-CM | POA: Diagnosis not present

## 2017-10-02 DIAGNOSIS — M9903 Segmental and somatic dysfunction of lumbar region: Secondary | ICD-10-CM | POA: Diagnosis not present

## 2017-10-02 DIAGNOSIS — M5136 Other intervertebral disc degeneration, lumbar region: Secondary | ICD-10-CM | POA: Diagnosis not present

## 2017-10-11 ENCOUNTER — Telehealth: Payer: Self-pay | Admitting: Diagnostic Neuroimaging

## 2017-10-11 MED ORDER — MEMANTINE HCL 10 MG PO TABS: 10.0000 mg | ORAL_TABLET | Freq: Two times a day (BID) | ORAL | 12 refills | Status: DC

## 2017-10-11 NOTE — Telephone Encounter (Signed)
Spoke to wife and relayed that medication prescribed memantine 10mg  po bid and escribed to pharmacy [facility] [place].  She was very Patent attorney.

## 2017-10-11 NOTE — Addendum Note (Signed)
Addended by: Andrey Spearman R on: 10/11/2017 12:48 PM   Modules accepted: Orders

## 2017-10-11 NOTE — Telephone Encounter (Signed)
memantine 10mg  twice a day sent in. -VRP

## 2017-10-11 NOTE — Telephone Encounter (Addendum)
I spoke to wife and she is asking for a prescription for memantine (if ok with you) they researched both donepezil and memantine and feel like memantine would work better for him.  She feels like he is declining and would like to start and then see if this would make difference (see him in June 2019).

## 2017-10-11 NOTE — Telephone Encounter (Signed)
Pt wife(on DPR) has called with questions re: recommendations made by Dr [name] in Dec of 2018 please call

## 2017-10-17 ENCOUNTER — Encounter: Payer: Self-pay | Admitting: *Deleted

## 2017-10-17 ENCOUNTER — Other Ambulatory Visit: Payer: Self-pay | Admitting: *Deleted

## 2017-10-17 DIAGNOSIS — Z79899 Other long term (current) drug therapy: Secondary | ICD-10-CM

## 2017-10-17 DIAGNOSIS — E785 Hyperlipidemia, unspecified: Secondary | ICD-10-CM

## 2017-10-17 MED ORDER — PRAVASTATIN SODIUM 40 MG PO TABS: 40.0000 mg | ORAL_TABLET | Freq: Every evening | ORAL | 3 refills | Status: DC

## 2017-11-22 DIAGNOSIS — H90A31 Mixed conductive and sensorineural hearing loss, unilateral, right ear with restricted hearing on the contralateral side: Secondary | ICD-10-CM | POA: Diagnosis not present

## 2017-11-22 DIAGNOSIS — H90A22 Sensorineural hearing loss, unilateral, left ear, with restricted hearing on the contralateral side: Secondary | ICD-10-CM | POA: Diagnosis not present

## 2017-11-26 ENCOUNTER — Encounter: Payer: Self-pay | Admitting: Diagnostic Neuroimaging

## 2017-11-26 ENCOUNTER — Ambulatory Visit (INDEPENDENT_AMBULATORY_CARE_PROVIDER_SITE_OTHER): Payer: Medicare Other | Admitting: Diagnostic Neuroimaging

## 2017-11-26 VITALS — BP 131/75 | HR 64 | Ht 65.0 in | Wt 156.8 lb

## 2017-11-26 DIAGNOSIS — G4733 Obstructive sleep apnea (adult) (pediatric): Secondary | ICD-10-CM

## 2017-11-26 DIAGNOSIS — Z9989 Dependence on other enabling machines and devices: Secondary | ICD-10-CM | POA: Diagnosis not present

## 2017-11-26 DIAGNOSIS — H9192 Unspecified hearing loss, left ear: Secondary | ICD-10-CM | POA: Diagnosis not present

## 2017-11-26 DIAGNOSIS — R413 Other amnesia: Secondary | ICD-10-CM

## 2017-11-26 MED ORDER — MEMANTINE HCL 10 MG PO TABS: 10.0000 mg | ORAL_TABLET | Freq: Two times a day (BID) | ORAL | 4 refills | Status: DC

## 2017-11-26 NOTE — Patient Instructions (Addendum)
MEMORY LOSS - continue memantine - consider clinical research study - brain healthy activities reviewed (nutrition, exercise, cognitive stimulation, sleep) - review AARP "prepare to care" resource online  HEARING LOSS - continue hearing aid usage  OSA - continue CPAP

## 2017-11-26 NOTE — Progress Notes (Signed)
[facility]   PATIENT: Carlos Pierce DOB: [birth year]  REFERRING CLINICIAN: Earlean Pierce / Carlos Pierce HISTORY FROM: patient and wife  REASON FOR VISIT: follow up    HISTORICAL  CHIEF COMPLAINT:  Chief Complaint  Patient presents with  . Memory Loss    rm 7, wifeManuela Pierce  MMSE 22, "I am doing so much better on Namenda"  . Follow-up    6 month    HISTORY OF PRESENT ILLNESS:   UPDATE (11/26/17, VRP): Since last visit, doing well. Tolerating memantine. No alleviating or aggravating factors. Feels like memory is slightly better. ADLs stable.   UPDATE (05/23/17, VRP): Since last visit, doing well per patient. Wife thinks there has been mild decline over last year, esp slower with conversations and losing train of thought and driving directions. No alleviating or aggravating factors. Eating well. No major change in ADLs. He stopped BP meds and chol meds to see if memory loss would improve, but he is not sure if there has been any change.   UPDATE 10/29/16: Since last visit, doing well. Memory loss stable. Now trying to stay calm when he forgets, and then the name or thought returns. Also with mild OSA, planning to start CPAP next month.   PRIOR HPI (07/23/16): 77 year old right-handed male here for evaluation of memory loss. Patient reports 1-2 months of mild short-term memory loss, losing track of his train of thought, word finding difficulties, difficulty with name recall, forgetting how to stay on task. 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 Otherwise he is able to maintain all of his activities of daily living. He takes care of paying the bills and finances at home. He does light chores around the house. Patient's wife reports at least one year of duration of the symptoms. She also notes that he is having more trouble with driving directions, recall of people's names, telling a story and then getting off track at the end. Patient is otherwise very active  physically. He exercises 4-5 times per week.   REVIEW OF SYSTEMS: Full 14 system review of systems performed and negative with exception of: sleep apnea snoring hearing loss.    ALLERGIES: No Known Allergies  HOME MEDICATIONS: Outpatient Medications Prior to Visit  Medication Sig Dispense Refill  . Apoaequorin (PREVAGEN PO) Take 20 mg by mouth daily.    Raelyn Ensign Pollen 1000 MG TABS Take 1 tablet by mouth daily.    . Bran 500 MG TABS Take 1 tablet by mouth 2 (two) times daily.    . Coenzyme Q10 (CO Q 10 PO) Take 300 mg by mouth daily.    Marland Kitchen ezetimibe (ZETIA) 10 MG tablet Take 1 tablet (10 mg total) by mouth daily. 90 tablet 3  . Ginkgo Biloba 40 MG TABS Take 240 mg by mouth daily.    Marland Kitchen L-Tyrosine 500 MG CAPS Take 1 capsule by mouth daily.    Marland Kitchen lisinopril (PRINIVIL,ZESTRIL) 5 MG tablet Take 1 tablet (5 mg total) by mouth daily. 90 tablet 3  . Lutein-Zeaxanthin 25-5 MG CAPS Take 1 capsule by mouth daily.    . memantine (NAMENDA) 10 MG tablet Take 1 tablet (10 mg total) by mouth 2 (two) times daily. 60 tablet 12  . Milk Thistle 1000 MG CAPS Take 1 capsule by mouth daily.    . Multiple Vitamins-Minerals (MULTIVITAMIN & MINERAL PO) Take 1 tablet by mouth daily.    . Omega-3 Fatty Acids (OMEGA 3 PO) Take 1,000 mg by mouth  daily.    . pravastatin (PRAVACHOL) 40 MG tablet Take 1 tablet (40 mg total) by mouth every evening. 90 tablet 3  . ranitidine (ZANTAC) 150 MG capsule Take by mouth.    . tamsulosin (FLOMAX) 0.4 MG CAPS capsule     . aspirin 81 MG tablet Take 81 mg by mouth daily.     No facility-administered medications prior to visit.     PAST MEDICAL HISTORY: Past Medical History:  Diagnosis Date  . Memory loss     PAST SURGICAL HISTORY: Past Surgical History:  Procedure Laterality Date  . CATARACT EXTRACTION Bilateral   . HEMORRHOID BANDING  2016    FAMILY HISTORY: No family history on file.  SOCIAL HISTORY:  Social History   Socioeconomic History  . Marital status:  Married    Spouse name: Carlos Pierce  . Number of children: 1  . Years of education: 62  . Highest education level: Not on file  Occupational History    Comment: [employer] and [employer]  . Financial resource strain: Not on file  . Food insecurity:    Worry: Not on file    Inability: Not on file  . Transportation needs:    Medical: Not on file    Non-medical: Not on file  Tobacco Use  . Smoking status: Never Smoker  . Smokeless tobacco: Never Used  Substance and Sexual Activity  . Alcohol use: Yes    Alcohol/week: 1.2 oz    Types: 2 Standard drinks or equivalent per week    Comment: wine a few days a week  . Drug use: No  . Sexual activity: Not on file  Lifestyle  . Physical activity:    Days per week: Not on file    Minutes per session: Not on file  . Stress: Not on file  Relationships  . Social connections:    Talks on phone: Not on file    Gets together: Not on file    Attends religious service: Not on file    Active member of club or organization: Not on file    Attends meetings of clubs or organizations: Not on file    Relationship status: Not on file  . Intimate partner violence:    Fear of current or ex partner: Not on file    Emotionally abused: Not on file    Physically abused: Not on file    Forced sexual activity: Not on file  Other Topics Concern  . Not on file  Social History Narrative   Drinks 1 cup of coffee a day      PHYSICAL EXAM  GENERAL EXAM/CONSTITUTIONAL: Vitals:  Vitals:   11/26/17 0911  BP: 131/75  Pulse: 64  Weight: 156 lb 12.8 oz (71.1 kg)  Height: 5\' 5"  (1.651 m)   Body mass index is 26.09 kg/m. No exam data present  Patient is in no distress; well developed, nourished and groomed; neck is supple  CARDIOVASCULAR:  Examination of carotid arteries is normal; no carotid bruits  Regular rate and rhythm, no murmurs  Examination of peripheral vascular system by observation and palpation is  normal  EYES:  Ophthalmoscopic exam of optic discs and posterior segments is normal; no papilledema or hemorrhages  MUSCULOSKELETAL:  Gait, strength, tone, movements noted in Neurologic exam below  NEUROLOGIC: MENTAL STATUS:  MMSE - Mini Mental State Exam 11/26/2017 05/23/2017 10/29/2016  Orientation to time 3 3 5   Orientation to Place 3 4 5   Registration 3 3 3  Attention/ Calculation 5 4 5   Recall 0 1 3  Language- name 2 objects 2 2 2   Language- repeat 1 1 1   Language- follow 3 step command 3 3 3   Language- read & follow direction 1 1 1   Write a sentence 1 1 1   Copy design 0 0 0  Total score 22 23 29     awake, alert, oriented to person, place and time  recent and remote memory intact  normal attention and concentration  language fluent, comprehension intact, naming intact,   fund of knowledge appropriate  CRANIAL NERVE:   2nd - no papilledema on fundoscopic exam  2nd, 3rd, 4th, 6th - pupils equal and reactive to light, visual fields full to confrontation, extraocular muscles intact, no nystagmus  5th - facial sensation symmetric  7th - facial strength symmetric  8th - hearing intact  9th - palate elevates symmetrically, uvula midline  11th - shoulder shrug symmetric  12th - tongue protrusion midline  MOTOR:   normal bulk and tone, full strength in the BUE, BLE  SENSORY:   normal and symmetric to light touch, temperature, vibration  COORDINATION:   finger-nose-finger, fine finger movements normal  REFLEXES:   deep tendon reflexes present and symmetric  GAIT/STATION:   narrow based gait    DIAGNOSTIC DATA (LABS, IMAGING, TESTING) - I reviewed patient records, labs, notes, testing and imaging myself where available.  Lab Results  Component Value Date   WBC 5.9 06/29/2016   HGB 13.6 06/29/2016   HCT 40.5 06/29/2016   MCV 90.2 06/29/2016   PLT 167 06/29/2016      Component Value Date/Time   NA 142 09/24/2017 0814   K 4.7 09/24/2017  0814   CL 106 09/24/2017 0814   CO2 22 09/24/2017 0814   GLUCOSE 103 (H) 09/24/2017 0814   GLUCOSE 103 (H) 06/29/2016 1021   BUN 23 09/24/2017 0814   CREATININE 1.11 09/24/2017 0814   CREATININE 1.17 06/29/2016 1021   CALCIUM 9.7 09/24/2017 0814   PROT 6.7 09/24/2017 0814   ALBUMIN 4.6 09/24/2017 0814   AST 22 09/24/2017 0814   ALT 31 09/24/2017 0814   ALKPHOS 44 09/24/2017 0814   BILITOT 0.3 09/24/2017 0814   GFRNONAA 65 09/24/2017 0814   GFRAA 75 09/24/2017 0814   Lab Results  Component Value Date   CHOL 206 (H) 09/24/2017   HDL 52 09/24/2017   LDLCALC 135 (H) 09/24/2017   TRIG 93 09/24/2017   CHOLHDL 4.0 09/24/2017   No results found for: HGBA1C Lab Results  Component Value Date   VITAMINB12 815 07/23/2016   Lab Results  Component Value Date   TSH 2.790 07/23/2016    10/13/14 MRI brain [I reviewed images myself and agree with interpretation. -VRP]  1. No acute or focal lesion to explain left-sided hearing loss. 2. Right mastoid effusion. No obstructing nasopharyngeal lesion is evident.  08/07/16 MRI brain [I reviewed images myself and agree with interpretation. -VRP]  - showing mild changes of chronic microvascular ischemia, generalized cerebral atrophy and mild changes of chronic paranasal sinus inflammation. Overall no significant change compared with previous MRI scan dated 10/13/2014.  09/02/16 PSG 1. This study demonstrates overall mild obstructive sleep apnea, with a total AHI of 7.2/hour, REM AHI of 12.7/hour, and O2 nadir of 78%. Given the patient's medical history and sleep related complaints, as well as desaturations into the 80s and as low as 78%, treatment with positive airway pressure in the form of CPAP is recommended; this will  require a full night titration study to optimize therapy.  2. Moderate PLMs (periodic limb movements of sleep) were noted during the study with minimalarousals; clinical correlation is recommended. 3. This study shows sleep  fragmentation and abnormal sleep stage percentages; these are nonspecificfindings and per se do not signify an intrinsic sleep disorder or a cause for the patient's sleep-related symptoms. Causes include (but are not limited to) the first night effect of the sleep study,circadian rhythm disturbances, medication effect or an underlying mood disorder or medical problem.   10/07/16 CPAP titration  - This study demonstrates near complete resolution of the patient's obstructive sleep apnea with CPAP therapy.    ASSESSMENT AND PLAN  77 y.o. year old male here with mild memory loss since 2017, mainly short term, recall, names, directions.    Ddx: mild cognitive impairement vs prodromal dementia  1. Memory loss   2. Hearing loss of left ear, unspecified hearing loss type   3. Obstructive sleep apnea treated with continuous positive airway pressure (CPAP)      PLAN:  MEMORY LOSS - continue memantine 10mg  twice a day; may consider donepezil in future - consider clinical research study - brain healthy activities reviewed (nutrition, exercise, cognitive stimulation, sleep)  HEARING LOSS - continue hearing aid usage  OSA - continue CPAP  Meds ordered this encounter  Medications  . memantine (NAMENDA) 10 MG tablet    Sig: Take 1 tablet (10 mg total) by mouth 2 (two) times daily.    Dispense:  180 tablet    Refill:  4   Return in about 1 year (around 11/27/2018) for with NP.    Penni Bombard, MD 09/01/5571, 2:20 AM Certified in Neurology, Neurophysiology and [facility] [address] [place], [place] [address] [phone]

## 2017-11-29 ENCOUNTER — Telehealth: Payer: Self-pay | Admitting: Cardiovascular Disease

## 2017-11-29 DIAGNOSIS — R972 Elevated prostate specific antigen [PSA]: Secondary | ICD-10-CM | POA: Diagnosis not present

## 2017-11-29 DIAGNOSIS — H9313 Tinnitus, bilateral: Secondary | ICD-10-CM | POA: Diagnosis not present

## 2017-11-29 DIAGNOSIS — H903 Sensorineural hearing loss, bilateral: Secondary | ICD-10-CM | POA: Diagnosis not present

## 2017-11-29 NOTE — Telephone Encounter (Signed)
Returned call to patient's wife. He has been on pravastatin since May 9. He has been having symptoms of confusion, memory loss, irritability. He stopped pravastatin yesterday. Advised to hold medication. Will route to MD/RN to review

## 2017-11-29 NOTE — Telephone Encounter (Signed)
Patient's wife returned call. Made her aware that MD was notified. Per [facility] RN, should be OK to hold statin until next visit with MD. Wife states she agrees w/this. Patients memory was doing well on namenda and then once statin was started about 1 month ago his symptoms worsened.

## 2017-11-29 NOTE — Telephone Encounter (Signed)
Pt's wife calling regarding Pravastatin 40mg , and he is having confusion, memory loss, and irritability, would like to change med-pls advise

## 2017-11-29 NOTE — Telephone Encounter (Signed)
Patient has had some issues with memory loss.  He reportedly has seen neurology this week for evaluation.

## 2017-11-29 NOTE — Telephone Encounter (Signed)
LMTCB

## 2017-12-06 DIAGNOSIS — R972 Elevated prostate specific antigen [PSA]: Secondary | ICD-10-CM | POA: Diagnosis not present

## 2017-12-06 DIAGNOSIS — N400 Enlarged prostate without lower urinary tract symptoms: Secondary | ICD-10-CM | POA: Diagnosis not present

## 2018-02-27 ENCOUNTER — Telehealth: Payer: Self-pay | Admitting: Diagnostic Neuroimaging

## 2018-02-27 NOTE — Telephone Encounter (Signed)
Pts wife(Susan) called stating the pt had been doing well on medication memantine (NAMENDA) 10 MG tablet but within the last month the pts memory has declined. Requesting a call on how to move forward if medication should be increased or the pt needing to be seen sooner

## 2018-02-27 NOTE — Telephone Encounter (Signed)
Likely represent excepted progression. May consider adding on donepezil. -VRP

## 2018-02-27 NOTE — Telephone Encounter (Signed)
Spoke with wife. She stated that she feels like he has gotten progressively worse in over the last several weeks.  Showing frustration that he cannot think of words (his fist on table), and getting them out, not responding as quickly has he used too.  He has been on the memantine 10mg  po bid and did well at the beginning and is now not so much.  She recalled at last visit of another medication that could be added.  Donepezil. I relayed that dementia/ disease progression is slowed by medications hopefully but not entirely.  I relayed would forward progress to Dr. [name] and get his recommendation on donepezil.  His next appt is 11/2018.

## 2018-02-28 MED ORDER — DONEPEZIL HCL 10 MG PO TABS: 10.0000 mg | ORAL_TABLET | Freq: Every day | ORAL | 4 refills | Status: DC

## 2018-02-28 NOTE — Addendum Note (Signed)
Addended by: Andrey Spearman R on: 02/28/2018 01:02 PM   Modules accepted: Orders

## 2018-02-28 NOTE — Telephone Encounter (Signed)
Spoke to wife and ok to add donepezil.  Order for 10mg  po daily at bedtime. To [facility].

## 2018-02-28 NOTE — Addendum Note (Signed)
Addended by: Oliver Hum S on: 02/28/2018 12:16 PM   Modules accepted: Orders

## 2018-03-03 DIAGNOSIS — Z79899 Other long term (current) drug therapy: Secondary | ICD-10-CM | POA: Diagnosis not present

## 2018-03-03 DIAGNOSIS — E785 Hyperlipidemia, unspecified: Secondary | ICD-10-CM | POA: Diagnosis not present

## 2018-03-03 LAB — COMPREHENSIVE METABOLIC PANEL
A/G RATIO: 1.9 (ref 1.2–2.2)
ALT: 22 IU/L (ref 0–44)
AST: 18 IU/L (ref 0–40)
Albumin: 4.3 g/dL (ref 3.5–4.8)
Alkaline Phosphatase: 51 IU/L (ref 39–117)
BILIRUBIN TOTAL: 0.6 mg/dL (ref 0.0–1.2)
BUN/Creatinine Ratio: 17 (ref 10–24)
BUN: 22 mg/dL (ref 8–27)
CO2: 23 mmol/L (ref 20–29)
CREATININE: 1.32 mg/dL — AB (ref 0.76–1.27)
Calcium: 9.7 mg/dL (ref 8.6–10.2)
Chloride: 106 mmol/L (ref 96–106)
GFR calc Af Amer: 61 mL/min/{1.73_m2} (ref >59)
GFR, EST NON AFRICAN AMERICAN: 52 mL/min/{1.73_m2} — AB (ref >59)
GLOBULIN, TOTAL: 2.3 g/dL (ref 1.5–4.5)
Glucose: 102 mg/dL — ABNORMAL HIGH (ref 65–99)
POTASSIUM: 4.8 mmol/L (ref 3.5–5.2)
Sodium: 141 mmol/L (ref 134–144)
Total Protein: 6.6 g/dL (ref 6.0–8.5)

## 2018-03-03 LAB — LIPID PANEL
CHOLESTEROL TOTAL: 261 mg/dL — AB (ref 100–199)
Chol/HDL Ratio: 5.2 ratio — ABNORMAL HIGH (ref 0.0–5.0)
HDL: 50 mg/dL (ref >39)
LDL Calculated: 180 mg/dL — ABNORMAL HIGH (ref 0–99)
TRIGLYCERIDES: 155 mg/dL — AB (ref 0–149)
VLDL CHOLESTEROL CAL: 31 mg/dL (ref 5–40)

## 2018-03-05 ENCOUNTER — Other Ambulatory Visit: Payer: Self-pay | Admitting: *Deleted

## 2018-03-05 DIAGNOSIS — Z79899 Other long term (current) drug therapy: Secondary | ICD-10-CM

## 2018-03-05 DIAGNOSIS — E785 Hyperlipidemia, unspecified: Secondary | ICD-10-CM

## 2018-05-12 DIAGNOSIS — R972 Elevated prostate specific antigen [PSA]: Secondary | ICD-10-CM | POA: Diagnosis not present

## 2018-05-21 DIAGNOSIS — R972 Elevated prostate specific antigen [PSA]: Secondary | ICD-10-CM | POA: Diagnosis not present

## 2018-05-21 DIAGNOSIS — N400 Enlarged prostate without lower urinary tract symptoms: Secondary | ICD-10-CM | POA: Diagnosis not present

## 2018-06-19 ENCOUNTER — Ambulatory Visit: Payer: Medicare Other | Admitting: Cardiovascular Disease

## 2018-08-05 ENCOUNTER — Telehealth: Payer: Self-pay | Admitting: Diagnostic Neuroimaging

## 2018-08-05 NOTE — Telephone Encounter (Signed)
No further medication adjustment for dementia. May follow up with PCP re: depression. -VRP

## 2018-08-05 NOTE — Telephone Encounter (Signed)
Pts wife states that he is stuttering a lot more and is also very depressed. She would like for pt to be seen sooner that his July appt and would also like to know if his medication would have to be adjusted. Please advise.

## 2018-08-05 NOTE — Telephone Encounter (Signed)
Called wife, [name], on [place] and advised her Dr [name] stated there are no further medication adjustments for his dementia. He advised she contact PCP to manage depression.  [name] stated she would still like him to be seen sooner than July. He was last seen June 2019. I rescheduled him for early June and advised her that if she has any concerns before FU to call office and discuss him being seen sooner. She verbalized understanding, appreciation.

## 2018-08-12 LAB — COMPREHENSIVE METABOLIC PANEL
ALT: 27 IU/L (ref 0–44)
AST: 22 IU/L (ref 0–40)
Albumin/Globulin Ratio: 2 (ref 1.2–2.2)
Albumin: 4.3 g/dL (ref 3.7–4.7)
Alkaline Phosphatase: 48 IU/L (ref 39–117)
BUN/Creatinine Ratio: 21 (ref 10–24)
BUN: 26 mg/dL (ref 8–27)
Bilirubin Total: 0.4 mg/dL (ref 0.0–1.2)
CO2: 21 mmol/L (ref 20–29)
CREATININE: 1.26 mg/dL (ref 0.76–1.27)
Calcium: 9.7 mg/dL (ref 8.6–10.2)
Chloride: 106 mmol/L (ref 96–106)
GFR, EST AFRICAN AMERICAN: 64 mL/min/{1.73_m2} (ref >59)
GFR, EST NON AFRICAN AMERICAN: 55 mL/min/{1.73_m2} — AB (ref >59)
GLUCOSE: 106 mg/dL — AB (ref 65–99)
Globulin, Total: 2.2 g/dL (ref 1.5–4.5)
POTASSIUM: 4.8 mmol/L (ref 3.5–5.2)
Sodium: 141 mmol/L (ref 134–144)
TOTAL PROTEIN: 6.5 g/dL (ref 6.0–8.5)

## 2018-08-12 LAB — LIPID PANEL
CHOL/HDL RATIO: 3.4 ratio (ref 0.0–5.0)
Cholesterol, Total: 175 mg/dL (ref 100–199)
HDL: 52 mg/dL (ref >39)
LDL CALC: 110 mg/dL — AB (ref 0–99)
TRIGLYCERIDES: 63 mg/dL (ref 0–149)
VLDL CHOLESTEROL CAL: 13 mg/dL (ref 5–40)

## 2018-08-18 ENCOUNTER — Encounter: Payer: Self-pay | Admitting: Cardiovascular Disease

## 2018-08-18 ENCOUNTER — Ambulatory Visit: Payer: Medicare HMO | Admitting: Cardiovascular Disease

## 2018-08-18 VITALS — BP 112/62 | HR 59 | Ht 65.0 in | Wt 160.8 lb

## 2018-08-18 DIAGNOSIS — Z79899 Other long term (current) drug therapy: Secondary | ICD-10-CM

## 2018-08-18 DIAGNOSIS — E785 Hyperlipidemia, unspecified: Secondary | ICD-10-CM | POA: Diagnosis not present

## 2018-08-18 DIAGNOSIS — I10 Essential (primary) hypertension: Secondary | ICD-10-CM

## 2018-08-18 DIAGNOSIS — G4733 Obstructive sleep apnea (adult) (pediatric): Secondary | ICD-10-CM | POA: Diagnosis not present

## 2018-08-18 DIAGNOSIS — R413 Other amnesia: Secondary | ICD-10-CM

## 2018-08-18 MED ORDER — ROSUVASTATIN CALCIUM 5 MG PO TABS: 5.0000 mg | ORAL_TABLET | Freq: Every day | ORAL | 3 refills | Status: DC

## 2018-08-18 MED ORDER — LISINOPRIL 5 MG PO TABS: 5.0000 mg | ORAL_TABLET | Freq: Every day | ORAL | 3 refills | Status: DC

## 2018-08-18 MED ORDER — OMEGA-3-ACID ETHYL ESTERS 1 G PO CAPS: 1.0000 g | ORAL_CAPSULE | Freq: Every day | ORAL | 1 refills | Status: DC

## 2018-08-18 MED ORDER — EZETIMIBE 10 MG PO TABS: 10.0000 mg | ORAL_TABLET | Freq: Every day | ORAL | 3 refills | Status: DC

## 2018-08-18 NOTE — Progress Notes (Signed)
Patient ID: Carlos Pierce, male   DOB: 01/15/1941, 78 y.o.   MRN: [id number]     HPI: Carlos Pierce is a 78 y.o. male who presents to the office today for an 74 month cardiology evaluation.  Carlos Pierce  has a history of mild hypertension and hyperlipidemia. Remotely, he has had mild elevation of PSAs which have been followed by Drs Carlos Pierce and more recently Dr. Mar Pierce.  He has had mild transaminase elevation in the past on Crestor.  In 2013 laboratory revealed  a total cholesterol 213 triglycerides 136 HDL 46 LDL 140. PSA was 3.19. Normal LFTs.  He has tolerated  Livalo 2 mg in addition to Zetia 10 mg. Repeat blood work on 01/12/2013 showed a total cholesterol 144 HDL 55 LDL 70 VLDL 19 triglycerides 96. He is tolerating the medicine well without side effects. Liver function studies have remained normal; PSA  decreased at 2.61. He remains active and exercises daily. Over the past 6 years he has lost 15-20 pounds as a result of his activity and improve diet.  He has continued to exercise at least  5 days per week.  He exercises on a stationary bike for over an hour for approximately 18 miles 3 days per week and on alternating days does low resistance training.  He had undergone a colonoscopy by Dr. [name].He denies chest pain PND or orthopnea. He denies palpitations. He denies myalgias.  He had experienced one episode where he said up very abruptly and did have transient dizziness where he stumbled.  He has not had recurrence of this.  He denied associated palpitations.  He denied vertigo.  In December 2016 lipid studies were excellent with a total cholesterol 141, triglycerides 96, HDL 47 and LDL 75.  PSA was 3.54.   In 06/29/2016  CBC was normal.  PSA was 2.7.  BUN27, creatinine 1.17.  LFTs were normal.  Lipid studies continue to be excellent with a total cholesterol of 146, triglycerides 1:30, HDL 51, and LDL 69.   Since I saw him in January 2018, he has had memory issues.   He also had some development of lower back discomfort.  He held off exercise for approximately 2 months but is now back exercising again.  He denies chest pain or shortness of breath.  He has now required hearing aids.  He underwent a sleep evaluation by the neurologist and was found to have mild sleep apnea.  Repeat blood pressure was 118/64 he uses CPAP for obstructive sleep apnea and admits to 100% compliance.  Laboratory in January 2019 showed a total cholesterol 173, HDL 56, LDL 94 and triglycerides 130.  He was Zetia and pravastatin 40 mg.  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  He continues to be on lisinopril 5 mg for hypertension.  GERD is controlled with Zantac.  Due to concerns for memory issues and statins he ultimately was advised to discontinue pravastatin by neurology.  Since I last saw him in April 2009, he has been followed by Carlos Pierce and is now on as a pill 10 mg at bedtime in addition to memantine 10 mg twice a day.  His wife was wondering about potential additional evaluation elsewhere.  According to the patient not had any major improvement in memory but perhaps there is not been as rapid deterioration as there had in the past.  At times his wife has noticed that it is difficult for him to get words out.  The patient denies any chest tightness or pressure.  He denies palpitations.  He denies presyncope or syncope.  He had undergone recent laboratory on August 12, 2018 which showed mild glucose elevation at 106.  Renal function was improved with a creatinine of 1.26.  Fair to 5 months ago lipid studies are significantly improved with total cholesterol improving from 2 61-1 75, triglycerides 1 55-63, and LDL cholesterol from 1 80-1 10.  He continues to take Zetia.  He also has been taking his son's lovaza generic omega 3 fatty acid.  In retrospect, he does not believe the pravastatin contributed to any of his memory issues.  He presents for  reevaluation.   Current Outpatient Medications  Medication Sig Dispense Refill  . Bran 500 MG TABS Take 1 tablet by mouth 2 (two) times daily.    . Coenzyme Q10 (CO Q 10 PO) Take 300 mg by mouth daily.    Marland Kitchen donepezil (ARICEPT) 10 MG tablet Take 1 tablet (10 mg total) by mouth at bedtime. 90 tablet 4  . ezetimibe (ZETIA) 10 MG tablet Take 1 tablet (10 mg total) by mouth daily. 90 tablet 3  . famotidine (PEPCID) 20 MG tablet Take 20 mg by mouth daily.     . Ginkgo Biloba 40 MG TABS Take 240 mg by mouth daily.    Marland Kitchen L-Tyrosine 500 MG CAPS Take 1 capsule by mouth daily.    Marland Kitchen lisinopril (PRINIVIL,ZESTRIL) 5 MG tablet Take 1 tablet (5 mg total) by mouth daily. 90 tablet 3  . memantine (NAMENDA) 10 MG tablet Take 1 tablet (10 mg total) by mouth 2 (two) times daily. 180 tablet 4  . Milk Thistle 1000 MG CAPS Take 1 capsule by mouth daily.    . Multiple Vitamins-Minerals (MULTIVITAMIN & MINERAL PO) Take 1 tablet by mouth daily.    . Omega-3 Fatty Acids (OMEGA 3 PO) Take 1,000 mg by mouth daily.    Marland Kitchen omega-3 acid ethyl esters (LOVAZA) 1 g capsule Take 1 capsule (1 g total) by mouth daily. 90 capsule 1  . rosuvastatin (CRESTOR) 5 MG tablet Take 1 tablet (5 mg total) by mouth daily. 90 tablet 3   No current facility-administered medications for this visit.     Social History   Socioeconomic History  . Marital status: Married    Spouse name: Carlos Pierce  . Number of children: 1  . Years of education: 45  . Highest education level: Not on file  Occupational History    Comment: [employer] [place]  . Financial resource strain: Not on file  . Food insecurity:    Worry: Not on file    Inability: Not on file  . Transportation needs:    Medical: Not on file    Non-medical: Not on file  Tobacco Use  . Smoking status: Never Smoker  . Smokeless tobacco: Never Used  Substance and Sexual Activity  . Alcohol use: Yes    Alcohol/week: 2.0 standard drinks    Types: 2 Standard drinks or  equivalent per week    Comment: wine a few days a week  . Drug use: No  . Sexual activity: Not on file  Lifestyle  . Physical activity:    Days per week: Not on file    Minutes per session: Not on file  . Stress: Not on file  Relationships  . Social connections:    Talks on phone: Not on file    Gets together: Not on file  Attends religious service: Not on file    Active member of club or organization: Not on file    Attends meetings of clubs or organizations: Not on file    Relationship status: Not on file  . Intimate partner violence:    Fear of current or ex partner: Not on file    Emotionally abused: Not on file    Physically abused: Not on file    Forced sexual activity: Not on file  Other Topics Concern  . Not on file  Social History Narrative   Drinks 1 cup of coffee a day     Additional social history is notable in that he is married has one child who is also my patient who has a history of hyperlipidemia.  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  ROS General: Negative; No fevers, chills, or night sweats;  HEENT:  now using a hearing aid, no changes in vision, sinus congestion, difficulty swallowing Pulmonary: Negative; No cough, wheezing, shortness of breath, hemoptysis Cardiovascular: Negative; No chest pain, presyncope, syncope, palpitations GI: Negative; No nausea, vomiting, diarrhea, or abdominal pain GU: Negative; No dysuria, hematuria, or difficulty voiding Musculoskeletal: Negative; no myalgias, joint pain, or weakness Hematologic/Oncology: Negative; no easy bruising, bleeding Endocrine: Negative; no heat/cold intolerance; no diabetes Neuro: Some short-term memory loss for which she has been evaluated by neurology Skin: Negative; No rashes or skin lesions Psychiatric: Negative; No behavioral problems, depression Sleep:  on CPAP for OSA per neurology; No snoring, daytime sleepiness, hypersomnolence, bruxism, restless legs, hypnogognic  hallucinations, no cataplexy Other comprehensive 14 point system review is negative.   PE BP 112/62   Pulse (!) 59   Ht _0  (1.651 m)   Wt 160 lb 12.8 oz (72.9 kg)   BMI 26.76 kg/m    Repeat blood pressure by me 110/64  Wt Readings from Last 3 Encounters:  08/18/18 160 lb 12.8 oz (72.9 kg)  11/26/17 156 lb 12.8 oz (71.1 kg)  09/13/17 154 lb (69.9 kg)   General: Alert, oriented, no distress.  Skin: normal turgor, no rashes, warm and dry HEENT: Normocephalic, atraumatic. Pupils equal round and reactive to light; sclera anicteric; extraocular muscles intact;  Nose without nasal septal hypertrophy Mouth/Parynx benign; Mallinpatti scale 3 Neck: No JVD, no carotid bruits; normal carotid upstroke Lungs: clear to ausculatation and percussion; no wheezing or rales Chest wall: without tenderness to palpitation Heart: PMI not displaced, RRR, s1 s2 normal, 1/6 systolic murmur, no diastolic murmur, no rubs, gallops, thrills, or heaves Abdomen: soft, nontender; no hepatosplenomehaly, BS+; abdominal aorta nontender and not dilated by palpation. Back: no CVA tenderness Pulses 2+ Musculoskeletal: full range of motion, normal strength, no joint deformities Extremities: no clubbing cyanosis or edema, Homan's sign negative  Neurologic: grossly nonfocal; Cranial nerves grossly wnl Psychologic: Normal mood and affect   ECG (independently read by me): Sinus bradycardia 59 bpm.  No ectopy.  Normal intervals.  April 2019 ECG (independently read by me): Normal sinus rhythm at 64 bpm.  PR interval 156 bili seconds.  QTc interval 456 ms.  January 2018 ECG (independently read by me):  Normal sinus rhythm at 67 bpm.  No ectopy.  Q wave in lead 3 and aVF with preserved R waves.  January 2016ECG: (Independently read by me): Normal sinus rhythm at 72 bpm.  No ectopy.  Normal intervals.  January 2015 ECG: (Independently read by me): Normal sinus rhythm at 71 beats per minute. No ST changes. Normal  intervals.   LABS:  BMP Latest Ref Rng & Units 08/12/2018 03/03/2018 09/24/2017  Glucose 65 - 99 mg/dL 106(H) 102(H) 103(H)  BUN 8 - 27 mg/dL _0 Creatinine 0.76 - 1.27 mg/dL 1.26 1.32(H) 1.11  BUN/Creat Ratio 10 - _1 Sodium 134 - 144 mmol/L 141 141 142  Potassium 3.5 - 5.2 mmol/L 4.8 4.8 4.7  Chloride 96 - 106 mmol/L 106 106 106  CO2 20 - 29 mmol/L _2 Calcium 8.6 - 10.2 mg/dL 9.7 9.7 9.7   Hepatic Function Latest Ref Rng & Units 08/12/2018 03/03/2018 09/24/2017  Total Protein 6.0 - 8.5 g/dL 6.5 6.6 6.7  Albumin 3.7 - 4.7 g/dL 4.3 4.3 4.6  AST 0 - 40 IU/L _3 ALT 0 - 44 IU/L _4 Alk Phosphatase 39 - 117 IU/L 48 51 44  Total Bilirubin 0.0 - 1.2 mg/dL 0.4 0.6 0.3  Bilirubin, Direct 0.0 - 0.2 mg/dL - - -   CBC Latest Ref Rng & Units 06/29/2016 06/03/2015 06/07/2014  WBC 3.8 - 10.8 K/uL 5.9 5.6 5.7  Hemoglobin 13.2 - 17.1 g/dL 13.6 13.4 13.6  Hematocrit 38.5 - 50.0 % 40.5 39.7 42.0  Platelets 140 - 400 K/uL 167 146(L) 144(L)   Lab Results  Component Value Date   MCV 90.2 06/29/2016   MCV 89.4 06/03/2015   MCV 90.9 06/07/2014    No results found for: HGBA1C  Lab Results  Component Value Date   TSH 2.790 07/23/2016   Lipid Panel     Component Value Date/Time   CHOL 175 08/12/2018 0910   TRIG 63 08/12/2018 0910   HDL 52 08/12/2018 0910   CHOLHDL 3.4 08/12/2018 0910   CHOLHDL 3.1 07/08/2017 0803   VLDL 26 06/29/2016 1021   LDLCALC 110 (H) 08/12/2018 0910   LDLCALC 94 07/08/2017 0803     RADIOLOGY: No results found.  IMPRESSION:  1. Essential hypertension   2. Hyperlipidemia, unspecified hyperlipidemia type   3. Medication management   4. OSA (obstructive sleep apnea)   5. Short-term memory loss     ASSESSMENT AND PLAN: Mr. Matos is a 78 year old gentleman who has a history of hypertension and hyperlipidemia. He has lost approximately 20 pounds over the last 10 years.  He has remained relatively active and denies chest pain or  shortness of breath.  In the past he was on Zetia and Livalo and due to insurance issues the Livalo was ultimately changed to pravastatin.  He has developed some dementia and is now on Aricept and Namenda which is followed by neurology.  He was advised by them to discontinue the pravastatin and when I had last seen him he had been off therapy for 2 months and his LDL cholesterol had risen to 180.  He is now back on Zetia alone and LDL cholesterol is now 110.  He also started to take his sons omega-3 fatty acid Lovaza capsule daily and his triglycerides have significantly improved and are now 63.  Since he now believes the statin did notafffect his memory he agrees to undergo a trial of initiating very low-dose Crestor at 5 mg.  His weight is stable with a BMI of 26.7.  Glucose was minimally increased at 106 on his laboratory.  His ECG remained stable.  Blood pressure today is excellent and he continues to be on low-dose lisinopril.  He is disappointed that he states he will now only be seeing a PA at the neurologist office since  he is on maximal Aricept and Namenda.  They asked me for potential options for reevaluation.  I did suggest perhaps he contact [facility] for an alternative evaluation.  His OSA is followed by neurology and he has CPAP.  His sleep study revealed mild obstructive sleep apnea with an AHI of 7.2/h, REM AHI of 12.7/h although he had significant oxygen desaturation to 78%.  I discussed with him that this can be monitored with a download that be able to assess certain he is on optimal treatment with reference to CPAP pressure I will see him in 6 months for follow-up evaluation and prior to that evaluation repeat laboratory will be obtained.  Time spent: 25 minutes Troy Sine, MD, [facility]  08/19/2018 5:04 PM

## 2018-08-18 NOTE — Patient Instructions (Addendum)
Medication Instructions:  Start Lovaza 1 gram daily. Start Rosuvastatin 5 mg daily.  If you need a refill on your cardiac medications before your next appointment, please call your pharmacy.   Labs: Fasting labs in 6 months before follow up appointment.  Follow-Up: At [facility], you and your health needs are our priority.  As part of our continuing mission to provide you with exceptional heart care, we have created designated Provider Care Teams.  These Care Teams include your primary Cardiologist (physician) and Advanced Practice Providers (APPs -  Physician Assistants and Nurse Practitioners) who all work together to provide you with the care you need, when you need it. You will need a follow up appointment in 6 months.  Please call our office 2 months in advance to schedule this appointment.  You may see Dr.Kelly or one of the following Advanced Practice Providers on your designated Care Team: Almyra Deforest, [place] . Fabian Sharp, PA-C

## 2018-08-19 ENCOUNTER — Encounter: Payer: Self-pay | Admitting: Cardiovascular Disease

## 2018-11-04 ENCOUNTER — Telehealth: Payer: Self-pay

## 2018-11-04 NOTE — Telephone Encounter (Signed)
Unable to get in contact with the patient to convert their office visit with Amy on 11/11/2018 into a doxy.me visit. I left a voicemail asking the patient to return my call. Office number was provided.   If patient calls back please convert their office visit into a doxy.me visit.

## 2018-11-10 NOTE — Telephone Encounter (Signed)
Spoke with the patient and they have given verbal consent to file their insurance and to do a doxy.me visit. Mobile number and carrier have been confirmed and sent.   Text: [phone] Youth worker)

## 2018-11-10 NOTE — Telephone Encounter (Signed)
Called the patient. Someone picked up but no one responded when I said "Hello" several times. I will attempt to call back later today.

## 2018-11-11 ENCOUNTER — Encounter: Payer: Self-pay | Admitting: Family Medicine

## 2018-11-11 ENCOUNTER — Other Ambulatory Visit: Payer: Self-pay

## 2018-11-11 ENCOUNTER — Ambulatory Visit (INDEPENDENT_AMBULATORY_CARE_PROVIDER_SITE_OTHER): Payer: Medicare HMO | Admitting: Family Medicine

## 2018-11-11 DIAGNOSIS — R413 Other amnesia: Secondary | ICD-10-CM | POA: Diagnosis not present

## 2018-11-11 DIAGNOSIS — G4733 Obstructive sleep apnea (adult) (pediatric): Secondary | ICD-10-CM

## 2018-11-11 DIAGNOSIS — Z9989 Dependence on other enabling machines and devices: Secondary | ICD-10-CM | POA: Diagnosis not present

## 2018-11-11 MED ORDER — DONEPEZIL HCL 10 MG PO TABS: 10.0000 mg | ORAL_TABLET | Freq: Every day | ORAL | 4 refills | Status: DC

## 2018-11-11 MED ORDER — MEMANTINE HCL 10 MG PO TABS: 10.0000 mg | ORAL_TABLET | Freq: Two times a day (BID) | ORAL | 4 refills | Status: DC

## 2018-11-11 NOTE — Progress Notes (Signed)
PATIENT: Carlos Pierce DOB: 05-29-1941  REASON FOR VISIT: follow up HISTORY FROM: patient  Virtual Visit via Telephone Note  I connected with Carlos Pierce on 11/11/18 at  9:30 AM EDT by telephone and verified that I am speaking with the correct person using two identifiers.   I discussed the limitations, risks, security and privacy concerns of performing an evaluation and management service by telephone and the availability of in person appointments. I also discussed with the patient that there may be a patient responsible charge related to this service. The patient expressed understanding and agreed to proceed.   History of Present Illness:  11/11/18 Carlos Pierce is a 78 y.o. male for follow up of dementia. He is taking donepezil 10mg  daily as well as memantine 10mg  twice daily.  He reports that he is doing very well on the medication with no obvious adverse effects.  His wife is present with him today and aids in history.  She states that she feels that overall he is doing fairly well.  He does continue to have trouble with word recall.  There was concern of depression in the past but she does not feel that that is a current issue.  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  He is able to perform all ADLs and still drives without difficulty.  Carlos Pierce, his wife, mentions during the visit that he is a CPAP user and has not had recent follow-up.  I have requested a CPAP download and report is as follows:  10/11/2018 - 11/09/2018 20 - 11/09/2018 Usage days 22/30 days (73%) >= 4 hours 20 days (67%) < 4 hours 2 days (7%) Usage hours 149 hours 19 minutes Average usage (total days) 4 hours 59 minutes Average usage (days used) 6 hours 47 minutes Median usage (days used) 7 hours 18 minutes Total used hours (value since last reset - 11/09/2018) 5,007 hours AirSense 10 AutoSet Serial number [id number] Mode CPAP Set pressure 7  cmH2O EPR Fulltime EPR level 3 Therapy Leaks - L/min Median: 1.1 95th percentile: 13.4 Maximum: 22.5 Events per hour AI: 22.8 HI: 0.1 AHI: 22.9 Apnea Index Central: 0.1 Obstructive: 22.4 Unknown: 0.2 RERA Index 0.4 Cheyne-Stokes respiration (average duration per night) 0 minutes (0%)   HISTORY (copied from Dr Gladstone Lighter note on 11/26/2017)   UPDATE (11/26/17, VRP): Since last visit, doing well. Tolerating memantine. No alleviating or aggravating factors. Feels like memory is slightly better. ADLs stable.   UPDATE (05/23/17, VRP): Since last visit, doing well per patient. Wife thinks there has been mild decline over last year, esp slower with conversations and losing train of thought and driving directions. No alleviating or aggravating factors. Eating well. No major change in ADLs. He stopped BP meds and chol meds to see if memory loss would improve, but he is not sure if there has been any change.   UPDATE 10/29/16: Since last visit, doing well. Memory loss stable. Now trying to stay calm when he forgets, and then the name or thought returns. Also with mild OSA, planning to start CPAP next month.   PRIOR HPI (07/23/16): 78 year old right-handed male here for evaluation of memory loss. Patient reports 1-2 months of mild short-term memory loss, losing track of his train of thought, word finding difficulties, difficulty with name recall, forgetting how to stay on task. 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 Otherwise he is able to maintain all of his activities of daily  living. He takes care of paying the bills and finances at home. He does light chores around the house. Patient's wife reports at least one year of duration of the symptoms. She also notes that he is having more trouble with driving directions, recall of people's names, telling a story and then getting off track at the end. Patient is otherwise very active physically. He exercises 4-5 times per week.  Observations/Objective:   Generalized: Well developed, in no acute distress  Mentation: Alert oriented to time, place, history taking. Follows all commands speech and language fluent   Assessment and Plan:  78 y.o. year old male  has a past medical history of Memory loss. here with    ICD-10-CM   1. Memory loss R41.3 donepezil (ARICEPT) 10 MG tablet    memantine (NAMENDA) 10 MG tablet  2. Obstructive sleep apnea treated with continuous positive airway pressure (CPAP) G47.33 For home use only DME continuous positive airway pressure (CPAP)   Z99.89    Mr Polio continues to do well on donepezil 10 mg daily at bedtime as well as memantine 10 mg twice daily.  He is tolerating rating medications with no obvious adverse effects.  We will continue this for memory concerns.  I have encouraged memory strategies and discussed these in detail with him and his wife.  Concerned regarding elevated AHI on CPAP report.  We will increase set pressure from 7 cm of water to 8 cm of water.  I have encouraged him to continue using CPAP every night and for greater than 4 hours each night.  I have schedule a 6-week follow-up to review download report at that time.  We may need to increase pressure at that time based on response.  He and his wife verbalized understanding and agreement with this plan.  Refills for medications and order sent to DME company.     Orders Placed This Encounter  Procedures  . For home use only DME continuous positive airway pressure (CPAP)    Please increase set pressure to 8cmH2O    Order Specific Question:   Length of Need    Answer:   Lifetime    Order Specific Question:   Patient has OSA or probable OSA    Answer:   Yes    Order Specific Question:   Is the patient currently using CPAP in the home    Answer:   Yes    Order Specific Question:   Settings    Answer:   Other see comments    Order Specific Question:   CPAP supplies needed    Answer:   Mask, headgear, cushions, filters, heated tubing and  water chamber    Meds ordered this encounter  Medications  . donepezil (ARICEPT) 10 MG tablet    Sig: Take 1 tablet (10 mg total) by mouth at bedtime.    Dispense:  90 tablet    Refill:  4    Order Specific Question:   Supervising Provider    Answer:   Melvenia Beam [id number]  . memantine (NAMENDA) 10 MG tablet    Sig: Take 1 tablet (10 mg total) by mouth 2 (two) times daily.    Dispense:  180 tablet    Refill:  4    Order Specific Question:   Supervising Provider    Answer:   Melvenia Beam [id number]     Follow Up Instructions:  I discussed the assessment and treatment plan with the patient. The patient was provided an  opportunity to ask questions and all were answered. The patient agreed with the plan and demonstrated an understanding of the instructions.   The patient was advised to call back or seek an in-person evaluation if the symptoms worsen or if the condition fails to improve as anticipated.  I provided 25 minutes of non-face-to-face time during this encounter.  Patient and his wife are located at their place of residence during video visit.  Provider is located at her place of residence.  Maryelizabeth Kaufmann, CMA helped to facilitate visit.   Debbora Presto, NP

## 2018-11-11 NOTE — Progress Notes (Signed)
I reviewed note and agree with plan.   Penni Bombard, MD 0/12/6806, 81:10 PM Certified in Neurology, Neurophysiology and Neuroimaging  [facility] [address], [place], [place] [address] [phone]

## 2018-11-28 ENCOUNTER — Ambulatory Visit: Payer: Medicare Other | Admitting: Nurse Practitioner

## 2018-11-28 ENCOUNTER — Ambulatory Visit: Payer: Medicare Other | Admitting: Family Medicine

## 2018-12-08 DIAGNOSIS — H906 Mixed conductive and sensorineural hearing loss, bilateral: Secondary | ICD-10-CM | POA: Diagnosis not present

## 2018-12-16 ENCOUNTER — Ambulatory Visit: Payer: Medicare Other | Admitting: Family Medicine

## 2018-12-19 NOTE — Progress Notes (Addendum)
PATIENT: Carlos Pierce DOB: 1940/07/08  REASON FOR VISIT: follow up HISTORY FROM: patient  Chief Complaint  Patient presents with  . Follow-up    CPAP f/u. Alone. Rm 2. No concerns at this time.      HISTORY OF PRESENT ILLNESS: Today 12/30/18 Carlos Pierce is a 78 y.o. male here today for follow up os OSA on CPAP.  He continues to report improvement in overall sleep quality as well as fatigue levels during the day.  He is also noted mild improvement of memory loss.  He is using CPAP nightly.  Compliance data report dated 11/25/2018 through 12/24/2018 when he is using his CPAP machine 30 out of the last 30 days.  29 of those days he used his machine greater than 4 hours.  Average usage was about 6 hours and 52 minutes.  AHI remains elevated at 26.  CPAP is at set pressure of 8 cm of water and EPR of 3.  There was no significant leak.  He denies any trouble with equipment.  He feels that his mask is well fitting.  He has no concerns today.   HISTORY: (copied from my note on 11/11/2018)  Carlos Pierce is a 78 y.o. male for follow up of dementia. He is taking donepezil 10mg  daily as well as memantine 10mg  twice daily.  He reports that he is doing very well on the medication with no obvious adverse effects.  His wife is present with him today and aids in history.  She states that she feels that overall he is doing fairly well.  He does continue to have trouble with word recall.  There was concern of depression in the past but she does not feel that that is a current issue.  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  He is able to perform all ADLs and still drives without difficulty.  Carlos Pierce, his wife, mentions during the visit that he is a CPAP user and has not had recent follow-up.  I have requested a CPAP download and report is as follows:  10/11/2018 - 11/09/2018 20 - 11/09/2018 Usage days 22/30 days (73%) >= 4 hours 20 days  (67%) < 4 hours 2 days (7%) Usage hours 149 hours 19 minutes Average usage (total days) 4 hours 59 minutes Average usage (days used) 6 hours 47 minutes Median usage (days used) 7 hours 18 minutes Total used hours (value since last reset - 11/09/2018) 5,007 hours AirSense 10 AutoSet Serial number [id number] Mode CPAP Set pressure 7 cmH2O EPR Fulltime EPR level 3 Therapy Leaks - L/min Median: 1.1 95th percentile: 13.4 Maximum: 22.5 Events per hour AI: 22.8 HI: 0.1 AHI: 22.9 Apnea Index Central: 0.1 Obstructive: 22.4 Unknown: 0.2 RERA Index 0.4 Cheyne-Stokes respiration (average duration per night) 0 minutes (0%)   HISTORY (copied from Dr Gladstone Lighter note on 11/26/2017)  UPDATE (11/26/17, VRP): Since last visit, doingwell. Toleratingmemantine. No alleviating or aggravating factors.Feels like memory is slightly better. ADLs stable.  UPDATE (05/23/17, VRP): Since last visit, doing well per patient. Wife thinks there has been mild decline over last year, esp slower with conversations and losing train of thought and driving directions. No alleviating or aggravating factors. Eating well. No major change in ADLs. He stopped BP meds and chol meds to see if memory loss would improve, but he is not sure if there has been any change.   UPDATE 10/29/16: Since last visit, doing well. Memory loss stable.  Now trying to stay calm when he forgets, and then the name or thought returns. Also with mild OSA, planning to start CPAP next month.   PRIOR HPI (07/23/16): 78 year old right-handed male here for evaluation of memory loss. Patient reports 1-2 months of mild short-term memory loss, losing track of his train of thought, word finding difficulties, difficulty with name recall, forgetting how to stay on task. 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 Otherwise he is able to maintain all of his activities of daily living. He takes care of paying the bills and finances at home. He does light chores  around the house. Patient's wife reports at least one year of duration of the symptoms. She also notes that he is having more trouble with driving directions, recall of people's names, telling a story and then getting off track at the end. Patient is otherwise very active physically. He exercises 4-5 times per week.  REVIEW OF SYSTEMS: Out of a complete 14 system review of symptoms, the patient complains only of the following symptoms, none and all other reviewed systems are negative.  Epworth sleepiness scale: 0 Fatigue severity scale: 9  ALLERGIES: No Known Allergies  HOME MEDICATIONS: Outpatient Medications Prior to Visit  Medication Sig Dispense Refill  . Bran 500 MG TABS Take 1 tablet by mouth 2 (two) times daily.    . Coenzyme Q10 (CO Q 10 PO) Take 300 mg by mouth daily.    Marland Kitchen donepezil (ARICEPT) 10 MG tablet Take 1 tablet (10 mg total) by mouth at bedtime. 90 tablet 4  . ezetimibe (ZETIA) 10 MG tablet Take 1 tablet (10 mg total) by mouth daily. 90 tablet 3  . famotidine (PEPCID) 20 MG tablet Take 20 mg by mouth daily.     . Ginkgo Biloba 40 MG TABS Take 240 mg by mouth daily.    Marland Kitchen L-Tyrosine 500 MG CAPS Take 1 capsule by mouth daily.    Marland Kitchen lisinopril (PRINIVIL,ZESTRIL) 5 MG tablet Take 1 tablet (5 mg total) by mouth daily. 90 tablet 3  . memantine (NAMENDA) 10 MG tablet Take 1 tablet (10 mg total) by mouth 2 (two) times daily. 180 tablet 4  . Milk Thistle 1000 MG CAPS Take 1 capsule by mouth daily.    . Multiple Vitamins-Minerals (MULTIVITAMIN & MINERAL PO) Take 1 tablet by mouth daily.    Marland Kitchen omega-3 acid ethyl esters (LOVAZA) 1 g capsule Take 1 capsule (1 g total) by mouth daily. 90 capsule 1  . Omega-3 Fatty Acids (OMEGA 3 PO) Take 1,000 mg by mouth daily.    . rosuvastatin (CRESTOR) 5 MG tablet Take 1 tablet (5 mg total) by mouth daily. 90 tablet 3   No facility-administered medications prior to visit.     PAST MEDICAL HISTORY: Past Medical History:  Diagnosis Date  . Memory  loss     PAST SURGICAL HISTORY: Past Surgical History:  Procedure Laterality Date  . CATARACT EXTRACTION Bilateral   . HEMORRHOID BANDING  2016    FAMILY HISTORY: History reviewed. No pertinent family history.  SOCIAL HISTORY: Social History   Socioeconomic History  . Marital status: Married    Spouse name: Carlos Pierce  . Number of children: 1  . Years of education: 50  . Highest education level: Not on file  Occupational History    Comment: [employer]  . Financial resource strain: Not on file  . Food insecurity    Worry: Not on file  Inability: Not on file  . Transportation needs    Medical: Not on file    Non-medical: Not on file  Tobacco Use  . Smoking status: Never Smoker  . Smokeless tobacco: Never Used  Substance and Sexual Activity  . Alcohol use: Yes    Alcohol/week: 2.0 standard drinks    Types: 2 Standard drinks or equivalent per week    Comment: wine a few days a week  . Drug use: No  . Sexual activity: Not on file  Lifestyle  . Physical activity    Days per week: Not on file    Minutes per session: Not on file  . Stress: Not on file  Relationships  . Social Herbalist on phone: Not on file    Gets together: Not on file    Attends religious service: Not on file    Active member of club or organization: Not on file    Attends meetings of clubs or organizations: Not on file    Relationship status: Not on file  . Intimate partner violence    Fear of current or ex partner: Not on file    Emotionally abused: Not on file    Physically abused: Not on file    Forced sexual activity: Not on file  Other Topics Concern  . Not on file  Social History Narrative   Drinks 1 cup of coffee a day       PHYSICAL EXAM  Vitals:   12/29/18 0905  BP: 109/63  Pulse: (!) 59  Temp: 98.2 F (36.8 C)  TempSrc: Oral  Weight: 151 lb 9.6 oz (68.8 kg)  Height: 5\' 5"  (1.651 m)   Body mass index is 25.23 kg/m.  Generalized:  Well developed, in no acute distress  Neck circumference 16", Mallampati 3+ Neurological examination  Mentation: Alert oriented to time, place, history taking. Follows all commands speech and language fluent Cranial nerve II-XII: Pupils were equal round reactive to light. Extraocular movements were full, visual field were full on confrontational test. Facial sensation and strength were normal. Uvula tongue midline. Head turning and shoulder shrug  were normal and symmetric. Motor: The motor testing reveals 5 over 5 strength of all 4 extremities. Good symmetric motor tone is noted throughout.   Coordination: Cerebellar testing reveals good finger-nose-finger and heel-to-shin bilaterally.  Gait and station: Gait is normal.   DIAGNOSTIC DATA (LABS, IMAGING, TESTING) - I reviewed patient records, labs, notes, testing and imaging myself where available.  MMSE - Mini Mental State Exam 11/26/2017 05/23/2017 10/29/2016  Orientation to time 3 3 5   Orientation to Place 3 4 5   Registration 3 3 3   Attention/ Calculation 5 4 5   Recall 0 1 3  Language- name 2 objects 2 2 2   Language- repeat 1 1 1   Language- follow 3 step command 3 3 3   Language- read & follow direction 1 1 1   Write a sentence 1 1 1   Copy design 0 0 0  Total score 22 23 29      Lab Results  Component Value Date   WBC 5.9 06/29/2016   HGB 13.6 06/29/2016   HCT 40.5 06/29/2016   MCV 90.2 06/29/2016   PLT 167 06/29/2016      Component Value Date/Time   NA 141 08/12/2018 0910   K 4.8 08/12/2018 0910   CL 106 08/12/2018 0910   CO2 21 08/12/2018 0910   GLUCOSE 106 (H) 08/12/2018 0910   GLUCOSE 103 (H) 06/29/2016 1021  BUN 26 08/12/2018 0910   CREATININE 1.26 08/12/2018 0910   CREATININE 1.17 06/29/2016 1021   CALCIUM 9.7 08/12/2018 0910   PROT 6.5 08/12/2018 0910   ALBUMIN 4.3 08/12/2018 0910   AST 22 08/12/2018 0910   ALT 27 08/12/2018 0910   ALKPHOS 48 08/12/2018 0910   BILITOT 0.4 08/12/2018 0910   GFRNONAA 55 (L)  08/12/2018 0910   GFRAA 64 08/12/2018 0910   Lab Results  Component Value Date   CHOL 175 08/12/2018   HDL 52 08/12/2018   LDLCALC 110 (H) 08/12/2018   TRIG 63 08/12/2018   CHOLHDL 3.4 08/12/2018   No results found for: HGBA1C Lab Results  Component Value Date   VITAMINB12 815 07/23/2016   Lab Results  Component Value Date   TSH 2.790 07/23/2016    ASSESSMENT AND PLAN 78 y.o. year old male  has a past medical history of Memory loss. here with     ICD-10-CM   1. Obstructive sleep apnea treated with continuous positive airway pressure (CPAP)  G47.33 For home use only DME continuous positive airway pressure (CPAP)   Z99.89   2. Memory loss  R41.3 For home use only DME continuous positive airway pressure (CPAP)    Carlos Pierce continues to note benefit from using CPAP nightly.  Unfortunately, AHI remains elevated.  In consultation with Dr. [name], we have decided to change settings to auto titration with minimum pressure of 7, maximum pressure of 13 and EPR level of 3.  Orders will be sent to DME company.  We will repeat download in 1 month.  Carlos Pierce is aware that we will call to discuss these results.  Should AHI remain elevated at that time, we will consider titration study.  He was encouraged to continue using CPAP nightly and for greater than 4 hours each night.  He verbalizes understanding and agreement with this plan.   Orders Placed This Encounter  Procedures  . For home use only DME continuous positive airway pressure (CPAP)    Please set CPAP to auto tiration Min pressure 7cmH20, Max pressure 13cmH20 and EPR level 3.    Order Specific Question:   Length of Need    Answer:   Lifetime    Order Specific Question:   Patient has OSA or probable OSA    Answer:   Yes    Order Specific Question:   Is the patient currently using CPAP in the home    Answer:   Yes    Order Specific Question:   Settings    Answer:   Autotitration    Order Specific Question:   CPAP supplies needed     Answer:   Mask, headgear, cushions, filters, heated tubing and water chamber     No orders of the defined types were placed in this encounter.     I spent 15 minutes with the patient. 50% of this time was spent counseling and educating patient on plan of care and medications.    Debbora Presto, FNP-C 12/30/2018, 8:14 AM [facility] [address], [place], [place] [address] [phone]  I reviewed the above note and documentation by the Nurse Practitioner and agree with the history, exam, assessment and plan as outlined above. I was immediately available for consultation. Star Age, MD, PhD [facility])

## 2018-12-29 ENCOUNTER — Encounter: Payer: Self-pay | Admitting: Family Medicine

## 2018-12-29 ENCOUNTER — Ambulatory Visit (INDEPENDENT_AMBULATORY_CARE_PROVIDER_SITE_OTHER): Payer: Medicare HMO | Admitting: Family Medicine

## 2018-12-29 ENCOUNTER — Other Ambulatory Visit: Payer: Self-pay

## 2018-12-29 VITALS — BP 109/63 | HR 59 | Temp 98.2°F | Ht 65.0 in | Wt 151.6 lb

## 2018-12-29 DIAGNOSIS — Z9989 Dependence on other enabling machines and devices: Secondary | ICD-10-CM

## 2018-12-29 DIAGNOSIS — G4733 Obstructive sleep apnea (adult) (pediatric): Secondary | ICD-10-CM

## 2018-12-29 DIAGNOSIS — R413 Other amnesia: Secondary | ICD-10-CM | POA: Diagnosis not present

## 2018-12-29 NOTE — Patient Instructions (Signed)
We will have DME company set machine to auto titration  Repeat download in 1 month  We will follow up pending download in August   CPAP and BPAP Information CPAP and BPAP are methods of helping a person breathe with the use of air pressure. CPAP stands for "continuous positive airway pressure." BPAP stands for "bi-level positive airway pressure." In both methods, air is blown through your nose or mouth and into your air passages to help you breathe well. CPAP and BPAP use different amounts of pressure to blow air. With CPAP, the amount of pressure stays the same while you breathe in and out. With BPAP, the amount of pressure is increased when you breathe in (inhale) so that you can take larger breaths. Your health care provider will recommend whether CPAP or BPAP would be more helpful for you. Why are CPAP and BPAP treatments used? CPAP or BPAP can be helpful if you have:  Sleep apnea.  Chronic obstructive pulmonary disease (COPD).  Heart failure.  Medical conditions that weaken the muscles of the chest including muscular dystrophy, or neurological diseases such as amyotrophic lateral sclerosis (ALS).  Other problems that cause breathing to be weak, abnormal, or difficult. CPAP is most commonly used for obstructive sleep apnea (OSA) to keep the airways from collapsing when the muscles relax during sleep. How is CPAP or BPAP administered? Both CPAP and BPAP are provided by a small machine with a flexible plastic tube that attaches to a plastic mask. You wear the mask. Air is blown through the mask into your nose or mouth. The amount of pressure that is used to blow the air can be adjusted on the machine. Your health care provider will determine the pressure setting that should be used based on your individual needs. When should CPAP or BPAP be used? In most cases, the mask only needs to be worn during sleep. Generally, the mask needs to be worn throughout the night and during any daytime  naps. People with certain medical conditions may also need to wear the mask at other times when they are awake. Follow instructions from your health care provider about when to use the machine. What are some tips for using the mask?   Because the mask needs to be snug, some people feel trapped or closed-in (claustrophobic) when first using the mask. If you feel this way, you may need to get used to the mask. One way to do this is by holding the mask loosely over your nose or mouth and then gradually applying the mask more snugly. You can also gradually increase the amount of time that you use the mask.  Masks are available in various types and sizes. Some fit over your mouth and nose while others fit over just your nose. If your mask does not fit well, talk with your health care provider about getting a different one.  If you are using a mask that fits over your nose and you tend to breathe through your mouth, a chin strap may be applied to help keep your mouth closed.  The CPAP and BPAP machines have alarms that may sound if the mask comes off or develops a leak.  If you have trouble with the mask, it is very important that you talk with your health care provider about finding a way to make the mask easier to tolerate. Do not stop using the mask. Stopping the use of the mask could have a negative impact on your health. What are some  tips for using the machine?  Place your CPAP or BPAP machine on a secure table or stand near an electrical outlet.  Know where the on/off switch is located on the machine.  Follow instructions from your health care provider about how to set the pressure on your machine and when you should use it.  Do not eat or drink while the CPAP or BPAP machine is on. Food or fluids could get pushed into your lungs by the pressure of the CPAP or BPAP.  Do not smoke. Tobacco smoke residue can damage the machine.  For home use, CPAP and BPAP machines can be rented or purchased  through home health care companies. Many different brands of machines are available. Renting a machine before purchasing may help you find out which particular machine works well for you.  Keep the CPAP or BPAP machine and attachments clean. Ask your health care provider for specific instructions. Get help right away if:  You have redness or open areas around your nose or mouth where the mask fits.  You have trouble using the CPAP or BPAP machine.  You cannot tolerate wearing the CPAP or BPAP mask.  You have pain, discomfort, and bloating in your abdomen. Summary  CPAP and BPAP are methods of helping a person breathe with the use of air pressure.  Both CPAP and BPAP are provided by a small machine with a flexible plastic tube that attaches to a plastic mask.  If you have trouble with the mask, it is very important that you talk with your health care provider about finding a way to make the mask easier to tolerate. This information is not intended to replace advice given to you by your health care provider. Make sure you discuss any questions you have with your health care provider. Document Released: 02/24/2004 Document Revised: 09/17/2018 Document Reviewed: 04/16/2016 Elsevier Patient Education  2020 [employer].

## 2018-12-30 ENCOUNTER — Encounter: Payer: Self-pay | Admitting: Family Medicine

## 2019-02-18 ENCOUNTER — Telehealth: Payer: Self-pay | Admitting: Family Medicine

## 2019-02-18 DIAGNOSIS — G4733 Obstructive sleep apnea (adult) (pediatric): Secondary | ICD-10-CM

## 2019-02-18 NOTE — Telephone Encounter (Signed)
Please let Mr. Defauw know that I have reviewed his most recent compliance report.  His apneic events remain elevated.  He was reading shows 27.9 events per hour.  He is also noted to have continued leak.  I am uncertain if correcting leak will significantly lower apneic events.  I think that it is wise if he considers returning for CPAP titration study.  This will allow [place] to identify the correct pressures.  I will place orders today.

## 2019-02-18 NOTE — Telephone Encounter (Signed)
Sent mychart message

## 2019-02-19 NOTE — Addendum Note (Signed)
Addended by: Lester Ball Club A on: 02/19/2019 07:33 AM   Modules accepted: Orders

## 2019-03-10 LAB — CBC
Hematocrit: 38.7 % (ref 37.5–51.0)
Hemoglobin: 12.8 g/dL — ABNORMAL LOW (ref 13.0–17.7)
MCH: 30.4 pg (ref 26.6–33.0)
MCHC: 33.1 g/dL (ref 31.5–35.7)
MCV: 92 fL (ref 79–97)
Platelets: 168 10*3/uL (ref 150–450)
RBC: 4.21 x10E6/uL (ref 4.14–5.80)
RDW: 13.7 % (ref 11.6–15.4)
WBC: 6.7 10*3/uL (ref 3.4–10.8)

## 2019-03-10 LAB — COMPREHENSIVE METABOLIC PANEL
ALT: 31 IU/L (ref 0–44)
AST: 28 IU/L (ref 0–40)
Albumin/Globulin Ratio: 1.6 (ref 1.2–2.2)
Albumin: 4.1 g/dL (ref 3.7–4.7)
Alkaline Phosphatase: 56 IU/L (ref 39–117)
BUN/Creatinine Ratio: 18 (ref 10–24)
BUN: 20 mg/dL (ref 8–27)
Bilirubin Total: 0.5 mg/dL (ref 0.0–1.2)
CO2: 21 mmol/L (ref 20–29)
Calcium: 9.6 mg/dL (ref 8.6–10.2)
Chloride: 107 mmol/L — ABNORMAL HIGH (ref 96–106)
Creatinine, Ser: 1.11 mg/dL (ref 0.76–1.27)
GFR calc Af Amer: 74 mL/min/{1.73_m2} (ref >59)
GFR calc non Af Amer: 64 mL/min/{1.73_m2} (ref >59)
Globulin, Total: 2.5 g/dL (ref 1.5–4.5)
Glucose: 110 mg/dL — ABNORMAL HIGH (ref 65–99)
Potassium: 4.6 mmol/L (ref 3.5–5.2)
Sodium: 142 mmol/L (ref 134–144)
Total Protein: 6.6 g/dL (ref 6.0–8.5)

## 2019-03-10 LAB — LIPID PANEL
Chol/HDL Ratio: 3.4 ratio (ref 0.0–5.0)
Cholesterol, Total: 191 mg/dL (ref 100–199)
HDL: 56 mg/dL (ref >39)
LDL Chol Calc (NIH): 115 mg/dL — ABNORMAL HIGH (ref 0–99)
Triglycerides: 110 mg/dL (ref 0–149)
VLDL Cholesterol Cal: 20 mg/dL (ref 5–40)

## 2019-03-10 LAB — TSH: TSH: 3.6 u[IU]/mL (ref 0.450–4.500)

## 2019-03-12 ENCOUNTER — Encounter: Payer: Self-pay | Admitting: Cardiovascular Disease

## 2019-03-12 ENCOUNTER — Other Ambulatory Visit: Payer: Self-pay

## 2019-03-12 ENCOUNTER — Ambulatory Visit: Payer: Medicare HMO | Admitting: Cardiovascular Disease

## 2019-03-12 VITALS — BP 99/65 | HR 64 | Temp 97.2°F | Ht 65.0 in | Wt 147.0 lb

## 2019-03-12 DIAGNOSIS — G4733 Obstructive sleep apnea (adult) (pediatric): Secondary | ICD-10-CM

## 2019-03-12 DIAGNOSIS — E782 Mixed hyperlipidemia: Secondary | ICD-10-CM | POA: Diagnosis not present

## 2019-03-12 DIAGNOSIS — R413 Other amnesia: Secondary | ICD-10-CM | POA: Diagnosis not present

## 2019-03-12 DIAGNOSIS — Z8619 Personal history of other infectious and parasitic diseases: Secondary | ICD-10-CM

## 2019-03-12 DIAGNOSIS — Z8616 Personal history of COVID-19: Secondary | ICD-10-CM

## 2019-03-12 DIAGNOSIS — I10 Essential (primary) hypertension: Secondary | ICD-10-CM | POA: Diagnosis not present

## 2019-03-12 MED ORDER — LISINOPRIL 5 MG PO TABS: 2.5000 mg | ORAL_TABLET | Freq: Every day | ORAL | 0 refills | Status: DC

## 2019-03-12 MED ORDER — OMEGA-3-ACID ETHYL ESTERS 1 G PO CAPS: 1.0000 g | ORAL_CAPSULE | Freq: Every day | ORAL | 1 refills | Status: DC

## 2019-03-12 NOTE — Progress Notes (Signed)
Patient ID: Carlos Pierce, male   DOB: Aug 08, 1940, 78 y.o.   MRN: [id number]     HPI: Carlos Pierce is a 78 y.o. male who presents to the office today for a 7 month cardiology evaluation.  Carlos Pierce  has a history of mild hypertension and hyperlipidemia. Remotely, he has had mild elevation of PSAs which have been followed by Carlos Pierce and more recently Carlos Pierce.  He has had mild transaminase elevation in the past on Crestor.  In 2013 laboratory revealed  a total cholesterol 213 triglycerides 136 HDL 46 LDL 140. PSA was 3.19. Normal LFTs.  He has tolerated  Livalo 2 mg in addition to Zetia 10 mg. Repeat blood work on 01/12/2013 showed a total cholesterol 144 HDL 55 LDL 70 VLDL 19 triglycerides 96. He is tolerating the medicine well without side effects. Liver function studies have remained normal; PSA  decreased at 2.61. He remains active and exercises daily. Over the past 6 years he has lost 15-20 pounds as a result of his activity and improve diet.  He has continued to exercise at least  5 days per week.  He exercises on a stationary bike for over an hour for approximately 18 miles 3 days per week and on alternating days does low resistance training.  He had undergone a colonoscopy by Carlos Pierce.He denies chest pain PND or orthopnea. He denies palpitations. He denies myalgias.  He had experienced one episode where he said up very abruptly and did have transient dizziness where he stumbled.  He has not had recurrence of this.  He denied associated palpitations.  He denied vertigo.  In December 2016 lipid studies were excellent with a total cholesterol 141, triglycerides 96, HDL 47 and LDL 75.  PSA was 3.54.   In 06/29/2016  CBC was normal.  PSA was 2.7.  BUN27, creatinine 1.17.  LFTs were normal.  Lipid studies continue to be excellent with a total cholesterol of 146, triglycerides 1:30, HDL 51, and LDL 69.   Since I saw him in January 2018, he has had memory issues.   He also had some development of lower back discomfort.  He held off exercise for approximately 2 months but is now back exercising again.  He denies chest pain or shortness of breath.  He has now required hearing aids.  He underwent a sleep evaluation by the neurologist and was found to have mild sleep apnea.  Repeat blood pressure was 118/64 he uses CPAP for obstructive sleep apnea and admits to 100% compliance.  Laboratory in January 2019 showed a total cholesterol 173, HDL 56, LDL 94 and triglycerides 130.  He was Zetia and pravastatin 40 mg.  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  He continues to be on lisinopril 5 mg for hypertension.  GERD is controlled with Zantac.  Due to concerns for memory issues and statins he ultimately was advised to discontinue pravastatin by neurology.  Since I saw him in April 2019, he has been followed by Carlos Pierce and is now donepezil 10 mg at bedtime in addition to memantine 10 mg twice a day.  His wife was wondering about potential additional evaluation elsewhere.  According to the patient not had any major improvement in memory but perhaps there is not been as rapid deterioration as there had in the past.  At times his wife has noticed that it is difficult for him to get words out.  The patient denies  any chest tightness or pressure.  He denied palpitations, presyncope or syncope.    I last saw him on March 2020.  Laboratory on August 12, 2018  showed mild glucose elevation at 106.  Renal function was improved with a creatinine of 1.26.  Fair to 5 months ago lipid studies are significantly improved with total cholesterol improving from 261 to 175, triglycerides 155 to63, and LDL cholesterol from 180 to 110.  He continues to take Zetia.  He also has been taking his son's lovaza generic omega 3 fatty acid.  In retrospect, he does not believe the pravastatin contributed to any of his memory issues.    Since I last saw him, he  contracted COVID-19 from his son and had symptoms of fever for several days, cough, loss of taste and smell.  He had taken some of his wife's doxycycline days and he believes this may have ameliorated some of his symptoms.  However he did note significant residual fatigability.  He admits to fatigue.  He continues to have difficulty losing his words and recently saw his neurologist who scheduled him for repeat sleep study which is scheduled for October 13.  He denies chest pain.  He denies palpitations.  He is not taking rosuvastatin but is taking Zetia 10 mg as well as lisinopril 5 mg daily and lovasa 1 capsule daily.  He presents for reevaluation.   Current Outpatient Medications  Medication Sig Dispense Refill  . Bran 500 MG TABS Take 1 tablet by mouth 2 (two) times daily.    . Coenzyme Q10 (CO Q 10 PO) Take 300 mg by mouth daily.    Marland Kitchen donepezil (ARICEPT) 10 MG tablet Take 1 tablet (10 mg total) by mouth at bedtime. 90 tablet 4  . ezetimibe (ZETIA) 10 MG tablet Take 1 tablet (10 mg total) by mouth daily. 90 tablet 3  . famotidine (PEPCID) 20 MG tablet Take 20 mg by mouth daily.     . Ginkgo Biloba 40 MG TABS Take 240 mg by mouth daily.    Marland Kitchen L-Tyrosine 500 MG CAPS Take 1 capsule by mouth daily.    Marland Kitchen lisinopril (ZESTRIL) 5 MG tablet Take 0.5 tablets (2.5 mg total) by mouth daily. 90 tablet 0  . memantine (NAMENDA) 10 MG tablet Take 1 tablet (10 mg total) by mouth 2 (two) times daily. 180 tablet 4  . Milk Thistle 1000 MG CAPS Take 1 capsule by mouth daily.    . Multiple Vitamins-Minerals (MULTIVITAMIN & MINERAL PO) Take 1 tablet by mouth daily.    Marland Kitchen omega-3 acid ethyl esters (LOVAZA) 1 g capsule Take 1 capsule (1 g total) by mouth daily. 90 capsule 1  . Omega-3 Fatty Acids (OMEGA 3 PO) Take 1,000 mg by mouth daily.     No current facility-administered medications for this visit.     Social History   Socioeconomic History  . Marital status: Married    Spouse name: Carlos Pierce  . Number of  children: 1  . Years of education: 38  . Highest education level: Not on file  Occupational History    Comment: [employer] and [place]  . Financial resource strain: Not on file  . Food insecurity    Worry: Not on file    Inability: Not on file  . Transportation needs    Medical: Not on file    Non-medical: Not on file  Tobacco Use  . Smoking status: Never Smoker  . Smokeless tobacco: Never Used  Substance and Sexual Activity  . Alcohol use: Yes    Alcohol/week: 2.0 standard drinks    Types: 2 Standard drinks or equivalent per week    Comment: wine a few days a week  . Drug use: No  . Sexual activity: Not on file  Lifestyle  . Physical activity    Days per week: Not on file    Minutes per session: Not on file  . Stress: Not on file  Relationships  . Social Herbalist on phone: Not on file    Gets together: Not on file    Attends religious service: Not on file    Active member of club or organization: Not on file    Attends meetings of clubs or organizations: Not on file    Relationship status: Not on file  . Intimate partner violence    Fear of current or ex partner: Not on file    Emotionally abused: Not on file    Physically abused: Not on file    Forced sexual activity: Not on file  Other Topics Concern  . Not on file  Social History Narrative   Drinks 1 cup of coffee a day     Additional social history is notable in that he is married has one child who is also my patient who has a history of hyperlipidemia.  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  ROS General: Negative; No fevers, chills, or night sweats; fatigue HEENT:  now using a hearing aid, no changes in vision, sinus congestion, difficulty swallowing Pulmonary: Negative; No cough, wheezing, shortness of breath, hemoptysis Cardiovascular: Negative; No chest pain, presyncope, syncope, palpitations GI: Negative; No nausea, vomiting, diarrhea, or abdominal pain GU:  Negative; No dysuria, hematuria, or difficulty voiding Musculoskeletal: Negative; no myalgias, joint pain, or weakness Hematologic/Oncology: Negative; no easy bruising, bleeding Endocrine: Negative; no heat/cold intolerance; no diabetes Neuro: Some short-term memory loss for which she has been evaluated by neurology Skin: Negative; No rashes or skin lesions Psychiatric: Negative; No behavioral problems, depression Sleep:  on CPAP for OSA per neurology; No snoring, daytime sleepiness, hypersomnolence, bruxism, restless legs, hypnogognic hallucinations, no cataplexy Other comprehensive 14 point system review is negative.   PE BP 99/65   Pulse 64   Temp (!) 97.2 F (36.2 C)   Ht 5' 5"  (1.651 m)   Wt 147 lb (66.7 kg)   SpO2 99%   BMI 24.46 kg/m    Repeat blood pressure by me was 106/64 supine and 100/62 standing  Wt Readings from Last 3 Encounters:  03/12/19 147 lb (66.7 kg)  12/29/18 151 lb 9.6 oz (68.8 kg)  08/18/18 160 lb 12.8 oz (72.9 kg)   General: Alert, oriented, no distress.  Skin: normal turgor, no rashes, warm and dry HEENT: Normocephalic, atraumatic. Pupils equal round and reactive to light; sclera anicteric; extraocular muscles intact;  Nose without nasal septal hypertrophy Mouth/Parynx benign; Mallinpatti scale 3 Neck: No JVD, no carotid bruits; normal carotid upstroke Lungs: clear to ausculatation and percussion; no wheezing or rales Chest wall: without tenderness to palpitation Heart: PMI not displaced, RRR, s1 s2 normal, 1/6 systolic murmur, no diastolic murmur, no rubs, gallops, thrills, or heaves Abdomen: soft, nontender; no hepatosplenomehaly, BS+; abdominal aorta nontender and not dilated by palpation. Back: no CVA tenderness Pulses 2+ Musculoskeletal: full range of motion, normal strength, no joint deformities Extremities: no clubbing cyanosis or edema, Homan's sign negative  Neurologic: grossly nonfocal; Cranial nerves grossly wnl Psychologic: Normal  mood  and affect   ECG (independently read by me): Normal sinus rhythm with mild sinus arrhythmia at 65 bpm.  No ectopy.  Normal intervals  March 2020 ECG (independently read by me): Sinus bradycardia 59 bpm.  No ectopy.  Normal intervals.  April 2019 ECG (independently read by me): Normal sinus rhythm at 64 bpm.  PR interval 156 bili seconds.  QTc interval 456 ms.  January 2018 ECG (independently read by me):  Normal sinus rhythm at 67 bpm.  No ectopy.  Q wave in lead 3 and aVF with preserved R waves.  January 2016ECG: (Independently read by me): Normal sinus rhythm at 72 bpm.  No ectopy.  Normal intervals.  January 2015 ECG: (Independently read by me): Normal sinus rhythm at 71 beats per minute. No ST changes. Normal intervals.   LABS: BMP Latest Ref Rng & Units 03/09/2019 08/12/2018 03/03/2018  Glucose 65 - 99 mg/dL 110(H) 106(H) 102(H)  BUN 8 - 27 mg/dL 20 26 22   Creatinine 0.76 - 1.27 mg/dL 1.11 1.26 1.32(H)  BUN/Creat Ratio 10 - 24 18 21 17   Sodium 134 - 144 mmol/L 142 141 141  Potassium 3.5 - 5.2 mmol/L 4.6 4.8 4.8  Chloride 96 - 106 mmol/L 107(H) 106 106  CO2 20 - 29 mmol/L 21 21 23   Calcium 8.6 - 10.2 mg/dL 9.6 9.7 9.7   Hepatic Function Latest Ref Rng & Units 03/09/2019 08/12/2018 03/03/2018  Total Protein 6.0 - 8.5 g/dL 6.6 6.5 6.6  Albumin 3.7 - 4.7 g/dL 4.1 4.3 4.3  AST 0 - 40 IU/L 28 22 18   ALT 0 - 44 IU/L 31 27 22   Alk Phosphatase 39 - 117 IU/L 56 48 51  Total Bilirubin 0.0 - 1.2 mg/dL 0.5 0.4 0.6  Bilirubin, Direct 0.0 - 0.2 mg/dL - - -   CBC Latest Ref Rng & Units 03/09/2019 06/29/2016 06/03/2015  WBC 3.4 - 10.8 x10E3/uL 6.7 5.9 5.6  Hemoglobin 13.0 - 17.7 g/dL 12.8(L) 13.6 13.4  Hematocrit 37.5 - 51.0 % 38.7 40.5 39.7  Platelets 150 - 450 x10E3/uL 168 167 146(L)   Lab Results  Component Value Date   MCV 92 03/09/2019   MCV 90.2 06/29/2016   MCV 89.4 06/03/2015    No results found for: HGBA1C  Lab Results  Component Value Date   TSH 3.600 03/09/2019   Lipid  Panel     Component Value Date/Time   CHOL 191 03/09/2019 0843   TRIG 110 03/09/2019 0843   HDL 56 03/09/2019 0843   CHOLHDL 3.4 03/09/2019 0843   CHOLHDL 3.1 07/08/2017 0803   VLDL 26 06/29/2016 1021   LDLCALC 115 (H) 03/09/2019 0843   LDLCALC 94 07/08/2017 0803     RADIOLOGY: No results found.  IMPRESSION:  1. Essential hypertension   2. Mixed hyperlipidemia   3. OSA (obstructive sleep apnea)   4. Short-term memory loss   5. History of 2019 novel coronavirus disease (COVID-19)     ASSESSMENT AND PLAN: Mr. Clymer is a 78 year old gentleman who has a history of hypertension and hyperlipidemia. He has lost approximately 20 pounds over the last 10 years.  Remotely he was on Zetia and Livalo for hyperlipidemia and due to insurance issues the Livalo was ultimately changed to pravastatin.  He has developed some dementia and is now on Aricept and Namenda which is followed by neurology.  He was advised by them to discontinue the pravastatin and when I had last seen him he had been off therapy for  2 months and his LDL cholesterol had risen to 180.  When seen in March 2020 he was back on Zetia alone and LDL cholesterol improved to 110.  He also started to take his sons omega-3 fatty acid Lovaza capsule daily and his triglycerides have significantly improved to 63.  When I last saw him, since he felt that the statin did not affect his memory he was willing to reinitiate a trial of low-dose Crestor 5 mg.  Apparently, he is no longer taking this.  His most recent lipid panel from March 09, 2019 now shows his LDL at 115.  His blood pressure today is low and with his weight loss I have recommended reducing lisinopril to 2.5 mg.  He will monitor his blood pressure and if his blood pressure continues to be around 100 we will discontinue this altogether.  He is scheduled to undergo CPAP titration study per neurology on March 24, 2019 for reassessment of his OSA.  He has residual fatigue following  his recent COVID infection but over the last several weeks has been gaining significantly more strength.  I will see him in 6 months for reevaluation or sooner problems arise.  Time spent: 25 minutes Carlos Sine, MD, [facility]  03/12/2019 8:49 PM

## 2019-03-12 NOTE — Patient Instructions (Signed)
Medication Instructions:  Decrease Lisinopril to 1/2 tablet (2.5 mg) daily. If you need a refill on your cardiac medications before your next appointment, please call your pharmacy.   Lab work: CMET, LIPID in 6 months. Please come in anytime to have your labs drawn, no appointment needed.   They are fasting labs, so nothing to eat or drink after midnight.  Lab hours: 8:00-4:00 lunch hours 12:45-1:45  If you have labs (blood work) drawn today and your tests are completely normal, you will receive your results only by: Marland Kitchen MyChart Message (if you have MyChart) OR . A paper copy in the mail If you have any lab test that is abnormal or we need to change your treatment, we will call you to review the results.  Follow-Up: At [facility], you and your health needs are our priority.  As part of our continuing mission to provide you with exceptional heart care, we have created designated Provider Care Teams.  These Care Teams include your primary Cardiologist (physician) and Advanced Practice Providers (APPs -  Physician Assistants and Nurse Practitioners) who all work together to provide you with the care you need, when you need it. You will need a follow up appointment in 6 months.  Please call our office 2 months in advance to schedule this appointment.  You may see Dr.Kelly or one of the following Advanced Practice Providers on your designated Care Team: Almyra Deforest, [place] . Fabian Sharp, PA-C

## 2019-03-19 ENCOUNTER — Other Ambulatory Visit: Payer: Self-pay

## 2019-03-19 ENCOUNTER — Telehealth: Payer: Self-pay | Admitting: Cardiovascular Disease

## 2019-03-19 MED ORDER — EZETIMIBE 10 MG PO TABS: 10.0000 mg | ORAL_TABLET | Freq: Every day | ORAL | 3 refills | Status: DC

## 2019-03-19 NOTE — Telephone Encounter (Signed)
Pt c/o medication issue:  1. Name of Medication: ezetimibe (ZETIA) 10 MG tablet  2. How are you currently taking this medication (dosage and times per day)? 10 mg once daily  3. Are you having a reaction (difficulty breathing--STAT)? No   4. What is your medication issue? Does Dr. Claiborne Billings want the patient to continue taking this medication?  Wife of the patient called. She states that the patient did not receive this rx in the mail. The patient had a visit with Dr. Claiborne Billings 03-12-19, and all of his other medications got refilled but this one. The patient and the wife were under the impression that the patient should still be taking this. If he is not, please consult with the wife or the patient.

## 2019-03-19 NOTE — Telephone Encounter (Signed)
Per last note from [facility] on 10/01- he did not say to stop the medication, only changes to Lisinopril were made, I have sent the refill for the Zetia to patients pharmacy.

## 2019-03-19 NOTE — Telephone Encounter (Signed)
Updated message to reflect wife's cell phone. She may be out of the house. Please call on the cell number

## 2019-03-20 ENCOUNTER — Other Ambulatory Visit (HOSPITAL_COMMUNITY)
Admission: RE | Admit: 2019-03-20 | Discharge: 2019-03-20 | Disposition: A | Discharge status: Home / Self Care | Payer: Medicare HMO | Source: Ambulatory Visit | Attending: Neurology | Admitting: Neurology

## 2019-03-20 DIAGNOSIS — Z01812 Encounter for preprocedural laboratory examination: Secondary | ICD-10-CM | POA: Diagnosis present

## 2019-03-20 DIAGNOSIS — Z20828 Contact with and (suspected) exposure to other viral communicable diseases: Secondary | ICD-10-CM | POA: Diagnosis not present

## 2019-03-21 LAB — NOVEL CORONAVIRUS, NAA (HOSP ORDER, SEND-OUT TO REF LAB; TAT 18-24 HRS): SARS-CoV-2, NAA: NOT DETECTED

## 2019-03-24 ENCOUNTER — Ambulatory Visit (INDEPENDENT_AMBULATORY_CARE_PROVIDER_SITE_OTHER): Payer: Medicare HMO | Admitting: Neurology

## 2019-03-24 DIAGNOSIS — G4733 Obstructive sleep apnea (adult) (pediatric): Secondary | ICD-10-CM

## 2019-03-24 DIAGNOSIS — G4731 Primary central sleep apnea: Secondary | ICD-10-CM

## 2019-03-24 DIAGNOSIS — G4720 Circadian rhythm sleep disorder, unspecified type: Secondary | ICD-10-CM

## 2019-03-24 DIAGNOSIS — Z9989 Dependence on other enabling machines and devices: Secondary | ICD-10-CM

## 2019-04-01 ENCOUNTER — Telehealth: Payer: Self-pay

## 2019-04-01 NOTE — Telephone Encounter (Signed)
I called pt to discuss his sleep study results. No answer, left a message asking him to call me back. 

## 2019-04-01 NOTE — Addendum Note (Signed)
Addended by: Star Age on: 04/01/2019 08:52 AM   Modules accepted: Orders

## 2019-04-01 NOTE — Telephone Encounter (Signed)
-----   Message from Star Age, MD sent at 04/01/2019  8:51 AM EDT ----- Patient has been on CPAP with high residual AHI, trial of autoPAP in September was not successful either, last seen by Amy L, NP on 12/29/18 and we advised him to come in for titration study, which he had on 03/24/19.  Please call patient: I would like for him to consider coming in for yet another study, his sleep apnea is complex and he did not sleep well, about 50% of the time. Not successfully treated with CPAP, BiPAP and BiPAP ST. I would like to suggest he return for BiPAP titration, with ST or even ASV. I will place order, please let me know, if he does not agree, I am not sure how to change is settings at this time.  Michel Bickers

## 2019-04-01 NOTE — Procedures (Signed)
PATIENT'S NAME:  Carlos Pierce, Carlos Pierce DOB:      1941/01/06      MR#:    [id number]     DATE OF RECORDING: 03/24/2019 REFERRING M.D.:  Debbora Presto, NP Study Performed:   CPAP  Titration HISTORY: 78 y.o. male with an underlying medical history of memory loss, Hyperlipidemia, borderline overweight state and status post cataract repairs, who presents for a full night titration study to optimize his sleep apnea treatment. He has been on CPAP of 7 cm with his residual AHI and a trial of autoPAP was also not successful in September 2020. The patient's weight 151 pounds with a height of 65 (inches), resulting in a BMI of 25. kg/m2. The patient's neck circumference measured 16 inches.  CURRENT MEDICATIONS: Prevagen, Aspirin, Astragalus, Bee Pollen, Bran, Yeast, Q10, Zetia, Proscar, Flonase, Ginko Biloba, L-Tyrosine, Prinivil, Lutein-Zeaxanthin, Milk Thistle, Multivitamin, Omega 3, Pitavastatin Calcium, Pravachol   PROCEDURE:  This is a multichannel digital polysomnogram utilizing the SomnoStar 11.2 system.  Electrodes and sensors were applied and monitored per AASM Specifications.   EEG, EOG, Chin and Limb EMG, were sampled at 200 Hz.  ECG, Snore and Nasal Pressure, Thermal Airflow, Respiratory Effort, CPAP Flow and Pressure, Oximetry was sampled at 50 Hz. Digital video and audio were recorded.      The patient was fitted with a small Dreamwear FFM. CPAP was initiated at 5 cm H20 with heated humidity per AASM standards and pressure was advanced to 12 cmH20 because of hypopneas, apneas and desaturations. His AHI ranged from 39.2/hour to 14.2/hour. He was switched to BIPAP standard of 13/9 cm but had persistent centrals, over 50% of the events. He was titrated to 15/11 cmH20, and a backup rate of 14 was added. The final AHI was elevated at 45/hour, with primarily central events. O2 nadir was 79%.   Lights Out was at 20:50 and Lights On at 04:54. Total recording time (TRT) was 484.5 minutes, with a total sleep time  (TST) of 250.5 minutes. The patient's sleep latency was 96.5 minutes, which is markedly delayed. REM latency was 75 minutes.  The sleep efficiency was 51.7 %, which is markedly reduced.    SLEEP ARCHITECTURE: WASO (Wake after sleep onset)  was 145 minutes with long periods of wakefulness in the later part of the night. There were 8.5 minutes in Stage N1, 198.5 minutes Stage N2, 0 minutes Stage N3 and 43.5 minutes in Stage REM.  The percentage of Stage N1 was 3.4%, Stage N2 was 79.2%, which is markedly increased, Stage N3 was absent, and Stage R (REM sleep) was 17.4%. the arousals were noted as: 40 were spontaneous, 0 were associated with PLMs, 33 were associated with respiratory events.  RESPIRATORY ANALYSIS:  There was a total of 100 respiratory events: 30 obstructive apneas, 44 central apneas and 5 mixed apneas with a total of 79 apneas and an apnea index (AI) of 18.9 /hour. There were 21 hypopneas with a hypopnea index of 5./hour. The patient also had 0 respiratory event related arousals (RERAs).      The total APNEA/HYPOPNEA INDEX  (AHI) was 24. /hour and the total RESPIRATORY DISTURBANCE INDEX was 24. /hour  12 events occurred in REM sleep and 88 events in NREM. The REM AHI was 16.6 /hour versus a non-REM AHI of 25.5 /hour.  The patient spent 200 minutes of total sleep time in the supine position and 51 minutes in non-supine. The supine AHI was 30.0, versus a non-supine AHI of 0.0.  OXYGEN SATURATION &  C02:  The baseline 02 saturation was 96%, with the lowest being 71%. Time spent below 89% saturation equaled 20 minutes.  PERIODIC LIMB MOVEMENTS:  The patient had a total of 6 Periodic Limb Movements. The Periodic Limb Movement (PLM) index was 1.4 and the PLM Arousal index was 0 /hour.  Audio and video analysis did not show any abnormal or unusual movements, behaviors, phonations or vocalizations. The patient took 2 bathroom breaks. The EKG was in keeping with normal sinus rhythm.  Post-study, the  patient indicated that sleep was the same as usual.   DIAGNOSIS 1. Complex sleep apnea 2. Primary central sleep apnea, post CPAP of 7 cm 3. Insufficient treatment with CPAP 4. Obstructive Sleep Apnea  5. Dysfunctions associated with sleep stages or arousal from sleep  PLANS/RECOMMENDATIONS: 1. This study was not successful in eliminating the patient's complex sleep apnea, despite BiPAP ST. He had minimal sleep during the BiPAP portion of the study and may still respond favorably on BiPAP ST vs. ASV. A re-titration study with BiPAP (with transition to BiPAP ST or even ASV) is recommended. CPAP failed to treat his OSA and CSA. He previously also failed an autoPAP trial at home. On CPAP of 7 cm, his AHI was 40/h, O2 nadir 80%. Above 7 cm, his sleep disordered breathing was primarily central. 2. This study shows sleep fragmentation and abnormal sleep stage percentages; these are nonspecific findings and per se do not signify an intrinsic sleep disorder or a cause for the patient's sleep-related symptoms. Causes include (but are not limited to) the first night effect of the sleep study, circadian rhythm disturbances, medication effect or an underlying mood disorder or medical problem.  3. The patient should be cautioned not to drive, work at heights, or operate dangerous or heavy equipment when tired or sleepy. Review and reiteration of good sleep hygiene measures should be pursued with any patient. 4. The patient will be seen in follow-up in the sleep clinic at [facility] for discussion of the test results, symptom and treatment compliance review, further management strategies, etc. The referring provider will be notified of the test results.  I certify that I have reviewed the entire raw data recording prior to the issuance of this report in accordance with the Standards of Accreditation of the American Academy of Sleep Medicine (AASM)  Star Age, MD, PhD Diplomat, American Board of Neurology and Sleep  Medicine ( Neurology and Sleep Medicine)

## 2019-04-01 NOTE — Progress Notes (Signed)
Patient has been on CPAP with high residual AHI, trial of autoPAP in September was not successful either, last seen by Amy L, NP on 12/29/18 and we advised him to come in for titration study, which he had on 03/24/19.  Please call patient: I would like for him to consider coming in for yet another study, his sleep apnea is complex and he did not sleep well, about 50% of the time. Not successfully treated with CPAP, BiPAP and BiPAP ST. I would like to suggest he return for BiPAP titration, with ST or even ASV. I will place order, please let me know, if he does not agree, I am not sure how to change is settings at this time.  Carlos Pierce

## 2019-04-02 NOTE — Telephone Encounter (Signed)
Thank you :)

## 2019-04-02 NOTE — Telephone Encounter (Signed)
Sleep lab has received order for CPAP study. Waiting for response from insurance for an authorization.  Will call patients home number when auth is received.

## 2019-04-02 NOTE — Telephone Encounter (Signed)
I called pt, discussed his sleep study results with him. He is agreeable to another titration study. I advised him that our sleep lab will be in touch with him to schedule it.   Pt asked to be called at his home number for sleep study scheduling.

## 2019-04-20 ENCOUNTER — Telehealth: Payer: Self-pay | Admitting: Family Medicine

## 2019-04-20 ENCOUNTER — Encounter: Payer: Self-pay | Admitting: Family Medicine

## 2019-04-20 NOTE — Telephone Encounter (Signed)
Meagan, can you call family with an update regarding additional titration study? TY!

## 2019-04-20 NOTE — Telephone Encounter (Signed)
I have just received authorization from insurance. I will call patient as soon as I can.

## 2019-04-21 ENCOUNTER — Encounter: Payer: Self-pay | Admitting: Family Medicine

## 2019-05-26 ENCOUNTER — Other Ambulatory Visit (HOSPITAL_COMMUNITY)
Admission: RE | Admit: 2019-05-26 | Discharge: 2019-05-26 | Disposition: A | Discharge status: Home / Self Care | Payer: Medicare HMO | Source: Ambulatory Visit | Attending: Neurology | Admitting: Neurology

## 2019-05-26 DIAGNOSIS — Z01812 Encounter for preprocedural laboratory examination: Secondary | ICD-10-CM | POA: Diagnosis present

## 2019-05-26 DIAGNOSIS — Z20828 Contact with and (suspected) exposure to other viral communicable diseases: Secondary | ICD-10-CM | POA: Insufficient documentation

## 2019-05-27 LAB — NOVEL CORONAVIRUS, NAA (HOSP ORDER, SEND-OUT TO REF LAB; TAT 18-24 HRS): SARS-CoV-2, NAA: NOT DETECTED

## 2019-05-29 ENCOUNTER — Ambulatory Visit (INDEPENDENT_AMBULATORY_CARE_PROVIDER_SITE_OTHER): Payer: Medicare HMO | Admitting: Neurology

## 2019-05-29 ENCOUNTER — Other Ambulatory Visit: Payer: Self-pay

## 2019-05-29 DIAGNOSIS — Z9989 Dependence on other enabling machines and devices: Secondary | ICD-10-CM

## 2019-05-29 DIAGNOSIS — G4733 Obstructive sleep apnea (adult) (pediatric): Secondary | ICD-10-CM

## 2019-05-29 DIAGNOSIS — G4720 Circadian rhythm sleep disorder, unspecified type: Secondary | ICD-10-CM

## 2019-05-29 DIAGNOSIS — Z789 Other specified health status: Secondary | ICD-10-CM

## 2019-05-29 DIAGNOSIS — G4734 Idiopathic sleep related nonobstructive alveolar hypoventilation: Secondary | ICD-10-CM

## 2019-05-29 DIAGNOSIS — G4731 Primary central sleep apnea: Secondary | ICD-10-CM

## 2019-06-02 ENCOUNTER — Telehealth: Payer: Self-pay | Admitting: Neurology

## 2019-06-02 NOTE — Progress Notes (Signed)
Patient had been on CPAP with high residual AHI, trial of autoPAP in September was not successful either, last seen by Amy L, NP on 12/29/18 and we advised him to come in for titration study, which he had on 03/24/19; this was unsuccessful on CPAP, BiPAP or BiPAP ST. He had a BiPAP titration on 05/29/19, which was very challenging, and he again failed BiPAP, BiPAP ST. Eventually, he did well on ASV, but needed O2 at 1 lpm bleed-in. I will order ASV treatment. We will send order to his DME. He will need a FU within 60-90 days, may see Amy as well.

## 2019-06-02 NOTE — Procedures (Signed)
PATIENT'S NAME:  Carlos Pierce, Carlos Pierce DOB:      [birth year]      MR#:    [id number]     DATE OF RECORDING: 05/29/2019 REFERRING M.D.:  Juanita Craver, MD Study Performed:   CPAP  Titration HISTORY: 78 year-old male with an underlying medical history of memory loss, Hyperlipidemia, borderline overweight state and status post cataract repairs, who presents for a full night titration study to optimize his complex sleep apnea treatment. He was on CPAP of 7 cm with high residual AHI and a trial of autoPAP was also not successful in September 2020. He had a full night titration study on 03/24/19, which was not successful on BiPAP or BiPAP ST or CPAP. He presents for BiPAP titration vs. ASV. The patient's weight 151 pounds with a height of 65 (inches), resulting in a BMI of 25. kg/m2. The patient's neck circumference measured 16 inches.  CURRENT MEDICATIONS: Prevagen, Aspirin, Astragalus, Bee Pollen, Bran, Yeast, Q10, Zetia, Proscar, Flonase, Ginko Biloba, L-Tyrosine, Prinivil, Lutein-Zeaxanthin, Milk Thistle, Multivitamin, Omega 3, Pitavastatin Calcium, Pravachol.  PROCEDURE:  This is a multichannel digital polysomnogram utilizing the SomnoStar 11.2 system.  Electrodes and sensors were applied and monitored per AASM Specifications.   EEG, EOG, Chin and Limb EMG, were sampled at 200 Hz.  ECG, Snore and Nasal Pressure, Thermal Airflow, Respiratory Effort, CPAP Flow and Pressure, Oximetry was sampled at 50 Hz. Digital video and audio were recorded.      The patient was tried on 4 different FFM styles and sizes (Dreamwear, F20, Mirage Quattro) and eventually did fairly well with the small Quattro FFM. He was started on standard BiPAP of 8/4 cm and was noted to have frequent and long central apneas. BiPAP was titrated per AASM standards and pressure was advanced to 10/5 cm with AHI of 64/hour with central apneas noted. A back up rate was added at 12/min, but AHI was high and he had ongoing desaturations. He was  switched to ASV with EPAP of 5, and EPAP was increased to up to 9 cm initially. He was then, at 21:43, again tried on BiPAP ST and titrated to a pressure of 20/15 cm with severe desaturations noted in supine REM sleep in the 40s. He was maxed out on BiPAP ST at 25/20 cm and still had desaturations into the 60s. At 22:34 he was switched back to ASV. Even at a high ASV pressure of EPAP of 15 cm, he had ongoing desaturations and since he was not able to tolerate any higher pressure, oxygen was added at 1 lpm bleed in at 00:00. After that, his O2 saturations remained in the lower to mid 90s, supine REM sleep was achieve and AHI was maintained in the single digits.   Lights Out was at 20:33 and Lights On at 04:01. Total recording time (TRT) was 448.5 minutes, with a total sleep time (TST) of 358.5 minutes. The patient's sleep latency was 19.5 minutes. REM latency was 90.5 minutes.  The sleep efficiency was 79.9%.    SLEEP ARCHITECTURE: WASO (Wake after sleep onset) was 78 minutes with initially moderate, then mild sleep fragmentation. There were 32 minutes in Stage N1, 237 minutes Stage N2, 20.5 minutes Stage N3 and 69 minutes in Stage REM.  The percentage of Stage N1 was 8.9%, Stage N2 was 66.1%, which is increased, Stage N3 was 5.7% and Stage R (REM sleep) was 19.2%. The arousals were noted as: 46 were spontaneous, 0 were associated with PLMs, 54 were associated with respiratory events.  RESPIRATORY ANALYSIS:  There was a total of 115 respiratory events: 0 obstructive apneas, 42 central apneas and 32 mixed apneas with a total of 74 apneas and an apnea index (AI) of 12.4 /hour. There were 41 hypopneas with a hypopnea index of 6.9/hour. The patient also had 0 respiratory event related arousals (RERAs).      The total APNEA/HYPOPNEA INDEX  (AHI) was 19.2 /hour and the total RESPIRATORY DISTURBANCE INDEX was 19.2 /hour  11 events occurred in REM sleep and 104 events in NREM. The REM AHI was 9.6 /hour versus a  non-REM AHI of 21.6 /hour.  The patient spent 244 minutes of total sleep time in the supine position and 115 minutes in non-supine. The supine AHI was 28.3, versus a non-supine AHI of 0.0.  OXYGEN SATURATION & C02:  The baseline 02 saturation was 94%, with the lowest being 37%. Time spent below 89% saturation equaled 48 minutes.  PERIODIC LIMB MOVEMENTS:  The patient had a total of 0 Periodic Limb Movements. The Periodic Limb Movement (PLM) index was 0 and the PLM Arousal index was 0 /hour.  Audio and video analysis did not show any abnormal or unusual movements, behaviors, phonations or vocalizations. The patient took 1 bathroom break. EKG was in keeping with normal sinus rhythm (NSR).  Post-study, the patient indicated that sleep was better than usual.   DIAGNOSIS 1. Primary central sleep apnea  2. Complex sleep apnea 3. Insufficient treatment with BiPAP, BiPAP ST 4. Nocturnal hypoxemia 5. Dysfunctions associated with sleep stages or arousal from sleep   PLANS/RECOMMENDATIONS: 1. This study was very challenging. In the last titration study on 03/24/19, he had minimal sleep during the BiPAP portion of the study. During this study, he had ongoing severe central sleep apnea with severe desaturations into the 40 with maximum BiPAP ST and required his pressure ASV, with additional O2 to maintain oxygen saturations above 90. With high EPAP ASV only, his O2 saturations dipped into the lower 70s, despite maximum ASV tolerated. I will recommend treatment at home with ASV: EPAP of 15 cm, max. PS of 10, min. PS of 3 with 1 lpm supplemental O2.  2. This study shows sleep fragmentation and abnormal sleep stage percentages, but much better sleep consolidation later in the study on ASV of 15 cm and O2 supplementation. These are nonspecific findings and per se do not signify an intrinsic sleep disorder or a cause for the patient's sleep-related symptoms. Causes include (but are not limited to) the first night  effect of the sleep study (although this is his 3rd sleep study), circadian rhythm disturbances, medication effect or an underlying mood disorder or medical problem.  3. The patient should be cautioned not to drive, work at heights, or operate dangerous or heavy equipment when tired or sleepy. Review and reiteration of good sleep hygiene measures should be pursued with any patient. 4. The patient will be seen in follow-up in the sleep clinic at [facility] for discussion of the test results, symptom and treatment compliance review, further management strategies, etc. The referring provider will be notified of the test results.   I certify that I have reviewed the entire raw data recording prior to the issuance of this report in accordance with the Standards of Accreditation of the American Academy of Sleep Medicine (AASM)   Star Age, MD, PhD Diplomat, American Board of Neurology and Sleep Medicine ( Neurology and Sleep Medicine)

## 2019-06-02 NOTE — Telephone Encounter (Signed)
-----   Message from Star Age, MD sent at 06/02/2019  8:26 AM EST ----- Patient had been on CPAP with high residual AHI, trial of autoPAP in September was not successful either, last seen by Amy L, NP on 12/29/18 and we advised him to come in for titration study, which he had on 03/24/19; this was unsuccessful on CPAP, BiPAP or BiPAP ST. He had a BiPAP titration on 05/29/19, which was very challenging, and he again failed BiPAP, BiPAP ST. Eventually, he did well on ASV, but needed O2 at 1 lpm bleed-in. I will order ASV treatment. We will send order to his DME. He will need a FU within 60-90 days, may see Amy as well.

## 2019-06-02 NOTE — Telephone Encounter (Signed)
Called the patient and he has advised that I call his wife.  I called pt's wife. I advised pt that Dr. [name] reviewed their sleep study results and found that pt best tolerated ASV. Dr. [name] recommends that pt start ASV machine with 1 L oxygen bled into. I reviewed PAP compliance expectations with the pt. Pt is agreeable to starting a CPAP. I advised pt that an order will be sent to a DME, [facility], and [facility] will call the pt within about one week after they file with the pt's insurance. [facility] will show the pt how to use the machine, fit for masks, and troubleshoot the CPAP if needed. A follow up appt was made for insurance purposes with Debbora Presto, NP on March 18,2021 at 9 am. Pt verbalized understanding to arrive 15 minutes early and bring their CPAP. A letter with all of this information in it will be mailed to the pt as a reminder. I verified with the pt that the address we have on file is correct. Pt verbalized understanding of results. Pt had no questions at this time but was encouraged to call back if questions arise. I have sent the order to [facility] and have received confirmation that they have received the order.

## 2019-06-02 NOTE — Addendum Note (Signed)
Addended by: Star Age on: 06/02/2019 08:26 AM   Modules accepted: Orders

## 2019-07-01 ENCOUNTER — Ambulatory Visit: Payer: Medicare HMO | Admitting: Family Medicine

## 2019-08-23 ENCOUNTER — Encounter: Payer: Self-pay | Admitting: Family Medicine

## 2019-08-26 NOTE — Progress Notes (Addendum)
PATIENT: Carlos Pierce DOB: Dec 21, 1940  REASON FOR VISIT: follow up HISTORY FROM: patient  Chief Complaint  Patient presents with   Follow-up    Rm 2, wife.    Initial ASV     HISTORY OF PRESENT ILLNESS: Today 08/27/19 Carlos Pierce is a 79 y.o. male here today for follow up for dementia and OSA recently started on ASV. AHI remained elevated on CPAP and titration study showed resolution with ASV with O2 at 1L. He returns today for compliance review.  He reports that he is doing fairly well with therapy.  He does mention concerns of not having a chinstrap.  When fitted in the office during his titration study, he did well with a small Quatro full facemask and chinstrap.  He is uncertain of what mask he is using now.  He has noted an increased leak when he sleeps on his side.  He had a.  Of time where he fell and was unable to use his machine for about a week.  He reports slipping after stepping down from the fireplace.  He denies any difficulty with recurrent falls or changes in gait. He continues Namenda 10mg  BID and Aricept 10mg  QHS. He feels that memory is fairly stable. He has good and bad days.  He is able to perform all ADLs independently.  He is driving without difficulty.  He is able to manage finances and take medications independently.  Compliance report dated 06/27/2019 through 08/25/2019 reveals that he has used ASV therapy 44 of the past 60 days for compliance of 73%.  He has used ASV therapy greater than 4 hours 40 of the past 60 days for compliance of 67%.  Average usage on days used was 6 hours and 11 minutes.  Residual AHI was 4.0 with a EPAP of 15 cm of water, minimum pressure support of 3 cm of water and maximum pressure support of 10 cm of water.  There was a significant leak noted in the 95th percentile of 77.4 L/min.   HISTORY: (copied from my note on 12/29/2018)  Carlos Pierce is a 79 y.o. male here today for follow up os OSA on CPAP.  He  continues to report improvement in overall sleep quality as well as fatigue levels during the day.  He is also noted mild improvement of memory loss.  He is using CPAP nightly.  Compliance data report dated 11/25/2018 through 12/24/2018 when he is using his CPAP machine 30 out of the last 30 days.  29 of those days he used his machine greater than 4 hours.  Average usage was about 6 hours and 52 minutes.  AHI remains elevated at 26.  CPAP is at set pressure of 8 cm of water and EPR of 3.  There was no significant leak.  He denies any trouble with equipment.  He feels that his mask is well fitting.  He has no concerns today.   HISTORY: (copied from my note on 11/11/2018)  Carlos Pierce a 79 y.o.malefor follow up of dementia.He is taking donepezil 10mg  daily as well as memantine 10mg  twice daily.He reports that he is doing very well on the medication with no obvious adverse effects. His wife is present with him today and aids in history. She states that she feels that overall he is doing fairly well. He does continue to have trouble with word recall. There was concern of depression in the past but she does not feel that that is a  current issue. 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 He is able to perform all ADLs and still drives without difficulty.  Carlos Pierce, his wife, mentions during the visit that he is a CPAP user and has not had recent follow-up. I have requested a CPAP download and report is as follows:  10/11/2018 - 11/09/2018 20 - 11/09/2018 Usage days 22/30 days (73%) >= 4 hours 20 days (67%) < 4 hours 2 days (7%) Usage hours 149 hours 19 minutes Average usage (total days) 4 hours 59 minutes Average usage (days used) 6 hours 47 minutes Median usage (days used) 7 hours 18 minutes Total used hours (value since last reset - 11/09/2018) 5,007 hours AirSense 10 AutoSet Serial number [id number] Mode CPAP Set pressure 7 cmH2O EPR  Fulltime EPR level 3 Therapy Leaks - L/min Median: 1.1 95th percentile: 13.4 Maximum: 22.5 Events per hour AI: 22.8 HI: 0.1 AHI: 22.9 Apnea Index Central: 0.1 Obstructive: 22.4 Unknown: 0.2 RERA Index 0.4 Cheyne-Stokes respiration (average duration per night) 0 minutes (0%)   HISTORY(copied from Dr Gladstone Lighter note on 11/26/2017)  UPDATE (11/26/17, VRP): Since last visit, doingwell. Toleratingmemantine. No alleviating or aggravating factors.Feels like memory is slightly better. ADLs stable.  UPDATE (05/23/17, VRP): Since last visit, doing well per patient. Wife thinks there has been mild decline over last year, esp slower with conversations and losing train of thought and driving directions. No alleviating or aggravating factors. Eating well. No major change in ADLs. He stopped BP meds and chol meds to see if memory loss would improve, but he is not sure if there has been any change.   UPDATE 10/29/16: Since last visit, doing well. Memory loss stable. Now trying to stay calm when he forgets, and then the name or thought returns. Also with mild OSA, planning to start CPAP next month.   PRIOR HPI (07/23/16): 79 year old right-handed male here for evaluation of memory loss. Patient reports 1-2 months of mild short-term memory loss, losing track of his train of thought, word finding difficulties, difficulty with name recall, forgetting how to stay on task. 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 Otherwise he is able to maintain all of his activities of daily living. He takes care of paying the bills and finances at home. He does light chores around the house. Patient's wife reports at least one year of duration of the symptoms. She also notes that he is having more trouble with driving directions, recall of people's names, telling a story and then getting off track at the end. Patient is otherwise very active physically. He exercises 4-5 times per week.   REVIEW OF SYSTEMS: Out of a  complete 14 system review of symptoms, the patient complains only of the following symptoms, memory loss and all other reviewed systems are negative.  ESS: 13  ALLERGIES: No Known Allergies  HOME MEDICATIONS: Outpatient Medications Prior to Visit  Medication Sig Dispense Refill   Bran 500 MG TABS Take 1 tablet by mouth 2 (two) times daily.     Coenzyme Q10 (CO Q 10 PO) Take 300 mg by mouth daily.     ezetimibe (ZETIA) 10 MG tablet Take 1 tablet (10 mg total) by mouth daily. 90 tablet 3   famotidine (PEPCID) 20 MG tablet Take 20 mg by mouth daily.      Ginkgo Biloba 40 MG TABS Take 240 mg by mouth daily.     L-Tyrosine 500 MG CAPS Take 1 capsule by mouth  daily.     lisinopril (ZESTRIL) 5 MG tablet Take 0.5 tablets (2.5 mg total) by mouth daily. 90 tablet 0   Milk Thistle 1000 MG CAPS Take 1 capsule by mouth daily.     Multiple Vitamins-Minerals (MULTIVITAMIN & MINERAL PO) Take 1 tablet by mouth daily.     omega-3 acid ethyl esters (LOVAZA) 1 g capsule Take 1 capsule (1 g total) by mouth daily. 90 capsule 1   donepezil (ARICEPT) 10 MG tablet Take 1 tablet (10 mg total) by mouth at bedtime. 90 tablet 4   memantine (NAMENDA) 10 MG tablet Take 1 tablet (10 mg total) by mouth 2 (two) times daily. 180 tablet 4   Omega-3 Fatty Acids (OMEGA 3 PO) Take 1,000 mg by mouth daily.     No facility-administered medications prior to visit.    PAST MEDICAL HISTORY: Past Medical History:  Diagnosis Date   Memory loss     PAST SURGICAL HISTORY: Past Surgical History:  Procedure Laterality Date   CATARACT EXTRACTION Bilateral    HEMORRHOID BANDING  2016    FAMILY HISTORY: History reviewed. No pertinent family history.  SOCIAL HISTORY: Social History   Socioeconomic History   Marital status: Married    Spouse name: Carlos Pierce   Number of children: 1   Years of education: 14   Highest education level: Not on file  Occupational History    Comment: [employer]    Tobacco Use   Smoking status: Never Smoker   Smokeless tobacco: Never Used  Substance and Sexual Activity   Alcohol use: Yes    Alcohol/week: 2.0 standard drinks    Types: 2 Standard drinks or equivalent per week    Comment: wine a few days a week   Drug use: No   Sexual activity: Not on file  Other Topics Concern   Not on file  Social History Narrative   Drinks 1 cup of coffee a day    Social Determinants of Health   Financial Resource Strain:    Difficulty of Paying Living Expenses:   Food Insecurity:    Worried About Charity fundraiser in the Last Year:    Arboriculturist in the Last Year:   Transportation Needs:    Film/video editor (Medical):    Lack of Transportation (Non-Medical):   Physical Activity:    Days of Exercise per Week:    Minutes of Exercise per Session:   Stress:    Feeling of Stress :   Social Connections:    Frequency of Communication with Friends and Family:    Frequency of Social Gatherings with Friends and Family:    Attends Religious Services:    Active Member of Clubs or Organizations:    Attends Archivist Meetings:    Marital Status:   Intimate Partner Violence:    Fear of Current or Ex-Partner:    Emotionally Abused:    Physically Abused:    Sexually Abused:       PHYSICAL EXAM  Vitals:   08/27/19 0844  BP: 124/74  Pulse: (!) 55  Temp: (!) 96.9 F (36.1 C)  Weight: 152 lb 9.6 oz (69.2 kg)  Height: 5\' 5"  (1.651 m)   Body mass index is 25.39 kg/m.  Generalized: Well developed, in no acute distress  Cardiology: normal rate and rhythm, no murmur noted Neurological examination  Mentation: Alert oriented to time, place, history taking. Follows all commands speech and language fluent Cranial nerve II-XII: Pupils  were equal round reactive to light. Extraocular movements were full, visual field were full on confrontational test. Facial sensation and strength were normal. Uvula tongue  midline. Head turning and shoulder shrug  were normal and symmetric. Motor: The motor testing reveals 5 over 5 strength of all 4 extremities. Good symmetric motor tone is noted throughout.  Sensory: Sensory testing is intact to soft touch on all 4 extremities. No evidence of extinction is noted.  Coordination: Cerebellar testing reveals good finger-nose-finger and heel-to-shin bilaterally.  Gait and station: Gait is normal. Tandem gait is normal. Romberg is negative. No drift is seen.  Reflexes: Deep tendon reflexes are symmetric and normal bilaterally.   DIAGNOSTIC DATA (LABS, IMAGING, TESTING) - I reviewed patient records, labs, notes, testing and imaging myself where available.  MMSE - Mini Mental State Exam 08/27/2019 11/26/2017 05/23/2017  Orientation to time 4 3 3   Orientation to Place 3 3 4   Registration 3 3 3   Attention/ Calculation 4 5 4   Recall 1 0 1  Language- name 2 objects 2 2 2   Language- repeat 0 1 1  Language- follow 3 step command 3 3 3   Language- read & follow direction 1 1 1   Write a sentence 1 1 1   Copy design 1 0 0  Total score 23 22 23      Lab Results  Component Value Date   WBC 6.7 03/09/2019   HGB 12.8 (L) 03/09/2019   HCT 38.7 03/09/2019   MCV 92 03/09/2019   PLT 168 03/09/2019      Component Value Date/Time   NA 142 03/09/2019 0843   K 4.6 03/09/2019 0843   CL 107 (H) 03/09/2019 0843   CO2 21 03/09/2019 0843   GLUCOSE 110 (H) 03/09/2019 0843   GLUCOSE 103 (H) 06/29/2016 1021   BUN 20 03/09/2019 0843   CREATININE 1.11 03/09/2019 0843   CREATININE 1.17 06/29/2016 1021   CALCIUM 9.6 03/09/2019 0843   PROT 6.6 03/09/2019 0843   ALBUMIN 4.1 03/09/2019 0843   AST 28 03/09/2019 0843   ALT 31 03/09/2019 0843   ALKPHOS 56 03/09/2019 0843   BILITOT 0.5 03/09/2019 0843   GFRNONAA 64 03/09/2019 0843   GFRAA 74 03/09/2019 0843   Lab Results  Component Value Date   CHOL 191 03/09/2019   HDL 56 03/09/2019   LDLCALC 115 (H) 03/09/2019   TRIG 110  03/09/2019   CHOLHDL 3.4 03/09/2019   No results found for: HGBA1C Lab Results  Component Value Date   VITAMINB12 815 07/23/2016   Lab Results  Component Value Date   TSH 3.600 03/09/2019       ASSESSMENT AND PLAN 79 y.o. year old male  has a past medical history of Memory loss. here with     ICD-10-CM   1. OSA (obstructive sleep apnea)  G47.33 For home use only DME Bipap  2. ASV (adaptive servo-ventilation) use counseling  Z71.89 For home use only DME Bipap  3. Memory loss  R41.3 donepezil (ARICEPT) 10 MG tablet    memantine (NAMENDA) 10 MG tablet    Yaya reports that he has done fairly well with ASV therapy at home.  He does have concerns of an increased air leak and feels that he needs to have a different mask.  We will place orders today for a mask refitting.  He was encouraged to use ASV therapy nightly and for greater than 4 hours each night.  He will continue Namenda and Aricept as prescribed.  We have  reviewed memory compensation strategies and encouraged regular physical activity.  MMSE is stable from 2019.  He will continue close follow-up with primary care for management of comorbidities.  He will follow-up with me in 3 months, sooner if needed.  He verbalizes understanding and agreement with this plan.   Orders Placed This Encounter  Procedures   For home use only DME Bipap    Mask refitting: patient was fitted with small Quattro FFM with chin strap at titration study.    Order Specific Question:   Length of Need    Answer:   Lifetime    Order Specific Question:   Inspiratory pressure    Answer:   OTHER SEE COMMENTS    Order Specific Question:   Expiratory pressure    Answer:   OTHER SEE COMMENTS     Meds ordered this encounter  Medications   donepezil (ARICEPT) 10 MG tablet    Sig: Take 1 tablet (10 mg total) by mouth at bedtime.    Dispense:  90 tablet    Refill:  4    Order Specific Question:   Supervising Provider    Answer:   Melvenia Beam  [id number]   memantine (NAMENDA) 10 MG tablet    Sig: Take 1 tablet (10 mg total) by mouth 2 (two) times daily.    Dispense:  180 tablet    Refill:  4    Order Specific Question:   Supervising Provider    Answer:   Melvenia Beam [id number]      I spent 30 minutes with the patient. 50% of this time was spent counseling and educating patient on plan of care and medications.    Debbora Presto, FNP-C 08/27/2019, 9:56 AM [facility] [address] [place],  [address] [phone]  I reviewed the above note and documentation by the Nurse Practitioner and agree with the history, exam, assessment and plan as outlined above. I was available for consultation. Star Age, MD, PhD [facility])

## 2019-08-26 NOTE — Patient Instructions (Signed)
Continue ASV nightly and greater than 4 hours each night   Continue Namenda and Aricept as prescribed   Follow up in 6 months   Memory Compensation Strategies  1. Use "WARM" strategy.  W= write it down  A= associate it  R= repeat it  M= make a mental note  2.   You can keep a Social worker.  Use a 3-ring notebook with sections for the following: calendar, important names and phone numbers,  medications, doctors' names/phone numbers, lists/reminders, and a section to journal what you did  each day.   3.    Use a calendar to write appointments down.  4.    Write yourself a schedule for the day.  This can be placed on the calendar or in a separate section of the Memory Notebook.  Keeping a  regular schedule can help memory.  5.    Use medication organizer with sections for each day or morning/evening pills.  You may need help loading it  6.    Keep a basket, or pegboard by the door.  Place items that you need to take out with you in the basket or on the pegboard.  You may also want to  include a message board for reminders.  7.    Use sticky notes.  Place sticky notes with reminders in a place where the task is performed.  For example: " turn off the  stove" placed by the stove, "lock the door" placed on the door at eye level, " take your medications" on  the bathroom mirror or by the place where you normally take your medications.  8.    Use alarms/timers.  Use while cooking to remind yourself to check on food or as a reminder to take your medicine, or as a  reminder to make a call, or as a reminder to perform another task, etc.   Sleep Apnea Sleep apnea affects breathing during sleep. It causes breathing to stop for a short time or to become shallow. It can also increase the risk of:  Heart attack.  Stroke.  Being very overweight (obese).  Diabetes.  Heart failure.  Irregular heartbeat. The goal of treatment is to help you breathe normally again. What are the  causes? There are three kinds of sleep apnea:  Obstructive sleep apnea. This is caused by a blocked or collapsed airway.  Central sleep apnea. This happens when the brain does not send the right signals to the muscles that control breathing.  Mixed sleep apnea. This is a combination of obstructive and central sleep apnea. The most common cause of this condition is a collapsed or blocked airway. This can happen if:  Your throat muscles are too relaxed.  Your tongue and tonsils are too large.  You are overweight.  Your airway is too small. What increases the risk?  Being overweight.  Smoking.  Having a small airway.  Being older.  Being male.  Drinking alcohol.  Taking medicines to calm yourself (sedatives or tranquilizers).  Having family members with the condition. What are the signs or symptoms?  Trouble staying asleep.  Being sleepy or tired during the day.  Getting angry a lot.  Loud snoring.  Headaches in the morning.  Not being able to focus your mind (concentrate).  Forgetting things.  Less interest in sex.  Mood swings.  Personality changes.  Feelings of sadness (depression).  Waking up a lot during the night to pee (urinate).  Dry mouth.  Sore throat. How  is this diagnosed?  Your medical history.  A physical exam.  A test that is done when you are sleeping (sleep study). The test is most often done in a sleep lab but may also be done at home. How is this treated?   Sleeping on your side.  Using a medicine to get rid of mucus in your nose (decongestant).  Avoiding the use of alcohol, medicines to help you relax, or certain pain medicines (narcotics).  Losing weight, if needed.  Changing your diet.  Not smoking.  Using a machine to open your airway while you sleep, such as: ? An oral appliance. This is a mouthpiece that shifts your lower jaw forward. ? A CPAP device. This device blows air through a mask when you breathe out  (exhale). ? An EPAP device. This has valves that you put in each nostril. ? A BPAP device. This device blows air through a mask when you breathe in (inhale) and breathe out.  Having surgery if other treatments do not work. It is important to get treatment for sleep apnea. Without treatment, it can lead to:  High blood pressure.  Coronary artery disease.  In men, not being able to have an erection (impotence).  Reduced thinking ability. Follow these instructions at home: Lifestyle  Make changes that your doctor recommends.  Eat a healthy diet.  Lose weight if needed.  Avoid alcohol, medicines to help you relax, and some pain medicines.  Do not use any products that contain nicotine or tobacco, such as cigarettes, e-cigarettes, and chewing tobacco. If you need help quitting, ask your doctor. General instructions  Take over-the-counter and prescription medicines only as told by your doctor.  If you were given a machine to use while you sleep, use it only as told by your doctor.  If you are having surgery, make sure to tell your doctor you have sleep apnea. You may need to bring your device with you.  Keep all follow-up visits as told by your doctor. This is important. Contact a doctor if:  The machine that you were given to use during sleep bothers you or does not seem to be working.  You do not get better.  You get worse. Get help right away if:  Your chest hurts.  You have trouble breathing in enough air.  You have an uncomfortable feeling in your back, arms, or stomach.  You have trouble talking.  One side of your body feels weak.  A part of your face is hanging down. These symptoms may be an emergency. Do not wait to see if the symptoms will go away. Get medical help right away. Call your local emergency services (911 in the [place] Do not drive yourself to the hospital. Summary  This condition affects breathing during sleep.  The most common cause is a  collapsed or blocked airway.  The goal of treatment is to help you breathe normally while you sleep. This information is not intended to replace advice given to you by your health care provider. Make sure you discuss any questions you have with your health care provider. Document Revised: 03/14/2018 Document Reviewed: 01/21/2018 Elsevier Patient Education  [place].

## 2019-08-27 ENCOUNTER — Encounter: Payer: Self-pay | Admitting: Family Medicine

## 2019-08-27 ENCOUNTER — Ambulatory Visit: Payer: Medicare HMO | Admitting: Family Medicine

## 2019-08-27 ENCOUNTER — Other Ambulatory Visit: Payer: Self-pay

## 2019-08-27 VITALS — BP 124/74 | HR 55 | Temp 96.9°F | Ht 65.0 in | Wt 152.6 lb

## 2019-08-27 DIAGNOSIS — R413 Other amnesia: Secondary | ICD-10-CM

## 2019-08-27 DIAGNOSIS — G4733 Obstructive sleep apnea (adult) (pediatric): Secondary | ICD-10-CM

## 2019-08-27 DIAGNOSIS — Z7189 Other specified counseling: Secondary | ICD-10-CM | POA: Diagnosis not present

## 2019-08-27 MED ORDER — MEMANTINE HCL 10 MG PO TABS: 10.0000 mg | ORAL_TABLET | Freq: Two times a day (BID) | ORAL | 4 refills | Status: DC

## 2019-08-27 MED ORDER — DONEPEZIL HCL 10 MG PO TABS: 10.0000 mg | ORAL_TABLET | Freq: Every day | ORAL | 4 refills | Status: DC

## 2019-09-01 NOTE — Progress Notes (Signed)
Message [facility] for new cpap orders, received.

## 2019-09-07 NOTE — Progress Notes (Signed)
I reviewed note and agree with plan.   Penni Bombard, MD [id number], [id number] PM Certified in Neurology, Neurophysiology and Neuroimaging  [facility] [address] [place], [place] [address] [phone]

## 2019-09-23 IMAGING — MR MR PROSTATE WO/W CM
56 series · 56 of 56 positions shown · IV contrast (multihance)
Comparison: None.

CLINICAL DATA: Elevated PSA equal 11.0. Prior negative prostate
biopsy 2011. Surveillance exam
TECHNIQUE: Multiplanar multisequence MRI images were obtained of the pelvis
centered about the prostate. Pre and post contrast images were
obtained.

CONTRAST:  15mL MULTIHANCE GADOBENATE DIMEGLUMINE 529 MG/ML IV SOLN

[Series 2: new loc · axial · 6.0mm · 0.88mm/px · 1 of 28 slices shown]
[im 1/28]
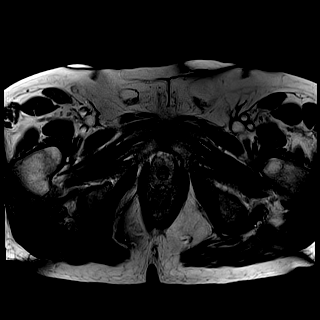

[Series 3: T1 · axial · 8.0mm · 1.06mm/px · 1 of 28 slices shown (1 of 2)]
[im 1/28]
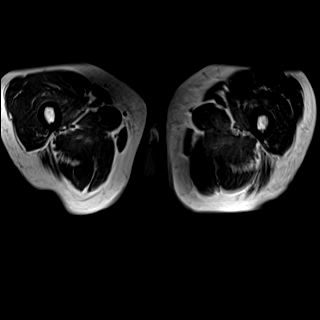

[Series 4: bSSFP fat-sat · axial · 8.0mm · 0.74mm/px · 1 of 28 slices shown]
[im 1/28]
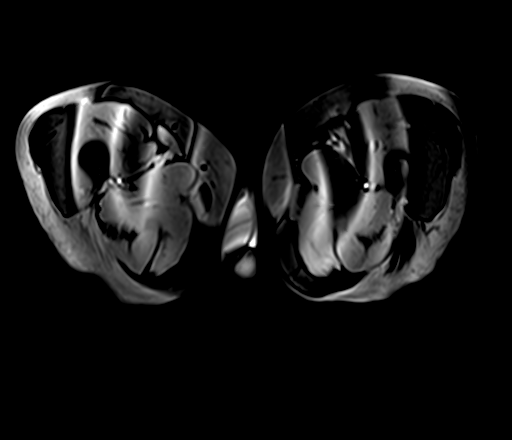

[Series 5: T2 · sagittal · 3.5mm · 0.56mm/px · 1 of 39 slices shown (1 of 4)]
[im 1/39]
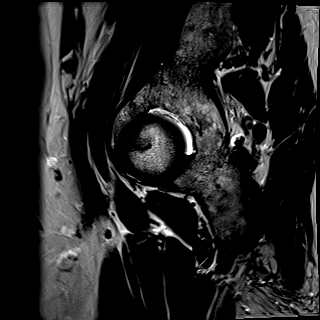

[Series 6: T1 · axial · 3.0mm · 0.31mm/px · 1 of 24 slices shown (2 of 2)]
[im 1/24]
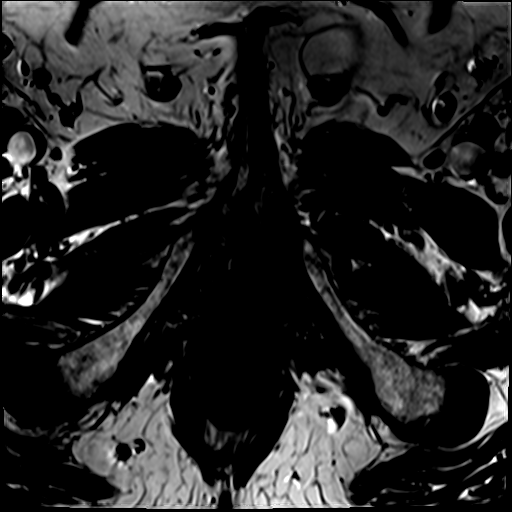

[Series 7: T2 · axial · 3.5mm · 0.56mm/px · 1 of 23 slices shown (2 of 4)]
[im 1/23]
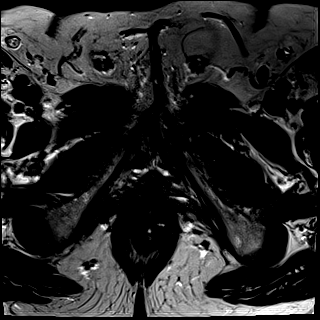

[Series 8: T2 · axial · 1.0mm · 1.04mm/px · 1 of 80 slices shown (3 of 4)]
[im 1/80]
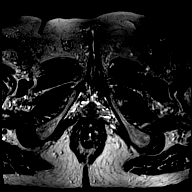

[Series 9: T2 · coronal · 3.5mm · 0.56mm/px · 1 of 23 slices shown (4 of 4)]
[im 1/23]
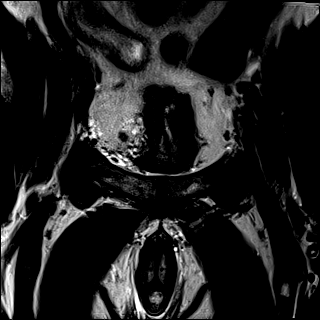

[Series 10: DWI · axial · 3.5mm · 1.56mm/px · 1 of 66 slices shown (1 of 2)]
[im 1/66]
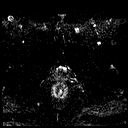

[Series 11: DWI · axial · 3.5mm · 1.56mm/px · 1 of 22 slices shown (2 of 2)]
[im 1/22]
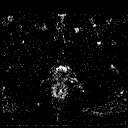

[Series 12: pre t1_twist_tra_dyn_ttc=5.6s · axial · non-contrast · 3.5mm · 0.83mm/px · 1 of 22 slices shown]
[im 1/22]
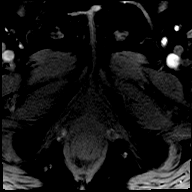

[Series 13: post t1_twist_tra_dyn-copy center · axial · 3.5mm · 0.83mm/px · 1 of 22 slices shown (1 of 23)]
[im 1/22]
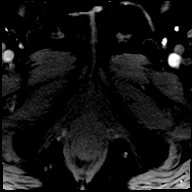

[Series 14: post t1_twist_tra_dyn-copy center · axial · 3.5mm · 0.83mm/px · 1 of 22 slices shown (2 of 23)]
[im 1/22]
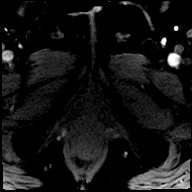

[Series 15: post t1_twist_tra_dyn-copy cent_sub_ttc=(id) · axial · 3.5mm · 0.83mm/px · 1 of 22 slices shown (1 of 22)]
[im 1/22]
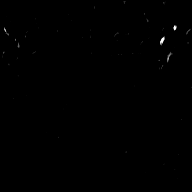

[Series 16: post t1_twist_tra_dyn-copy center · axial · 3.5mm · 0.83mm/px · 1 of 22 slices shown (3 of 23)]
[im 1/22]
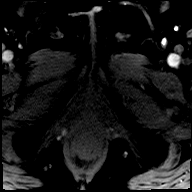

[Series 17: post t1_twist_tra_dyn-copy cent_sub_ttc=(id) · axial · 3.5mm · 0.83mm/px · 1 of 22 slices shown (2 of 22)]
[im 1/22]
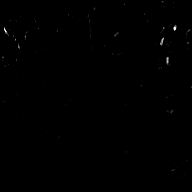

[Series 18: post t1_twist_tra_dyn-copy center · axial · 3.5mm · 0.83mm/px · 1 of 22 slices shown (4 of 23)]
[im 1/22]
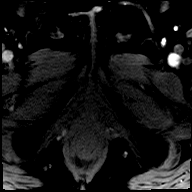

[Series 19: post t1_twist_tra_dyn-copy cent_sub_ttc=(id) · axial · 3.5mm · 0.83mm/px · 1 of 22 slices shown (3 of 22)]
[im 1/22]
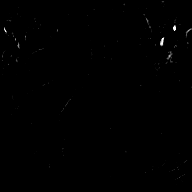

[Series 20: post t1_twist_tra_dyn-copy center · axial · 3.5mm · 0.83mm/px · 1 of 22 slices shown (5 of 23)]
[im 1/22]
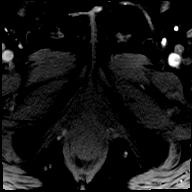

[Series 21: post t1_twist_tra_dyn-copy cent_sub_ttc=(id) · axial · 3.5mm · 0.83mm/px · 1 of 22 slices shown (4 of 22)]
[im 1/22]
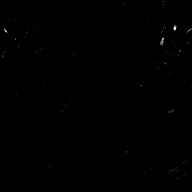

[Series 22: post t1_twist_tra_dyn-copy center · axial · 3.5mm · 0.83mm/px · 1 of 22 slices shown (6 of 23)]
[im 1/22]
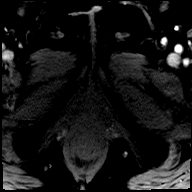

[Series 23: post t1_twist_tra_dyn-copy cent_sub_ttc=(id) · axial · 3.5mm · 0.83mm/px · 1 of 22 slices shown (5 of 22)]
[im 1/22]
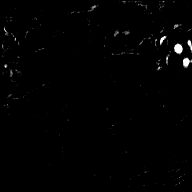

[Series 24: post t1_twist_tra_dyn-copy center · axial · 3.5mm · 0.83mm/px · 1 of 22 slices shown (7 of 23)]
[im 1/22]
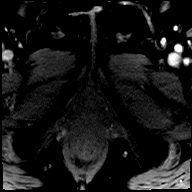

[Series 25: post t1_twist_tra_dyn-copy cent_sub_ttc=(id) · axial · 3.5mm · 0.83mm/px · 1 of 22 slices shown (6 of 22)]
[im 1/22]
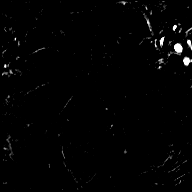

[Series 26: post t1_twist_tra_dyn-copy center · axial · 3.5mm · 0.83mm/px · 1 of 22 slices shown (8 of 23)]
[im 1/22]
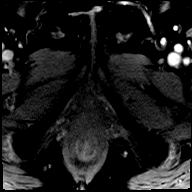

[Series 27: post t1_twist_tra_dyn-copy cent_sub_ttc=(id) · axial · 3.5mm · 0.83mm/px · 1 of 22 slices shown (7 of 22)]
[im 1/22]
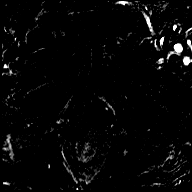

[Series 28: post t1_twist_tra_dyn-copy center · axial · 3.5mm · 0.83mm/px · 1 of 22 slices shown (9 of 23)]
[im 1/22]
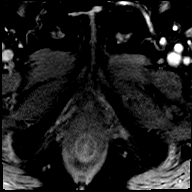

[Series 29: post t1_twist_tra_dyn-copy cent_sub_ttc=(id) · axial · 3.5mm · 0.83mm/px · 1 of 22 slices shown (8 of 22)]
[im 1/22]
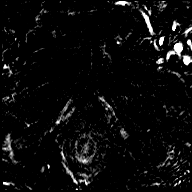

[Series 30: post t1_twist_tra_dyn-copy center · axial · 3.5mm · 0.83mm/px · 1 of 22 slices shown (10 of 23)]
[im 1/22]
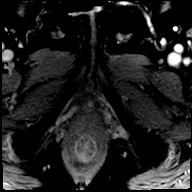

[Series 31: post t1_twist_tra_dyn-copy cent_sub_ttc=(id) · axial · 3.5mm · 0.83mm/px · 1 of 22 slices shown (9 of 22)]
[im 1/22]
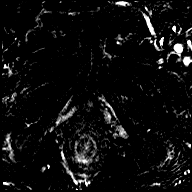

[Series 32: post t1_twist_tra_dyn-copy center · axial · 3.5mm · 0.83mm/px · 1 of 22 slices shown (11 of 23)]
[im 1/22]
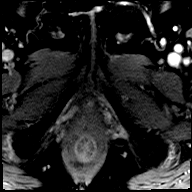

[Series 33: post t1_twist_tra_dyn-copy cent_sub_ttc=(id) · axial · 3.5mm · 0.83mm/px · 1 of 22 slices shown (10 of 22)]
[im 1/22]
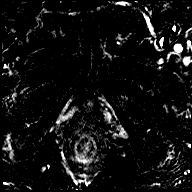

[Series 34: post t1_twist_tra_dyn-copy center · axial · 3.5mm · 0.83mm/px · 1 of 22 slices shown (12 of 23)]
[im 1/22]
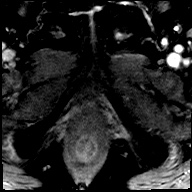

[Series 35: post t1_twist_tra_dyn-copy cent_sub_ttc=(id) · axial · 3.5mm · 0.83mm/px · 1 of 22 slices shown (11 of 22)]
[im 1/22]
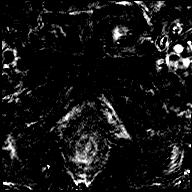

[Series 36: post t1_twist_tra_dyn-copy center · axial · 3.5mm · 0.83mm/px · 1 of 22 slices shown (13 of 23)]
[im 1/22]
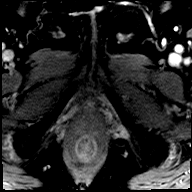

[Series 37: post t1_twist_tra_dyn-copy cent_sub_ttc=(id) · axial · 3.5mm · 0.83mm/px · 1 of 22 slices shown (12 of 22)]
[im 1/22]
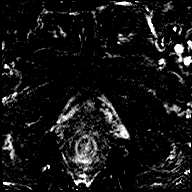

[Series 38: post t1_twist_tra_dyn-copy center · axial · 3.5mm · 0.83mm/px · 1 of 22 slices shown (14 of 23)]
[im 1/22]
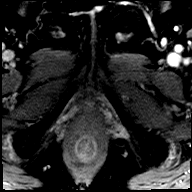

[Series 39: post t1_twist_tra_dyn-copy cent_sub_ttc=(id) · axial · 3.5mm · 0.83mm/px · 1 of 22 slices shown (13 of 22)]
[im 1/22]
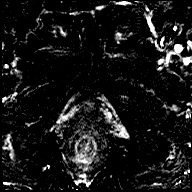

[Series 40: post t1_twist_tra_dyn-copy center · axial · 3.5mm · 0.83mm/px · 1 of 22 slices shown (15 of 23)]
[im 1/22]
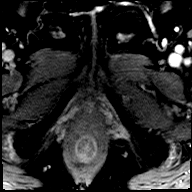

[Series 41: post t1_twist_tra_dyn-copy cent_sub_ttc=(id) · axial · 3.5mm · 0.83mm/px · 1 of 22 slices shown (14 of 22)]
[im 1/22]
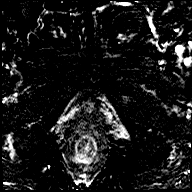

[Series 42: post t1_twist_tra_dyn-copy center · axial · 3.5mm · 0.83mm/px · 1 of 22 slices shown (16 of 23)]
[im 1/22]
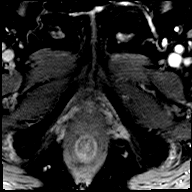

[Series 43: post t1_twist_tra_dyn-copy cent_sub_ttc=(id) · axial · 3.5mm · 0.83mm/px · 1 of 22 slices shown (15 of 22)]
[im 1/22]
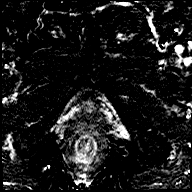

[Series 44: post t1_twist_tra_dyn-copy center · axial · 3.5mm · 0.83mm/px · 1 of 22 slices shown (17 of 23)]
[im 1/22]
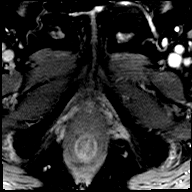

[Series 45: post t1_twist_tra_dyn-copy cent_sub_ttc=(id) · axial · 3.5mm · 0.83mm/px · 1 of 22 slices shown (16 of 22)]
[im 1/22]
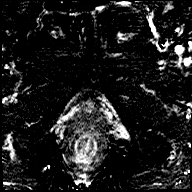

[Series 46: post t1_twist_tra_dyn-copy center · axial · 3.5mm · 0.83mm/px · 1 of 22 slices shown (18 of 23)]
[im 1/22]
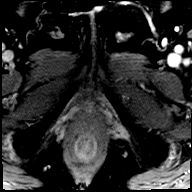

[Series 47: post t1_twist_tra_dyn-copy cent_sub_ttc=(id) · axial · 3.5mm · 0.83mm/px · 1 of 22 slices shown (17 of 22)]
[im 1/22]
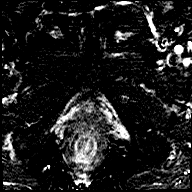

[Series 48: post t1_twist_tra_dyn-copy center · axial · 3.5mm · 0.83mm/px · 1 of 22 slices shown (19 of 23)]
[im 1/22]
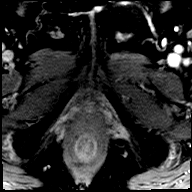

[Series 49: post t1_twist_tra_dyn-copy cent_sub_ttc=(id) · axial · 3.5mm · 0.83mm/px · 1 of 22 slices shown (18 of 22)]
[im 1/22]
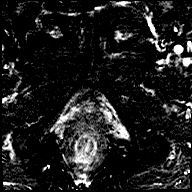

[Series 50: post t1_twist_tra_dyn-copy center · axial · 3.5mm · 0.83mm/px · 1 of 22 slices shown (20 of 23)]
[im 1/22]
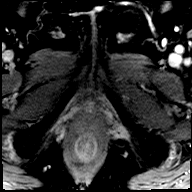

[Series 51: post t1_twist_tra_dyn-copy cent_sub_ttc=(id) · axial · 3.5mm · 0.83mm/px · 1 of 22 slices shown (19 of 22)]
[im 1/22]
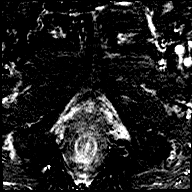

[Series 52: post t1_twist_tra_dyn-copy center · axial · 3.5mm · 0.83mm/px · 1 of 22 slices shown (21 of 23)]
[im 1/22]
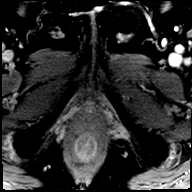

[Series 53: post t1_twist_tra_dyn-copy cent_sub_ttc=(id) · axial · 3.5mm · 0.83mm/px · 1 of 22 slices shown (20 of 22)]
[im 1/22]
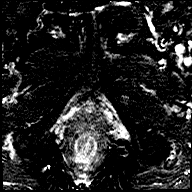

[Series 54: post t1_twist_tra_dyn-copy center · axial · 3.5mm · 0.83mm/px · 1 of 22 slices shown (22 of 23)]
[im 1/22]
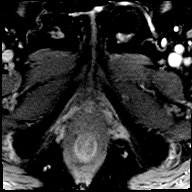

[Series 55: post t1_twist_tra_dyn-copy cent_sub_ttc=(id) · axial · 3.5mm · 0.83mm/px · 1 of 22 slices shown (21 of 22)]
[im 1/22]
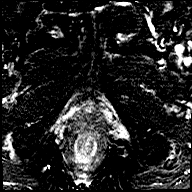

[Series 56: post t1_twist_tra_dyn-copy center · axial · 3.5mm · 0.83mm/px · 1 of 22 slices shown (23 of 23)]
[im 1/22]
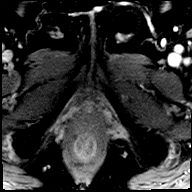

[Series 57: post t1_twist_tra_dyn-copy cent_sub_ttc=(id) · axial · 3.5mm · 0.83mm/px · 1 of 22 slices shown (22 of 22)]
[im 1/22]
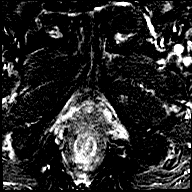

[56 of 56 positions shown; findings below may reference images not displayed]

MR Prostate w/wo [EMAIL] BUN=22 CREAT=1.0 GFR=73 15mL MULTIHANCE
Elevated PSA 11.10 08/14/17. Prev bx [DATE] was neg. PSA and BX
results under media labeled as order/psa/bx results. No surgery or
treatment. No other hx cancer. Silverio^15mL MULTIHANCE GADOBENATE
DIMEGLUMINE 529 MG/ML IV SOLN

EXAM:
MR PROSTATE WITHOUT AND WITH CONTRAST
FINDINGS: Prostate: No focal lesion within the peripheral zone on T2 weighted
imaging. There is a somewhat smudgy intensity within the peripheral
zone. On the diffusion-weighted imaging there is no discrete foci of
restricted diffusion within the peripheral zone.

The transitional zone is nodular with what appears to be well
encapsulated nodules with heterogeneous signal intensity which is
typical of benign nodules.

The prostatic capsule is intact. The recto prostatic angles are
sharp..

Seminal vesicles are normal.

Volume: 4.9 x 3.9 by 4.6 cm (volume = 46 cm^3)

Transcapsular spread:  Absent

Seminal vesicle involvement: Absent

Neurovascular bundle involvement: Absent

Pelvic adenopathy: Absent

Bone metastasis: Absent

Other findings: Bilateral fat filled inguinal hernias.
IMPRESSION: 1. No evidence of high-grade carcinoma within the peripheral zone
(PI-RADS 1)
2. Mildly enlarged nodular transitional zone consistent with
prostatic hypertrophy

## 2019-09-24 ENCOUNTER — Other Ambulatory Visit: Payer: Self-pay

## 2019-09-24 DIAGNOSIS — E782 Mixed hyperlipidemia: Secondary | ICD-10-CM

## 2019-09-24 DIAGNOSIS — I10 Essential (primary) hypertension: Secondary | ICD-10-CM

## 2019-09-24 DIAGNOSIS — Z79899 Other long term (current) drug therapy: Secondary | ICD-10-CM

## 2019-09-24 NOTE — Progress Notes (Signed)
Pt walked in office to have labs completed with wife. Order placed but pt did not have time to wait to complete at this time. Left detailed message okay for pt to come by office fasting to get labs completed or wait for next appt.

## 2019-09-28 LAB — COMPREHENSIVE METABOLIC PANEL
ALT: 33 IU/L (ref 0–44)
AST: 24 IU/L (ref 0–40)
Albumin/Globulin Ratio: 2.2 (ref 1.2–2.2)
Albumin: 4.6 g/dL (ref 3.7–4.7)
Alkaline Phosphatase: 72 IU/L (ref 39–117)
BUN/Creatinine Ratio: 31 — ABNORMAL HIGH (ref 10–24)
BUN: 31 mg/dL — ABNORMAL HIGH (ref 8–27)
Bilirubin Total: 0.3 mg/dL (ref 0.0–1.2)
CO2: 22 mmol/L (ref 20–29)
Calcium: 9.8 mg/dL (ref 8.6–10.2)
Chloride: 108 mmol/L — ABNORMAL HIGH (ref 96–106)
Creatinine, Ser: 0.99 mg/dL (ref 0.76–1.27)
GFR calc Af Amer: 85 mL/min/{1.73_m2} (ref >59)
GFR calc non Af Amer: 73 mL/min/{1.73_m2} (ref >59)
Globulin, Total: 2.1 g/dL (ref 1.5–4.5)
Glucose: 110 mg/dL — ABNORMAL HIGH (ref 65–99)
Potassium: 5.1 mmol/L (ref 3.5–5.2)
Sodium: 144 mmol/L (ref 134–144)
Total Protein: 6.7 g/dL (ref 6.0–8.5)

## 2019-09-28 LAB — LIPID PANEL
Chol/HDL Ratio: 3.6 ratio (ref 0.0–5.0)
Cholesterol, Total: 187 mg/dL (ref 100–199)
HDL: 52 mg/dL (ref >39)
LDL Chol Calc (NIH): 122 mg/dL — ABNORMAL HIGH (ref 0–99)
Triglycerides: 72 mg/dL (ref 0–149)
VLDL Cholesterol Cal: 13 mg/dL (ref 5–40)

## 2019-09-29 ENCOUNTER — Telehealth (INDEPENDENT_AMBULATORY_CARE_PROVIDER_SITE_OTHER): Payer: Medicare HMO | Admitting: Cardiovascular Disease

## 2019-09-29 VITALS — Ht 65.0 in | Wt 149.0 lb

## 2019-09-29 DIAGNOSIS — R413 Other amnesia: Secondary | ICD-10-CM

## 2019-09-29 DIAGNOSIS — I10 Essential (primary) hypertension: Secondary | ICD-10-CM | POA: Diagnosis not present

## 2019-09-29 DIAGNOSIS — Z9981 Dependence on supplemental oxygen: Secondary | ICD-10-CM

## 2019-09-29 DIAGNOSIS — G4733 Obstructive sleep apnea (adult) (pediatric): Secondary | ICD-10-CM | POA: Diagnosis not present

## 2019-09-29 DIAGNOSIS — E785 Hyperlipidemia, unspecified: Secondary | ICD-10-CM

## 2019-09-29 DIAGNOSIS — E782 Mixed hyperlipidemia: Secondary | ICD-10-CM

## 2019-09-29 DIAGNOSIS — Z8616 Personal history of COVID-19: Secondary | ICD-10-CM

## 2019-09-29 MED ORDER — OMEGA-3-ACID ETHYL ESTERS 1 G PO CAPS: 1.0000 g | ORAL_CAPSULE | Freq: Every day | ORAL | 3 refills | Status: DC

## 2019-09-29 MED ORDER — EZETIMIBE 10 MG PO TABS: 10.0000 mg | ORAL_TABLET | Freq: Every day | ORAL | 3 refills | Status: DC

## 2019-09-29 NOTE — Progress Notes (Signed)
Virtual Visit via Telephone Note   This visit type was conducted due to national recommendations for restrictions regarding the COVID-19 Pandemic (e.g. social distancing) in an effort to limit this patient's exposure and mitigate transmission in our community.  Due to his co-morbid illnesses, this patient is at least at moderate risk for complications without adequate follow up.  This format is felt to be most appropriate for this patient at this time.  The patient did not have access to video technology/had technical difficulties with video requiring transitioning to audio format only (telephone).  All issues noted in this document were discussed and addressed.  No physical exam could be performed with this format.  Please refer to the patient's chart for his  consent to telehealth for [facility]   The patient was identified using 2 identifiers.  Date:  10/06/2019   ID:  Carlos Pierce, DOB Nov 29, 1940, MRN [id number]  Patient Location: Home Provider Location: Office  PCP:  Stephens Shire, MD  Cardiologist:  Shelva Majestic, MD Electrophysiologist:  None   Evaluation Performed:  Follow-Up Visit  Chief Complaint:  6 month  History of Present Illness:    Carlos Pierce is a 79 y.o. male who has a history of mild hypertension and hyperlipidemia. Remotely, he has had mild elevation of PSAs which have been followed by Drs [name] and more recently Dr. [name].  He has had mild transaminase elevation in the past on Crestor.  In 2013 laboratory revealed  a total cholesterol 213 triglycerides 136 HDL 46 LDL 140. PSA was 3.19. Normal LFTs.  He has tolerated  Livalo 2 mg in addition to Zetia 10 mg. Repeat blood work on 01/12/2013 showed a total cholesterol 144 HDL 55 LDL 70 VLDL 19 triglycerides 96. He is tolerating the medicine well without side effects. Liver function studies have remained normal; PSA  decreased at 2.61. He remains active and exercises daily. Over the past  6 years he has lost 15-20 pounds as a result of his activity and improve diet.  He has continued to exercise at least  5 days per week.  He exercises on a stationary bike for over an hour for approximately 18 miles 3 days per week and on alternating days does low resistance training.  He had undergone a colonoscopy by Dr. [name].He denies chest pain PND or orthopnea. He denies palpitations. He denies myalgias.  He had experienced one episode where he said up very abruptly and did have transient dizziness where he stumbled.  He has not had recurrence of this.  He denied associated palpitations.  He denied vertigo.  In December 2016 lipid studies were excellent with a total cholesterol 141, triglycerides 96, HDL 47 and LDL 75.  PSA was 3.54.   In 06/29/2016  CBC was normal.  PSA was 2.7.  BUN27, creatinine 1.17.  LFTs were normal.  Lipid studies continue to be excellent with a total cholesterol of 146, triglycerides 1:30, HDL 51, and LDL 69.   Since I saw him in January 2018, he has had memory issues.  He also had some development of lower back discomfort.  He held off exercise for approximately 2 months but is now back exercising again.  He denies chest pain or shortness of breath.  He has now required hearing aids.  He underwent a sleep evaluation by the neurologist and was found to have mild sleep apnea.  Repeat blood pressure was 118/64 he uses CPAP for obstructive sleep apnea and admits to 100%  compliance.  Laboratory in January 2019 showed a total cholesterol 173, HDL 56, LDL 94 and triglycerides 130.  He was Zetia and pravastatin 40 mg.  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  He continues to be on lisinopril 5 mg for hypertension.  GERD is controlled with Zantac.  Due to concerns for memory issues and statins he ultimately was advised to discontinue pravastatin by neurology.  Since I saw him in April 2019, he has been followed by Dr. [name] and is now  donepezil 10 mg at bedtime in addition to memantine 10 mg twice a day.  His wife was wondering about potential additional evaluation elsewhere.  According to the patient not had any major improvement in memory but perhaps there is not been as rapid deterioration as there had in the past.  At times his wife has noticed that it is difficult for him to get words out.  The patient denies any chest tightness or pressure.  He denied palpitations, presyncope or syncope.    I  saw him on March 2020.  Laboratory on August 12, 2018  showed mild glucose elevation at 106.  Renal function was improved with a creatinine of 1.26.  Fair to 5 months ago lipid studies are significantly improved with total cholesterol improving from 261 to 175, triglycerides 155 to63, and LDL cholesterol from 180 to 110.  He continues to take Zetia.  He also has been taking his son's lovaza generic omega 3 fatty acid.  In retrospect, he does not believe the pravastatin contributed to any of his memory issues.    I last saw him on March 12, 2019.  Since his previous evaluation he had  contracted COVID-19 from his son and had symptoms of fever for several days, cough, loss of taste and smell.  He had taken some of his wife's doxycycline days and he believes this may have ameliorated some of his symptoms.  However he did note significant residual fatigability.  He admits to fatigue.  He continues to have difficulty losing his words and recently saw his neurologist who scheduled him for repeat sleep study which is scheduled for October 13.  He denies chest pain.  He denies palpitations.  He is not taking rosuvastatin but is taking Zetia 10 mg as well as lisinopril 5 mg daily and lovasa 1 capsule daily.   He underwent a sleep study by Dr. [name] and initially have been tried on BiPAP therapy but he cannot need to have difficulty in oxygen desaturation despite BiPAP ST at 25/20.  Presently he is on ASV therapy with an EPAP of 15 cm, pressure support  minimum of 3 maximum of 10 with 1 L of supplemental oxygen.  He is using a chinstrap.  He presently denies any chest pain or shortness of breath.  He continues to have difficulty losing words and with his memory and continues to be on Namenda and Aricept.  He denies any chest pain PND orthopnea.  Most recent laboratory has shown an LDL of 122 triglycerides of 72 total cholesterol 186 on Zetia and Vascepa.  He presents for evaluation.  Patient had COVID-19 infection in the fall 2020.  No recent symptomatology  Past Medical History:  Diagnosis Date  . Memory loss    Past Surgical History:  Procedure Laterality Date  . CATARACT EXTRACTION Bilateral   . HEMORRHOID BANDING  2016     Current Meds  Medication Sig  . Bran 500 MG TABS  Take 1 tablet by mouth 2 (two) times daily.  . Coenzyme Q10 (CO Q 10 PO) Take 300 mg by mouth daily.  Marland Kitchen donepezil (ARICEPT) 10 MG tablet Take 1 tablet (10 mg total) by mouth at bedtime.  Marland Kitchen ezetimibe (ZETIA) 10 MG tablet Take 1 tablet (10 mg total) by mouth daily.  . famotidine (PEPCID) 20 MG tablet Take 20 mg by mouth daily.   . Ginkgo Biloba 40 MG TABS Take 240 mg by mouth daily.  Marland Kitchen L-Tyrosine 500 MG CAPS Take 1 capsule by mouth daily.  Marland Kitchen lisinopril (ZESTRIL) 5 MG tablet Take 0.5 tablets (2.5 mg total) by mouth daily.  . memantine (NAMENDA) 10 MG tablet Take 1 tablet (10 mg total) by mouth 2 (two) times daily.  . Multiple Vitamins-Minerals (MULTIVITAMIN & MINERAL PO) Take 1 tablet by mouth daily.  Marland Kitchen omega-3 acid ethyl esters (LOVAZA) 1 g capsule Take 1 capsule (1 g total) by mouth daily.  . [DISCONTINUED] ezetimibe (ZETIA) 10 MG tablet Take 1 tablet (10 mg total) by mouth daily.  . [DISCONTINUED] Milk Thistle 1000 MG CAPS Take 1 capsule by mouth daily.  . [DISCONTINUED] omega-3 acid ethyl esters (LOVAZA) 1 g capsule Take 1 capsule (1 g total) by mouth daily.     Allergies:   Patient has no known allergies.   Social History   Tobacco Use  . Smoking status:  Never Smoker  . Smokeless tobacco: Never Used  Substance Use Topics  . Alcohol use: Yes    Alcohol/week: 2.0 standard drinks    Types: 2 Standard drinks or equivalent per week    Comment: wine a few days a week  . Drug use: No     Family Hx: The patient's family history is not on file.  ROS:   Please see the history of present illness.    No recent fevers chills or night sweats No chest pain No palpitation No swelling Complex sleep apnea now on ASV with supplemental oxygen Continued memory loss on Namenda and Aricept All other systems reviewed and are negative.   Prior CV studies:   The following studies were reviewed today:  I personally reviewed the patient's leap study for complex sleep apnea and evaluation by Dr. [name]  Labs/Other Tests and Data Reviewed:    EKG: Personally reviewed his ECG from March 20, 2019 which shows normal sinus rhythm with mild sinus arrhythmia at 65 bpm.  No ectopy.  Normal intervals.  Recent Labs: 03/09/2019: Hemoglobin 12.8; Platelets 168; TSH 3.600 09/28/2019: ALT 33; BUN 31; Creatinine, Ser 0.99; Potassium 5.1; Sodium 144   Recent Lipid Panel Lab Results  Component Value Date/Time   CHOL 187 09/28/2019 10:24 AM   TRIG 72 09/28/2019 10:24 AM   HDL 52 09/28/2019 10:24 AM   CHOLHDL 3.6 09/28/2019 10:24 AM   CHOLHDL 3.1 07/08/2017 08:03 AM   LDLCALC 122 (H) 09/28/2019 10:24 AM   LDLCALC 94 07/08/2017 08:03 AM    Wt Readings from Last 3 Encounters:  09/29/19 149 lb (67.6 kg)  08/27/19 152 lb 9.6 oz (69.2 kg)  03/12/19 147 lb (66.7 kg)     Objective:    Vital Signs:  Ht 5\' 5"  (1.651 m)   Wt 149 lb (67.6 kg)   BMI 24.79 kg/m    Since this was a virtual telemedicine visit I could not physically examine the patient. Breathing was normal and not labored There was no audible wheezing He was not short of breath Pulse apparently showed a regular rhythm  There was no chest wall or abdominal discomfort to palpation No  edema  ASSESSMENT & PLAN:    1. Essential hypertension: Has been on lisinopril.  Unable to obtain a blood pressure today since he did not have a cuff available in this telemedicine visit. 2. Hyperlipidemia: Recent lipid studies reviewed remotely he was on Zetia and pravastatin but pravastatin was discontinued by neurology due to potential concerns for his memory decline.  Vascepa was added to his regimen.  If LDL still elevated may be a potential candidate for bempedoic acid. 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 [name] and also requiring supplemental oxygenation. 4. Memory issues: Continues to be on Namenda and Aricept 5. History of COVID-19 virus  COVID-19 Education: The signs and symptoms of COVID-19 were discussed with the patient and how to seek care for testing (follow up with PCP or arrange E-visit).  The importance of social distancing was discussed today.  Time:   Today, I have spent 16 minutes with the patient with telehealth technology discussing the above problems.     Medication Adjustments/Labs and Tests Ordered: Current medicines are reviewed at length with the patient today.  Concerns regarding medicines are outlined above.   Tests Ordered: No orders of the defined types were placed in this encounter.   Medication Changes: Meds ordered this encounter  Medications  . ezetimibe (ZETIA) 10 MG tablet    Sig: Take 1 tablet (10 mg total) by mouth daily.    Dispense:  90 tablet    Refill:  3  . omega-3 acid ethyl esters (LOVAZA) 1 g capsule    Sig: Take 1 capsule (1 g total) by mouth daily.    Dispense:  90 capsule    Refill:  3    Follow Up: 6 months in office visit  Signed, Shelva Majestic, MD  10/06/2019 4:39 PM    Carlos Pierce

## 2019-09-29 NOTE — Patient Instructions (Signed)

## 2019-10-06 ENCOUNTER — Encounter: Payer: Self-pay | Admitting: Cardiovascular Disease

## 2020-01-07 ENCOUNTER — Ambulatory Visit: Payer: Medicare HMO | Admitting: Family Medicine

## 2020-03-30 NOTE — Progress Notes (Signed)
No chief complaint on file.    HISTORY OF PRESENT ILLNESS: Today 03/31/20  Carlos Pierce is a 79 y.o. male here today for follow up for OSA on ASV and memory loss. He continues Aricept and Namenda.   Compliance report dated 02/29/2020 through 03/29/2020 reveals that he used ASV 30 of the past 30 days for compliance of 100%.  He used ASV greater than 4 hours 27 of the past 30 days for compliance of 90%.  Average usage was 7 hours and 16 minutes residual AHI was 2.3 with a EPAP of 15 cm of water, minimum pressure support 3 cm of water and maximum pressure support of 10 cm of water.  There was a leak noted in the 95th percentile at 33 L/min.  HISTORY (copied from my note on 08/27/2019)  Carlos Pierce is a 79 y.o. male here today for follow up for dementia and OSA recently started on ASV. AHI remained elevated on CPAP and titration study showed resolution with ASV with O2 at 1L. He returns today for compliance review.  He reports that he is doing fairly well with therapy.  He does mention concerns of not having a chinstrap.  When fitted in the office during his titration study, he did well with a small Quatro full facemask and chinstrap.  He is uncertain of what mask he is using now.  He has noted an increased leak when he sleeps on his side.  He had a.  Of time where he fell and was unable to use his machine for about a week.  He reports slipping after stepping down from the fireplace.  He denies any difficulty with recurrent falls or changes in gait. He continues Namenda 10mg  BID and Aricept 10mg  QHS. He feels that memory is fairly stable. He has good and bad days.  He is able to perform all ADLs independently.  He is driving without difficulty.  He is able to manage finances and take medications independently.  Compliance report dated 06/27/2019 through 08/25/2019 reveals that he has used ASV therapy 44 of the past 60 days for compliance of 73%.  He has used ASV therapy greater  than 4 hours 40 of the past 60 days for compliance of 67%.  Average usage on days used was 6 hours and 11 minutes.  Residual AHI was 4.0 with a EPAP of 15 cm of water, minimum pressure support of 3 cm of water and maximum pressure support of 10 cm of water.  There was a significant leak noted in the 95th percentile of 77.4 L/min.   REVIEW OF SYSTEMS: Out of a complete 14 system review of symptoms, the patient complains only of the following symptoms, and all other reviewed systems are negative.   ALLERGIES: No Known Allergies   HOME MEDICATIONS: Outpatient Medications Prior to Visit  Medication Sig Dispense Refill  . ACETYLCYSTEINE PO Take by mouth.    Marland Kitchen BRAN FIBER PO Take by mouth.    . Coenzyme Q10 (CO Q 10 PO) Take 300 mg by mouth daily.    Marland Kitchen ezetimibe (ZETIA) 10 MG tablet Take 1 tablet (10 mg total) by mouth daily. 90 tablet 3  . famotidine (PEPCID) 20 MG tablet Take 20 mg by mouth daily.     . Ginkgo Biloba 40 MG TABS Take 240 mg by mouth daily.    Marland Kitchen L-Tyrosine 500 MG CAPS Take 1 capsule by mouth daily.    Marland Kitchen lisinopril (ZESTRIL) 5 MG tablet Take 0.5  tablets (2.5 mg total) by mouth daily. 90 tablet 0  . Multiple Vitamins-Minerals (MULTIVITAMIN & MINERAL PO) Take 1 tablet by mouth daily.    Marland Kitchen omega-3 acid ethyl esters (LOVAZA) 1 g capsule Take 1 capsule (1 g total) by mouth daily. 90 capsule 3  . donepezil (ARICEPT) 10 MG tablet Take 1 tablet (10 mg total) by mouth at bedtime. 90 tablet 4  . memantine (NAMENDA) 10 MG tablet Take 1 tablet (10 mg total) by mouth 2 (two) times daily. 180 tablet 4  . Bran 500 MG TABS Take 1 tablet by mouth 2 (two) times daily.     No facility-administered medications prior to visit.     PAST MEDICAL HISTORY: Past Medical History:  Diagnosis Date  . Memory loss      PAST SURGICAL HISTORY: Past Surgical History:  Procedure Laterality Date  . CATARACT EXTRACTION Bilateral   . HEMORRHOID BANDING  2016     FAMILY HISTORY: No family history  on file.   SOCIAL HISTORY: Social History   Socioeconomic History  . Marital status: Married    Spouse name: [name]  . Number of children: 1  . Years of education: 78  . Highest education level: Not on file  Occupational History    Comment: [employer]  Tobacco Use  . Smoking status: Never Smoker  . Smokeless tobacco: Never Used  Substance and Sexual Activity  . Alcohol use: Yes    Alcohol/week: 2.0 standard drinks    Types: 2 Standard drinks or equivalent per week    Comment: wine a few days a week  . Drug use: No  . Sexual activity: Not on file  Other Topics Concern  . Not on file  Social History Narrative   Drinks 1 cup of coffee a day    Social Determinants of Health   Financial Resource Strain:   . Difficulty of Paying Living Expenses: Not on file  Food Insecurity:   . Worried About Charity fundraiser in the Last Year: Not on file  . Ran Out of Food in the Last Year: Not on file  Transportation Needs:   . Lack of Transportation (Medical): Not on file  . Lack of Transportation (Non-Medical): Not on file  Physical Activity:   . Days of Exercise per Week: Not on file  . Minutes of Exercise per Session: Not on file  Stress:   . Feeling of Stress : Not on file  Social Connections:   . Frequency of Communication with Friends and Family: Not on file  . Frequency of Social Gatherings with Friends and Family: Not on file  . Attends Religious Services: Not on file  . Active Member of Clubs or Organizations: Not on file  . Attends Archivist Meetings: Not on file  . Marital Status: Not on file  Intimate Partner Violence:   . Fear of Current or Ex-Partner: Not on file  . Emotionally Abused: Not on file  . Physically Abused: Not on file  . Sexually Abused: Not on file      PHYSICAL EXAM  Vitals:   03/31/20 0849  BP: 130/74  Pulse: 63  Weight: 152 lb (68.9 kg)  Height: 5\' 3"  (1.6 m)   Body mass index is 26.93 kg/m.   Generalized:  Well developed, in no acute distress   Neurological examination  Mentation: Alert oriented to time, place, history taking. Follows all commands speech and language fluent Cranial nerve II-XII: Pupils were equal round  reactive to light. Extraocular movements were full, visual field were full  Motor: The motor testing reveals 5 over 5 strength of all 4 extremities. Good symmetric motor tone is noted throughout.  Gait and station: Gait is normal.     DIAGNOSTIC DATA (LABS, IMAGING, TESTING) - I reviewed patient records, labs, notes, testing and imaging myself where available.  Lab Results  Component Value Date   WBC 6.7 03/09/2019   HGB 12.8 (L) 03/09/2019   HCT 38.7 03/09/2019   MCV 92 03/09/2019   PLT 168 03/09/2019      Component Value Date/Time   NA 144 09/28/2019 1024   K 5.1 09/28/2019 1024   CL 108 (H) 09/28/2019 1024   CO2 22 09/28/2019 1024   GLUCOSE 110 (H) 09/28/2019 1024   GLUCOSE 103 (H) 06/29/2016 1021   BUN 31 (H) 09/28/2019 1024   CREATININE 0.99 09/28/2019 1024   CREATININE 1.17 06/29/2016 1021   CALCIUM 9.8 09/28/2019 1024   PROT 6.7 09/28/2019 1024   ALBUMIN 4.6 09/28/2019 1024   AST 24 09/28/2019 1024   ALT 33 09/28/2019 1024   ALKPHOS 72 09/28/2019 1024   BILITOT 0.3 09/28/2019 1024   GFRNONAA 73 09/28/2019 1024   GFRAA 85 09/28/2019 1024   Lab Results  Component Value Date   CHOL 187 09/28/2019   HDL 52 09/28/2019   LDLCALC 122 (H) 09/28/2019   TRIG 72 09/28/2019   CHOLHDL 3.6 09/28/2019   No results found for: HGBA1C Lab Results  Component Value Date   VITAMINB12 815 07/23/2016   Lab Results  Component Value Date   TSH 3.600 03/09/2019   MMSE - Mini Mental State Exam 03/31/2020 08/27/2019 11/26/2017  Orientation to time 2 4 3   Orientation to Place 2 3 3   Registration 3 3 3   Attention/ Calculation 4 4 5   Attention/Calculation-comments did WORLD )spelled DLOW - -  Recall 2 1 0  Language- name 2 objects 2 2 2   Language- repeat 0 0 1   Language- follow 3 step command 3 3 3   Language- read & follow direction 1 1 1   Write a sentence 1 1 1   Copy design 0 1 0  Total score 20 23 22      ASSESSMENT AND PLAN  79 y.o. year old male  has a past medical history of Memory loss. here with   OSA (obstructive sleep apnea) - Plan: For home use only DME B[facility]  ASV (adaptive servo-ventilation) use counseling - Plan: For home use only DME Bipap  Memory loss - Plan: donepezil (ARICEPT) 10 MG tablet, memantine (NAMENDA) 10 MG tablet   Dontario is doing well, today.  Compliance report reveals excellent compliance.  Leak has significantly improved. He is sleeping much better. AHI now 2.  Memory is stable.  He will continue Aricept and Namenda as prescribed.  He was encouraged to continue using ASV therapy nightly and for greater than 4 hours each night.  Healthy lifestyle habits reviewed.  Memory compensation strategies encouraged.  He will follow-up with [place] in 1 year, sooner if needed.  He verbalizes understanding and agreement with this plan.   I spent 30 minutes of face-to-face and non-face-to-face time with patient.  This included previsit chart review, lab review, study review, order entry, electronic health record documentation, patient education.    Debbora Presto, MSN, FNP-C 03/31/2020, 9:42 AM  [facility] [address], [place], [place] [address] [phone]

## 2020-03-30 NOTE — Patient Instructions (Signed)
Please continue using your ASV regularly. While your insurance requires that you use ASV at least 4 hours each night on 70% of the nights, I recommend, that you not skip any nights and use it throughout the night if you can. Getting used to ASV and staying with the treatment long term does take time and patience and discipline. Untreated obstructive sleep apnea when it is moderate to severe can have an adverse impact on cardiovascular health and raise her risk for heart disease, arrhythmias, hypertension, congestive heart failure, stroke and diabetes. Untreated obstructive sleep apnea causes sleep disruption, nonrestorative sleep, and sleep deprivation. This can have an impact on your day to day functioning and cause daytime sleepiness and impairment of cognitive function, memory loss, mood disturbance, and problems focussing. Using ASV regularly can improve these symptoms.  Continue Aricept 10 mg daily and Namenda 10 mg twice daily.  Follow-up in 1 year  Memory Compensation Strategies  1. Use "WARM" strategy.  W= write it down  A= associate it  R= repeat it  M= make a mental note  2.   You can keep a Social worker.  Use a 3-ring notebook with sections for the following: calendar, important names and phone numbers,  medications, doctors' names/phone numbers, lists/reminders, and a section to journal what you did  each day.   3.    Use a calendar to write appointments down.  4.    Write yourself a schedule for the day.  This can be placed on the calendar or in a separate section of the Memory Notebook.  Keeping a  regular schedule can help memory.  5.    Use medication organizer with sections for each day or morning/evening pills.  You may need help loading it  6.    Keep a basket, or pegboard by the door.  Place items that you need to take out with you in the basket or on the pegboard.  You may also want to  include a message board for reminders.  7.    Use sticky notes.  Place  sticky notes with reminders in a place where the task is performed.  For example: " turn off the  stove" placed by the stove, "lock the door" placed on the door at eye level, " take your medications" on  the bathroom mirror or by the place where you normally take your medications.  8.    Use alarms/timers.  Use while cooking to remind yourself to check on food or as a reminder to take your medicine, or as a  reminder to make a call, or as a reminder to perform another task, etc.   Sleep Apnea Sleep apnea affects breathing during sleep. It causes breathing to stop for a short time or to become shallow. It can also increase the risk of:  Heart attack.  Stroke.  Being very overweight (obese).  Diabetes.  Heart failure.  Irregular heartbeat. The goal of treatment is to help you breathe normally again. What are the causes? There are three kinds of sleep apnea:  Obstructive sleep apnea. This is caused by a blocked or collapsed airway.  Central sleep apnea. This happens when the brain does not send the right signals to the muscles that control breathing.  Mixed sleep apnea. This is a combination of obstructive and central sleep apnea. The most common cause of this condition is a collapsed or blocked airway. This can happen if:  Your throat muscles are too relaxed.  Your tongue and  tonsils are too large.  You are overweight.  Your airway is too small. What increases the risk?  Being overweight.  Smoking.  Having a small airway.  Being older.  Being male.  Drinking alcohol.  Taking medicines to calm yourself (sedatives or tranquilizers).  Having family members with the condition. What are the signs or symptoms?  Trouble staying asleep.  Being sleepy or tired during the day.  Getting angry a lot.  Loud snoring.  Headaches in the morning.  Not being able to focus your mind (concentrate).  Forgetting things.  Less interest in sex.  Mood  swings.  Personality changes.  Feelings of sadness (depression).  Waking up a lot during the night to pee (urinate).  Dry mouth.  Sore throat. How is this diagnosed?  Your medical history.  A physical exam.  A test that is done when you are sleeping (sleep study). The test is most often done in a sleep lab but may also be done at home. How is this treated?   Sleeping on your side.  Using a medicine to get rid of mucus in your nose (decongestant).  Avoiding the use of alcohol, medicines to help you relax, or certain pain medicines (narcotics).  Losing weight, if needed.  Changing your diet.  Not smoking.  Using a machine to open your airway while you sleep, such as: ? An oral appliance. This is a mouthpiece that shifts your lower jaw forward. ? A CPAP device. This device blows air through a mask when you breathe out (exhale). ? An EPAP device. This has valves that you put in each nostril. ? A BPAP device. This device blows air through a mask when you breathe in (inhale) and breathe out.  Having surgery if other treatments do not work. It is important to get treatment for sleep apnea. Without treatment, it can lead to:  High blood pressure.  Coronary artery disease.  In men, not being able to have an erection (impotence).  Reduced thinking ability. Follow these instructions at home: Lifestyle  Make changes that your doctor recommends.  Eat a healthy diet.  Lose weight if needed.  Avoid alcohol, medicines to help you relax, and some pain medicines.  Do not use any products that contain nicotine or tobacco, such as cigarettes, e-cigarettes, and chewing tobacco. If you need help quitting, ask your doctor. General instructions  Take over-the-counter and prescription medicines only as told by your doctor.  If you were given a machine to use while you sleep, use it only as told by your doctor.  If you are having surgery, make sure to tell your doctor you  have sleep apnea. You may need to bring your device with you.  Keep all follow-up visits as told by your doctor. This is important. Contact a doctor if:  The machine that you were given to use during sleep bothers you or does not seem to be working.  You do not get better.  You get worse. Get help right away if:  Your chest hurts.  You have trouble breathing in enough air.  You have an uncomfortable feeling in your back, arms, or stomach.  You have trouble talking.  One side of your body feels weak.  A part of your face is hanging down. These symptoms may be an emergency. Do not wait to see if the symptoms will go away. Get medical help right away. Call your local emergency services (911 in the [place] Do not drive yourself to the  hospital. Summary  This condition affects breathing during sleep.  The most common cause is a collapsed or blocked airway.  The goal of treatment is to help you breathe normally while you sleep. This information is not intended to replace advice given to you by your health care provider. Make sure you discuss any questions you have with your health care provider. Document Revised: 03/14/2018 Document Reviewed: 01/21/2018 Elsevier Patient Education  [place].

## 2020-03-31 ENCOUNTER — Encounter: Payer: Self-pay | Admitting: Family Medicine

## 2020-03-31 ENCOUNTER — Ambulatory Visit: Payer: Medicare HMO | Admitting: Family Medicine

## 2020-03-31 VITALS — BP 130/74 | HR 63 | Ht 63.0 in | Wt 152.0 lb

## 2020-03-31 DIAGNOSIS — Z7189 Other specified counseling: Secondary | ICD-10-CM

## 2020-03-31 DIAGNOSIS — G4733 Obstructive sleep apnea (adult) (pediatric): Secondary | ICD-10-CM

## 2020-03-31 DIAGNOSIS — R413 Other amnesia: Secondary | ICD-10-CM

## 2020-03-31 MED ORDER — MEMANTINE HCL 10 MG PO TABS: 10.0000 mg | ORAL_TABLET | Freq: Two times a day (BID) | ORAL | 4 refills | Status: DC

## 2020-03-31 MED ORDER — DONEPEZIL HCL 10 MG PO TABS: 10.0000 mg | ORAL_TABLET | Freq: Every day | ORAL | 4 refills | Status: DC

## 2020-03-31 NOTE — Progress Notes (Signed)
I reviewed note and agree with plan.   Penni Bombard, MD 06/14/457, 13:68 AM Certified in Neurology, Neurophysiology and Neuroimaging  [facility] [address] [place], [place] [address] [phone]

## 2020-07-12 LAB — COMPREHENSIVE METABOLIC PANEL
ALT: 31 IU/L (ref 0–44)
AST: 18 IU/L (ref 0–40)
Albumin/Globulin Ratio: 2.3 — ABNORMAL HIGH (ref 1.2–2.2)
Albumin: 4.4 g/dL (ref 3.7–4.7)
Alkaline Phosphatase: 57 IU/L (ref 44–121)
BUN/Creatinine Ratio: 24 (ref 10–24)
BUN: 26 mg/dL (ref 8–27)
Bilirubin Total: 0.3 mg/dL (ref 0.0–1.2)
CO2: 26 mmol/L (ref 20–29)
Calcium: 9.7 mg/dL (ref 8.6–10.2)
Chloride: 109 mmol/L — ABNORMAL HIGH (ref 96–106)
Creatinine, Ser: 1.1 mg/dL (ref 0.76–1.27)
GFR calc Af Amer: 74 mL/min/{1.73_m2} (ref >59)
GFR calc non Af Amer: 64 mL/min/{1.73_m2} (ref >59)
Globulin, Total: 1.9 g/dL (ref 1.5–4.5)
Glucose: 111 mg/dL — ABNORMAL HIGH (ref 65–99)
Potassium: 4.8 mmol/L (ref 3.5–5.2)
Sodium: 146 mmol/L — ABNORMAL HIGH (ref 134–144)
Total Protein: 6.3 g/dL (ref 6.0–8.5)

## 2020-07-18 ENCOUNTER — Ambulatory Visit: Payer: Medicare HMO | Admitting: Cardiovascular Disease

## 2020-07-18 ENCOUNTER — Other Ambulatory Visit: Payer: Self-pay

## 2020-07-18 ENCOUNTER — Encounter: Payer: Self-pay | Admitting: Cardiovascular Disease

## 2020-07-18 DIAGNOSIS — R413 Other amnesia: Secondary | ICD-10-CM

## 2020-07-18 DIAGNOSIS — G4733 Obstructive sleep apnea (adult) (pediatric): Secondary | ICD-10-CM | POA: Diagnosis not present

## 2020-07-18 DIAGNOSIS — E782 Mixed hyperlipidemia: Secondary | ICD-10-CM | POA: Diagnosis not present

## 2020-07-18 DIAGNOSIS — Z8616 Personal history of COVID-19: Secondary | ICD-10-CM

## 2020-07-18 DIAGNOSIS — Z79899 Other long term (current) drug therapy: Secondary | ICD-10-CM | POA: Diagnosis not present

## 2020-07-18 DIAGNOSIS — I10 Essential (primary) hypertension: Secondary | ICD-10-CM | POA: Diagnosis not present

## 2020-07-18 MED ORDER — OMEGA-3-ACID ETHYL ESTERS 1 G PO CAPS: 1.0000 g | ORAL_CAPSULE | Freq: Every day | ORAL | 3 refills | Status: DC

## 2020-07-18 MED ORDER — EZETIMIBE 10 MG PO TABS: 10.0000 mg | ORAL_TABLET | Freq: Every day | ORAL | 3 refills | Status: DC

## 2020-07-18 NOTE — Progress Notes (Signed)
Patient ID: Carlos Pierce, male   DOB: [birth year], 80 y.o.   MRN: [id number]     HPI: Carlos Pierce is a 80 y.o. male who presents to the office today for a 10 month cardiology evaluation.  Carlos Pierce  has a history of mild hypertension and hyperlipidemia. Remotely, he has had mild elevation of PSAs which have been followed by Drs Carlos Pierce and more recently Dr. Mar Pierce.  He has had mild transaminase elevation in the past on Crestor.  In 2013 laboratory revealed  a total cholesterol 213 triglycerides 136 HDL 46 LDL 140. PSA was 3.19. Normal LFTs.  He has tolerated  Livalo 2 mg in addition to Zetia 10 mg. Repeat blood work on 01/12/2013 showed a total cholesterol 144 HDL 55 LDL 70 VLDL 19 triglycerides 96. He is tolerating the medicine well without side effects. Liver function studies have remained normal; PSA  decreased at 2.61. He remains active and exercises daily. Over the past 6 years he has lost 15-20 pounds as a result of his activity and improve diet.  He has continued to exercise at least  5 days per week.  He exercises on a stationary bike for over an hour for approximately 18 miles 3 days per week and on alternating days does low resistance training.  He had undergone a colonoscopy by Dr. Earlean Pierce.He denies chest pain PND or orthopnea. He denies palpitations. He denies myalgias.  He had experienced one episode where he said up very abruptly and did have transient dizziness where he stumbled.  He has not had recurrence of this.  He denied associated palpitations.  He denied vertigo.  In December 2016 lipid studies were excellent with a total cholesterol 141, triglycerides 96, HDL 47 and LDL 75.  PSA was 3.54.   In 06/29/2016  CBC was normal.  PSA was 2.7.  BUN27, creatinine 1.17.  LFTs were normal.  Lipid studies continue to be excellent with a total cholesterol of 146, triglycerides 1:30, HDL 51, and LDL 69.   Since I saw him in January 2018, he has had memory issues.   He also had some development of lower back discomfort.  He held off exercise for approximately 2 months but is now back exercising again.  He denies chest pain or shortness of breath.  He has now required hearing aids.  He underwent a sleep evaluation by the neurologist and was found to have mild sleep apnea.  Repeat blood pressure was 118/64 he uses CPAP for obstructive sleep apnea and admits to 100% compliance.  Laboratory in January 2019 showed a total cholesterol 173, HDL 56, LDL 94 and triglycerides 130.  He was Zetia and pravastatin 40 mg.  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  He continues to be on lisinopril 5 mg for hypertension.  GERD is controlled with Zantac.  Due to concerns for memory issues and statins he ultimately was advised to discontinue pravastatin by neurology.  Since I saw him in April 2019, he has been followed by Dr. Leta Pierce and is now donepezil 10 mg at bedtime in addition to memantine 10 mg twice a day.  His wife was wondering about potential additional evaluation elsewhere.  According to the patient not had any major improvement in memory but perhaps there is not been as rapid deterioration as there had in the past.  At times his wife has noticed that it is difficult for him to get words out.  The patient denies  any chest tightness or pressure.  He denied palpitations, presyncope or syncope.    I last saw him on March 79 2020.  Laboratory on August 12, 2018  showed mild glucose elevation at 106.  Renal function was improved with a creatinine of 1.26.  Fair to 5 months ago lipid studies are significantly improved with total cholesterol improving from 261 to 175, triglycerides 155 to63, and LDL cholesterol from 180 to 110.  He continues to take Zetia.  He also has been taking his son's lovaza generic omega 3 fatty acid.  In retrospect, he does not believe the pravastatin contributed to any of his memory issues.    In 2020 He contracted COVID-19  from his son and had symptoms of fever for several days, cough, loss of taste and smell.  He had taken some of his wife's doxycycline days and he believes this may have ameliorated some of his symptoms.  However he did note significant residual fatigability.  He admits to fatigue.  He continues to have difficulty losing his words and recently saw his neurologist who scheduled him for repeat sleep study which is scheduled for October 13.  He denies chest pain.  He denies palpitations.  He is not taking rosuvastatin but is taking Zetia 10 mg as well as lisinopril 5 mg daily and lovasa 1 capsule daily.    I last evaluated him in a telemedicine visit in April 2021.  He had undergone a sleep study by Dr. Rexene Pierce and initially was tried on BiPAP therapy.  He was ultimately switched to ASV therapy with an EPAP of 15, pressure support minimum of 3 maximum of 10 with 1 L of supplemental oxygen.  He was continuing to have some memory difficulty and continues to be on Namenda and Aricept.  Since his last evaluation, he underwent recent resection of a basal cell cancer on his nose.  He uses hearing aids for difficulty hearing.  He continues to use ASV therapy.  He denies chest pain.  He is no longer taking lisinopril.  He continues to be on Zetia 10 mg and Lovaza for his mixed hyperlipidemia.  He continues to be on Aricept and Namenda for his dementia.  He presents for reevaluation.   Current Outpatient Medications  Medication Sig Dispense Refill  . ACETYLCYSTEINE PO Take by mouth.    Marland Kitchen BRAN FIBER PO Take by mouth.    . Coenzyme Q10 (CO Q 10 PO) Take 300 mg by mouth daily.    Marland Kitchen donepezil (ARICEPT) 10 MG tablet Take 1 tablet (10 mg total) by mouth at bedtime. 90 tablet 4  . famotidine (PEPCID) 20 MG tablet Take 20 mg by mouth daily.     . fluticasone (FLONASE) 50 MCG/ACT nasal spray Place into the nose.    . Ginkgo Biloba 40 MG TABS Take 240 mg by mouth daily.    Marland Kitchen L-Tyrosine 500 MG CAPS Take 1 capsule by mouth  daily.    . memantine (NAMENDA) 10 MG tablet Take 1 tablet (10 mg total) by mouth 2 (two) times daily. 180 tablet 4  . Multiple Vitamins-Minerals (MULTIVITAMIN & MINERAL PO) Take 1 tablet by mouth daily.    Marland Kitchen ezetimibe (ZETIA) 10 MG tablet Take 1 tablet (10 mg total) by mouth daily. 90 tablet 3  . lisinopril (ZESTRIL) 5 MG tablet Take 0.5 tablets (2.5 mg total) by mouth daily. (Patient not taking: Reported on 07/18/2020) 90 tablet 0  . omega-3 acid ethyl esters (LOVAZA) 1 g capsule Take 1  capsule (1 g total) by mouth daily. 90 capsule 3   No current facility-administered medications for this visit.    Social History   Socioeconomic History  . Marital status: Married    Spouse name: [name]  . Number of children: 1  . Years of education: 84  . Highest education level: Not on file  Occupational History    Comment: [employer]  Tobacco Use  . Smoking status: Never Smoker  . Smokeless tobacco: Never Used  Substance and Sexual Activity  . Alcohol use: Yes    Alcohol/week: 2.0 standard drinks    Types: 2 Standard drinks or equivalent per week    Comment: wine a few days a week  . Drug use: No  . Sexual activity: Not on file  Other Topics Concern  . Not on file  Social History Narrative   Drinks 1 cup of coffee a day    Social Determinants of Health   Financial Resource Strain: Not on file  Food Insecurity: Not on file  Transportation Needs: Not on file  Physical Activity: Not on file  Stress: Not on file  Social Connections: Not on file  Intimate Partner Violence: Not on file    Additional social history is notable in that he is married has one child who is also my patient who has a history of hyperlipidemia.  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  ROS General: Negative; No fevers, chills, or night sweats; fatigue HEENT:  now using a hearing aid, no changes in vision, sinus congestion, difficulty swallowing Pulmonary: Negative; No cough, wheezing,  shortness of breath, hemoptysis Cardiovascular: Negative; No chest pain, presyncope, syncope, palpitations GI: Negative; No nausea, vomiting, diarrhea, or abdominal pain GU: Negative; No dysuria, hematuria, or difficulty voiding Musculoskeletal: Negative; no myalgias, joint pain, or weakness Hematologic/Oncology: Negative; no easy bruising, bleeding Endocrine: Negative; no heat/cold intolerance; no diabetes Neuro: Positive for dementia Skin: Recent basal cell resection from his nose Psychiatric: Negative; No behavioral problems, depression Sleep:  on ASV per neurology; he is unaware of breakthrough snoring ,daytime sleepiness, hypersomnolence, bruxism, restless legs, hypnogognic hallucinations, no cataplexy Other comprehensive 14 point system review is negative.   PE BP 120/80 (BP Location: Right Arm, Patient Position: Sitting)   Pulse 63   Ht _0  (1.676 m)   Wt 155 lb (70.3 kg)   SpO2 98%   BMI 25.02 kg/m    Repeat blood pressure by me was 126/78  Wt Readings from Last 3 Encounters:  07/18/20 155 lb (70.3 kg)  03/31/20 152 lb (68.9 kg)  09/29/19 149 lb (67.6 kg)   General: Alert, oriented, no distress.  Skin: normal turgor, no rashes, warm and dry HEENT: Normocephalic, atraumatic. Pupils equal round and reactive to light; sclera anicteric; extraocular muscles intact;  Nose without nasal septal hypertrophy Mouth/Parynx benign; Mallinpatti scale 3 Neck: No JVD, no carotid bruits; normal carotid upstroke Lungs: clear to ausculatation and percussion; no wheezing or rales Chest wall: without tenderness to palpitation Heart: PMI not displaced, RRR, s1 s2 normal, 1/6 systolic murmur, no diastolic murmur, no rubs, gallops, thrills, or heaves Abdomen: soft, nontender; no hepatosplenomehaly, BS+; abdominal aorta nontender and not dilated by palpation. Back: no CVA tenderness Pulses 2+ Musculoskeletal: full range of motion, normal strength, no joint deformities Extremities: no  clubbing cyanosis or edema, Homan's sign negative  Neurologic: grossly nonfocal; Cranial nerves grossly wnl Psychologic: Normal mood and affect  ECG (independently read by me): NSR at 63; no ectopy,  normal intervals  March 20, 2019 ECG (independently read by me): Normal sinus rhythm with mild sinus arrhythmia at 65 bpm.  No ectopy.  Normal intervals  March 2020 ECG (independently read by me): Sinus bradycardia 59 bpm.  No ectopy.  Normal intervals.  April 2019 ECG (independently read by me): Normal sinus rhythm at 64 bpm.  PR interval 156 bili seconds.  QTc interval 456 ms.  January 2018 ECG (independently read by me):  Normal sinus rhythm at 67 bpm.  No ectopy.  Q wave in lead 3 and aVF with preserved R waves.  January 2016ECG: (Independently read by me): Normal sinus rhythm at 72 bpm.  No ectopy.  Normal intervals.  January 2015 ECG: (Independently read by me): Normal sinus rhythm at 71 beats per minute. No ST changes. Normal intervals.   LABS: BMP Latest Ref Rng & Units 07/11/2020 09/28/2019 03/09/2019  Glucose 65 - 99 mg/dL 111(H) 110(H) 110(H)  BUN 8 - 27 mg/dL 26 31(H) 20  Creatinine 0.76 - 1.27 mg/dL 1.10 0.99 1.11  BUN/Creat Ratio 10 - 24 24 31(H) 18  Sodium 134 - 144 mmol/L 146(H) 144 142  Potassium 3.5 - 5.2 mmol/L 4.8 5.1 4.6  Chloride 96 - 106 mmol/L 109(H) 108(H) 107(H)  CO2 20 - 29 mmol/L _0 Calcium 8.6 - 10.2 mg/dL 9.7 9.8 9.6   Hepatic Function Latest Ref Rng & Units 07/11/2020 09/28/2019 03/09/2019  Total Protein 6.0 - 8.5 g/dL 6.3 6.7 6.6  Albumin 3.7 - 4.7 g/dL 4.4 4.6 4.1  AST 0 - 40 IU/L _1 ALT 0 - 44 IU/L 31 33 31  Alk Phosphatase 44 - 121 IU/L 57 72 56  Total Bilirubin 0.0 - 1.2 mg/dL 0.3 0.3 0.5  Bilirubin, Direct 0.0 - 0.2 mg/dL - - -   CBC Latest Ref Rng & Units 03/09/2019 06/29/2016 06/03/2015  WBC 3.4 - 10.8 x10E3/uL 6.7 5.9 5.6  Hemoglobin 13.0 - 17.7 g/dL 12.8(L) 13.6 13.4  Hematocrit 37.5 - 51.0 % 38.7 40.5 39.7  Platelets 150 - 450  x10E3/uL 168 167 146(L)   Lab Results  Component Value Date   MCV 92 03/09/2019   MCV 90.2 06/29/2016   MCV 89.4 06/03/2015    No results found for: HGBA1C  Lab Results  Component Value Date   TSH 3.600 03/09/2019   Lipid Panel     Component Value Date/Time   CHOL 187 09/28/2019 1024   TRIG 72 09/28/2019 1024   HDL 52 09/28/2019 1024   CHOLHDL 3.6 09/28/2019 1024   CHOLHDL 3.1 07/08/2017 0803   VLDL 26 06/29/2016 1021   LDLCALC 122 (H) 09/28/2019 1024   LDLCALC 94 07/08/2017 0803     RADIOLOGY: No results found.  IMPRESSION:  1. Essential hypertension   2. Mixed hyperlipidemia   3. Medication management   4. OSA (obstructive sleep apnea)   5. Short-term memory loss   6. History of 2019 novel coronavirus disease (COVID-19)     ASSESSMENT AND PLAN: Carlos Pierce is a 80 year old gentleman who has a history of hypertension and hyperlipidemia. He has lost approximately 20 pounds over the last 10 years.  Remotely he was on Zetia and Livalo for hyperlipidemia and due to insurance issues the Livalo was ultimately changed to pravastatin.  He  developed dementia and is now on Aricept and Namenda which is followed by neurology.  He was advised by them to discontinue the pravastatin and when I had last seen him he had  been off therapy for 2 months and his LDL cholesterol had risen to 180.  When seen in March 2020 he was back on Zetia alone and LDL cholesterol improved to 110.  He also started to take his sons omega-3 fatty acid Lovaza capsule daily and his triglycerides have significantly improved to 63.  When I last saw him, since he felt that the statin did not affect his memory he was willing to reinitiate a trial of low-dose Crestor 5 mg.  Apparently, he is no longer taking this.  His blood pressure today is stable.  He is no longer taking lisinopril.  He had a recent chemistry evaluation which showed a blood sugar of 111.  Sodium was 146, creatinine 1.1 on July 11, 2020.  He  has not had any recent other labs and I will check a CBC, TSH and fasting lipid studies.  He is now on ASV and believes he is sleeping well.  There is not been significant improvement in his short-term memory loss and he continues to be on Namenda and Aricept.  I will see him in 1 year for reevaluation or sooner as needed   Troy Sine, MD, [facility]  07/24/2020 3:29 PM

## 2020-07-18 NOTE — Patient Instructions (Signed)
Medication Instructions:  Your physician recommends that you continue on your current medications as directed. Please refer to the Current Medication list given to you today.  *If you need a refill on your cardiac medications before your next appointment, please call your pharmacy*   Lab Work: Please return for FASTING labs in 3 weeks (CBC, Lipid, TSH)  Our in office lab hours are Monday-Friday 8:00-4:00, closed for lunch 12:45-1:45 pm.  No appointment needed.  Follow-Up: At [facility], you and your health needs are our priority.  As part of our continuing mission to provide you with exceptional heart care, we have created designated Provider Care Teams.  These Care Teams include your primary Cardiologist (physician) and Advanced Practice Providers (APPs -  Physician Assistants and Nurse Practitioners) who all work together to provide you with the care you need, when you need it.  We recommend signing up for the patient portal called "MyChart".  Sign up information is provided on this After Visit Summary.  MyChart is used to connect with patients for Virtual Visits (Telemedicine).  Patients are able to view lab/test results, encounter notes, upcoming appointments, etc.  Non-urgent messages can be sent to your provider as well.   To learn more about what you can do with MyChart, go to [email or web address].    Your next appointment:   12 month(s)  The format for your next appointment:   In Person  Provider:   Shelva Majestic, MD

## 2020-07-24 ENCOUNTER — Encounter: Payer: Self-pay | Admitting: Cardiovascular Disease

## 2020-08-22 LAB — LIPID PANEL
Chol/HDL Ratio: 3.4 ratio (ref 0.0–5.0)
Cholesterol, Total: 193 mg/dL (ref 100–199)
HDL: 57 mg/dL (ref >39)
LDL Chol Calc (NIH): 116 mg/dL — ABNORMAL HIGH (ref 0–99)
Triglycerides: 115 mg/dL (ref 0–149)
VLDL Cholesterol Cal: 20 mg/dL (ref 5–40)

## 2020-08-22 LAB — CBC
Hematocrit: 41.5 % (ref 37.5–51.0)
Hemoglobin: 13.6 g/dL (ref 13.0–17.7)
MCH: 29.4 pg (ref 26.6–33.0)
MCHC: 32.8 g/dL (ref 31.5–35.7)
MCV: 90 fL (ref 79–97)
Platelets: 149 10*3/uL — ABNORMAL LOW (ref 150–450)
RBC: 4.63 x10E6/uL (ref 4.14–5.80)
RDW: 13 % (ref 11.6–15.4)
WBC: 6.4 10*3/uL (ref 3.4–10.8)

## 2020-08-22 LAB — TSH: TSH: 7.36 u[IU]/mL — ABNORMAL HIGH (ref 0.450–4.500)

## 2020-09-09 ENCOUNTER — Encounter: Payer: Self-pay | Admitting: Family Medicine

## 2020-10-28 ENCOUNTER — Ambulatory Visit: Payer: Medicare HMO | Attending: Family Medicine

## 2020-10-28 ENCOUNTER — Other Ambulatory Visit: Payer: Self-pay

## 2020-10-28 DIAGNOSIS — R4701 Aphasia: Secondary | ICD-10-CM | POA: Diagnosis present

## 2020-10-28 DIAGNOSIS — R41841 Cognitive communication deficit: Secondary | ICD-10-CM | POA: Insufficient documentation

## 2020-10-28 NOTE — Patient Instructions (Addendum)
When you have trouble saying the word you want to say:  1)  Describe it! Describe the size, color, shape, function, composition (what it's made of), and/or location to be able to have the word come sooner, or to have your listener help you out  2) "talk around the word" (say it a totally different way) -get your point out using different words than the one/ones you can't think of  3) Use a synonym - think of another word that means the exact same thing  4) DRAW! You can draw some things you want to say in order to give your listener a hint about what you're talking about  5)  Gesture- make motions to help your listener understand what you are trying to communicate  6) Write down the word, if you can - or the first letter or letters, to help you say the word or to give your listener a hint about what you're trying to say     Memory Compensation Strategies  1. Use "WARM" strategy. W= write it down A=  associate it R=  repeat it M=  make a mental picture  2. You can keep a Social worker. Use a 3-ring notebook with sections for the following:  calendar, important names and phone numbers, medications, doctors' names/phone numbers, "to do list"/reminders, and a section to journal what you did each day  3. Use a calendar to write appointments down.  4. Write yourself a schedule for the day.  This can be placed on the calendar or in a separate section of the Memory Notebook.  Keeping a regular schedule can help memory.  5. Use medication organizer with sections for each day or morning/evening pills  You may need help loading it  6. Keep a basket, or pegboard by the door.   Place items that you need to take out with you in the basket or on the pegboard.  You may also want to include a message board for reminders.  7. Use sticky notes. Place sticky notes with reminders in a place where the task is performed.  For example:  "turn off the stove" placed by the stove, "lock the door"  placed on the door at eye level, "take your medications" on the bathroom mirror or by the place where you normally take your medications  8. Use alarms/timers.  Use while cooking to remind yourself to check on food or as a reminder to take your medicine, or as a reminder to make a call, or as a reminder to perform another task, etc.  9. Use a voice recorder app or small tape recorder to record important information and notes for yourself. Go back at the end of the day and listen to these.

## 2020-10-28 NOTE — Therapy (Signed)
[facility] [address] [place], [place], [address] Phone: [phone]   Fax:  [phone]  Speech Language Pathology Evaluation  Patient Details  Name: Carlos Pierce MRN: [id number] Date of Birth: 12/14/1940 Referring Provider (SLP): Nickola Major, MD   Encounter Date: 10/28/2020   End of Session - 10/28/20 1149    Visit Number 1    Number of Visits 17    Date for SLP Re-Evaluation 01/26/21    Authorization Type [employer]    SLP Start Time 0845    SLP Stop Time  0930    SLP Time Calculation (min) 45 min    Activity Tolerance Patient tolerated treatment well           Past Medical History:  Diagnosis Date  . Memory loss     Past Surgical History:  Procedure Laterality Date  . CATARACT EXTRACTION Bilateral   . HEMORRHOID BANDING  2016    There were no vitals filed for this visit.       SLP Evaluation [facility] - 10/28/20 0827      SLP Visit Information   SLP Received On 10/14/20    Referring Provider (SLP) Nickola Major, MD    Onset Date ~2017; September 2019 - worsening aphasia    Medical Diagnosis Fluency Disorder associated w/ Dementia, Aphasia      General Information   HPI Carlos "Carlos Pierce" Pierce is a 80 y.o. male with PMHX of hypertension, hyperlipidemia, sleep apnea, and dementia who reports worsening aphasia. Major concern is aphasia. Notably worsening aphasia over the past few months. He reportedly has trouble thinking of the word he wants to say or what he was about to say. He cannot exactly describe it in place of finding the word nor can he write it instead. His wife denies significant comprehension problems consistent with receptive aphasia. He does have hearing loss but wears hearing aids. He denies worsening vision issues. He does not report swallowing issues.    Behavioral/Cognition dementia - memory deficits/aphasia      Balance Screen   Has the patient fallen  in the past 6 months No      Prior Functional Status   Cognitive/Linguistic Baseline Within functional limits    Baseline deficit details --     Lives With Spouse    Available Support Family    Vocation Full time employment   coin shop     Cognition   Overall Cognitive Status Impaired/Different from baseline   dementia   Area of Impairment Attention;Memory;Following commands    Attention Comments reduced attention to questions asked    Following Commands Follows one step commands with increased time;Follows multi-step commands inconsistently    Attention Sustained;Selective    Memory Impaired    Memory Impairment Storage deficit;Retrieval deficit;Decreased recall of new information;Decreased short term memory;Decreased long term memory    Awareness --      Auditory Comprehension   Overall Auditory Comprehension Impaired    Yes/No Questions Impaired   mod complex   Commands Impaired   multi-step   Conversation Simple    Interfering Components Working memory;Processing speed;Hearing    EffectiveTechniques Extra processing time;Pausing;Repetition      Verbal Expression   Overall Verbal Expression Impaired    Level of Generative/Spontaneous Verbalization Conversation    Naming Impairment    Responsive 26-50% accurate    Confrontation 50-74% accurate    Verbal Errors Phonemic paraphasias;Neologisms;Semantic paraphasias    Interfering Components Other (comment)  Memory   Effective Techniques Semantic cues;Phonemic cues      Oral Motor/Sensory Function   Overall Oral Motor/Sensory Function Appears within functional limits for tasks assessed      Motor Speech   Overall Motor Speech Appears within functional limits for tasks assessed      Standardized Assessments   Standardized Assessments  Other Assessment   Quick Aphasia Battery= 6.89 (mod); Hopkins Verbal Learning Test                          SLP Education - 10/28/20 [phone]    Education Details eval  results, goals, anomia and memory strategies    Person(s) Educated Patient;Spouse    Methods Explanation;Demonstration;Handout    Comprehension Verbalized understanding;Returned demonstration;Need further instruction            SLP Short Term Goals - 10/28/20 1137      SLP SHORT TERM GOAL #1   Title Pt will verbally generate 5 items in personally relevant categories with occasional min A over 2 sessions    Time 4    Period Weeks    Status New      SLP SHORT TERM GOAL #2   Title Pt able to utilize word finding compensations in 5 minute simple conversation with usual min A  over 2 sessions    Time 4    Period Weeks    Status New      SLP SHORT TERM GOAL #3   Title Caregivers will appropriately cue patient when word finding occurs for 4/5 opportunities given rare min A over 2 sessions    Time 4    Period Weeks    Status New      SLP SHORT TERM GOAL #4   Title Pt will recall and answer simple open/close-ended questions with occasional min A and 2 or less repetitions over 2 sessions    Time 4    Period Weeks    Status New      SLP SHORT TERM GOAL #5   Title Family will use 2 memory compensations at work/home to aid completion of tasks with occasional min A over 2 sessions    Time 4    Period Weeks    Status New            SLP Long Term Goals - 10/28/20 1142      SLP LONG TERM GOAL #1   Title 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 and occasional min A over 2 sessions    Time 8    Period Weeks    Status New      SLP LONG TERM GOAL #2   Title Pt able to utilize word finding compensations in 10 minute simple conversation with occasional min A over 2 sessions    Time 8    Period Weeks    Status New      SLP LONG TERM GOAL #3   Title Pt/caregivers will implement 3 memory compensations at work/home to aid accurate completion of tasks with rare min A over 2 sessions    Time 8    Period Weeks    Status New      SLP LONG TERM  GOAL #4   Title Pt/Caregiver will report reduced frustration and improved communication effectiveness via PROM by last ST session    Time 8    Period Weeks    Status New  Plan - 10/28/20 1134    Clinical Impression Statement "Carlos Pierce" was referred for OPST evaluation to assess worsening aphasia related to dementia. Pt was diagnosed with dementia in 2018 with consistent decline in memory. More recent decline in word finding reported in last several months. Pt's wife stated "he has trouble forming his words" and pt stated anomia occurs "all the time." In conversation today, pt demonstrated usual episodes of anomia that impacted overall message and fluidity of conversation. Pt notably aware of word finding errors, which resulted in frustration. Frustration related to memory and aphasia has been reported at work. 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 Hopkins Verbal Learning Test completed, in which pt demonstrated significantly reduced recall as pt only able to recall 1 word on 2nd and 3rd trials. Recall appeared to also impact recognition of items as pt required usual repetition of instructions and clarification of task. Quick Aphasia Battery also completed, which revealed moderate aphasia. Naming deficits included anomia, empty speech, and phonemic paraphasias. Pt demonstrated ability to describe purpose of item x2 to cue listener of comprehension. Phonemic cues were occasionally effective to aid naming. SLP recommends skilled ST intervention to maximize current cognitive communication skills to improve communication effectiveness and completion of household/work tasks as well as provide caregiver education re: compensations and modifcations as dementia  progresses.    Speech Therapy Frequency 2x / week    Duration 8 weeks   or 17 total visits   Treatment/Interventions Compensatory strategies;Cueing hierarchy;Functional tasks;Patient/family education;Cognitive reorganization;Multimodal communcation approach;Language facilitation;Compensatory techniques;Internal/external aids;SLP instruction and feedback    Potential to Achieve Goals Fair    Potential Considerations Ability to learn/carryover information;Previous level of function;Co-morbidities;Severity of impairments;Medical prognosis    SLP Home Exercise Plan provided    Consulted and Agree with Plan of Care Patient;Family member/caregiver           Patient will benefit from skilled therapeutic intervention in order to improve the following deficits and impairments:   Cognitive communication deficit  Aphasia    Problem List Patient Active Problem List   Diagnosis Date Noted  . Obstructive sleep apnea treated with continuous positive airway pressure (CPAP) 01/08/2017  . Medication management 07/30/2016  . Slow urinary stream 03/21/2016  . Hyperlipidemia 06/22/2014  . Mild HTN 01/16/2013  . HYPERLIPIDEMIA 02/17/2008  . HEMORRHOIDS-INTERNAL 02/17/2008  . GERD 02/12/2008    Alinda Deem, [place] CCC-SLP 10/28/2020, 3:03 PM  [facility] [address] [place], [place], [address] Phone: [phone]   Fax:  [phone]  Name: GARWOOD WENTZELL MRN: [id number] Date of Birth: 1941/04/22

## 2020-11-16 ENCOUNTER — Other Ambulatory Visit: Payer: Self-pay

## 2020-11-16 ENCOUNTER — Ambulatory Visit: Payer: Medicare HMO | Attending: Family Medicine

## 2020-11-16 DIAGNOSIS — R4701 Aphasia: Secondary | ICD-10-CM | POA: Diagnosis present

## 2020-11-16 DIAGNOSIS — R41841 Cognitive communication deficit: Secondary | ICD-10-CM | POA: Diagnosis present

## 2020-11-16 NOTE — Therapy (Signed)
[facility] [address] [place], [place], [address] Phone: [phone]   Fax:  [phone]  Speech Language Pathology Treatment  Patient Details  Name: Carlos Pierce MRN: [id number] Date of Birth: 1940-09-05 Referring Provider (SLP): Nickola Major, MD   Encounter Date: 11/16/2020   End of Session - 11/16/20 1644    Visit Number 2    Number of Visits 17    Date for SLP Re-Evaluation 01/26/21    Authorization Type [employer]    SLP Start Time 1645    SLP Stop Time  1730    SLP Time Calculation (min) 45 min    Activity Tolerance Patient tolerated treatment well           Past Medical History:  Diagnosis Date  . Memory loss     Past Surgical History:  Procedure Laterality Date  . CATARACT EXTRACTION Bilateral   . HEMORRHOID BANDING  2016    There were no vitals filed for this visit.   Subjective Assessment - 11/16/20 1645    Subjective "nothing"    Currently in Pain? No/denies                 ADULT SLP TREATMENT - 11/16/20 1644      General Information   Behavior/Cognition Alert;Pleasant mood;Cooperative      Treatment Provided   Treatment provided Cognitive-Linquistic      Cognitive-Linquistic Treatment   Treatment focused on Aphasia;Cognition;Patient/family/caregiver education    Skilled Treatment Pt reported he read through anomia and memory compensations from evaluation day but he "couldn't make sense of them." SLP provided additional education and examples to facilitate increased understanding with further education required. Pt noted with overt word finding x3 in conversation, in which pt used description x1 and circumlocution x1 with mod prompting. Phonemic paraphasia x1 exhibited in conversation ("strain" for "chain"). 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 SLP suggested patient write down list of regular customers with identifying information (first name, physical apearance, and items usually bought).      Assessment / Recommendations / Plan   Plan Continue with current plan of care      Progression Toward Goals   Progression toward goals Progressing toward goals              SLP Short Term Goals - 11/16/20 1644      SLP SHORT TERM GOAL #1   Title Pt will verbally generate 5 items in personally relevant categories with occasional min A over 2 sessions    Time 4    Period Weeks    Status On-going      SLP SHORT TERM GOAL #2   Title Pt able to utilize word finding compensations in 5 minute simple conversation with usual min A  over 2 sessions    Time 4    Period Weeks    Status On-going      SLP SHORT TERM GOAL #3   Title Caregivers will appropriately cue patient when word finding occurs for 4/5 opportunities given rare min A over 2 sessions    Time 4    Period Weeks    Status On-going      SLP SHORT  TERM GOAL #4   Title Pt will recall and answer simple open/close-ended questions with occasional min A and 2 or less repetitions over 2 sessions    Time 4    Period Weeks    Status On-going      SLP SHORT TERM GOAL #5   Title Family will use 2 memory compensations at work/home to aid completion of tasks with occasional min A over 2 sessions    Time 4    Period Weeks    Status On-going            SLP Long Term Goals - 11/16/20 1645      SLP LONG TERM GOAL #1   Title 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 and occasional min A over 2 sessions    Time 8    Period Weeks    Status On-going      SLP LONG TERM GOAL #2   Title Pt able to utilize word finding compensations in 10 minute simple conversation with  occasional min A over 2 sessions    Time 8    Period Weeks    Status On-going      SLP LONG TERM GOAL #3   Title Pt/caregivers will implement 3 memory compensations at work/home to aid accurate completion of tasks with rare min A over 2 sessions    Time 8    Period Weeks    Status On-going      SLP LONG TERM GOAL #4   Title Pt/Caregiver will report reduced frustration and improved communication effectiveness via PROM by last ST session    Time 8    Period Weeks    Status On-going            Plan - 11/16/20 1733    Clinical Impression Statement "[name]" was referred for OPST intervention to address worsening aphasia and memory related to dementia. SLP reviewed anomia and memory compensations from evaluation and demonstrated functional application. SLP suggested use of writing down information and creation of visual memory aids to aid performance and carryover at work, as that is where pt experiences most difficulty. SLP recommends skilled ST intervention to maximize current cognitive communication skills to improve communication effectiveness and completion of household/work tasks as well as provide caregiver education re: compensations and modifcations as dementia progresses.    Speech Therapy Frequency 2x / week    Duration 8 weeks   or 17 total visits   Treatment/Interventions Compensatory strategies;Cueing hierarchy;Functional tasks;Patient/family education;Cognitive reorganization;Multimodal communcation approach;Language facilitation;Compensatory techniques;Internal/external aids;SLP instruction and feedback    Potential to Achieve Goals Fair    Potential Considerations Ability to learn/carryover information;Previous level of function;Co-morbidities;Severity of impairments;Medical prognosis    SLP Home Exercise Plan provided    Consulted and Agree with Plan of Care Patient;Family member/caregiver    Family Member Consulted wife, Susie           Patient will benefit from skilled  therapeutic intervention in order to improve the following deficits and impairments:   Aphasia  Cognitive communication deficit    Problem List Patient Active Problem List   Diagnosis Date Noted  . Obstructive sleep apnea treated with continuous positive airway pressure (CPAP) 01/08/2017  . Medication management 07/30/2016  . Slow urinary stream 03/21/2016  . Hyperlipidemia 06/22/2014  . Mild HTN 01/16/2013  . HYPERLIPIDEMIA 02/17/2008  . HEMORRHOIDS-INTERNAL 02/17/2008  . GERD 02/12/2008    Alinda Deem, [place] CCC-SLP 11/16/2020, 5:36 PM  [facility] [address] [place], [place], [address] Phone: [phone]   Fax:  [phone]   Name: LUISENRIQUE CONRAN MRN: [id number] Date of Birth: August 23, 1940

## 2020-11-16 NOTE — Patient Instructions (Signed)
Write down the regular Customers (name, look, and buy)        Bring in photos of some items from the store to practice describing in therapy

## 2020-11-18 ENCOUNTER — Ambulatory Visit: Payer: Medicare HMO

## 2020-11-18 ENCOUNTER — Other Ambulatory Visit: Payer: Self-pay

## 2020-11-18 DIAGNOSIS — R4701 Aphasia: Secondary | ICD-10-CM

## 2020-11-18 DIAGNOSIS — R41841 Cognitive communication deficit: Secondary | ICD-10-CM

## 2020-11-18 NOTE — Therapy (Signed)
[facility] [address] [place], [place], [address] Phone: [phone]   Fax:  [phone]  Speech Language Pathology Treatment  Patient Details  Name: Carlos Pierce MRN: [id number] Date of Birth: 24-Oct-1940 Referring Provider (SLP): Nickola Major, MD   Encounter Date: 11/18/2020   End of Session - 11/18/20 0836     Visit Number 3    Number of Visits 17    Date for SLP Re-Evaluation 01/26/21    Authorization Type [employer]    SLP Start Time 0845    SLP Stop Time  0934    SLP Time Calculation (min) 49 min    Activity Tolerance Patient tolerated treatment well             Past Medical History:  Diagnosis Date   Memory loss     Past Surgical History:  Procedure Laterality Date   CATARACT EXTRACTION Bilateral    HEMORRHOID BANDING  2016    There were no vitals filed for this visit.   Subjective Assessment - 11/18/20 0841     Subjective "I'm awake"    Currently in Pain? No/denies                   ADULT SLP TREATMENT - 11/18/20 0836       General Information   Behavior/Cognition Alert;Pleasant mood;Cooperative      Treatment Provided   Treatment provided Cognitive-Linquistic      Cognitive-Linquistic Treatment   Treatment focused on Aphasia;Cognition;Patient/family/caregiver education    Skilled Treatment SLP reviewed HEP for convergent and divergent naming, in which pt required occasional A from wife to complete tasks. Limited descriptors provided, in which SLP demonstrated how to expand responses to cue self and listener. Pt able to demo with usual mod questioning cues. SLP educated patient on using finger to track responses to aid recall. SLP identified pertient information to recall and communicate at store, in which pt able to ID relevant information with min to mod cues. Written visual aid created to use at store when word finding or reduced recall exhibited.       Assessment / Recommendations / Plan   Plan Continue with current plan of care      Progression Toward Goals   Progression toward goals Progressing toward goals              SLP Education - 11/18/20 0937     Education Details description strategy, written visual aids    Person(s) Educated Patient;Spouse    Methods Explanation;Demonstration;Handout    Comprehension Verbalized understanding;Returned demonstration;Need further instruction              SLP Short Term Goals - 11/18/20 0836       SLP SHORT TERM GOAL #1   Title Pt will verbally generate 5 items in personally relevant categories with occasional min A over 2 sessions    Time 4    Period Weeks    Status On-going      SLP SHORT TERM GOAL #2   Title Pt able to utilize word finding compensations in 5 minute simple conversation with usual min A  over 2 sessions    Time 4    Period Weeks    Status On-going      SLP SHORT TERM GOAL #3   Title Caregivers will appropriately cue patient when word finding occurs for 4/5 opportunities given rare min A over 2 sessions    Time 4  Period Weeks    Status On-going      SLP SHORT TERM GOAL #4   Title Pt will recall and answer simple open/close-ended questions with occasional min A and 2 or less repetitions over 2 sessions    Time 4    Period Weeks    Status On-going      SLP SHORT TERM GOAL #5   Title Family will use 2 memory compensations at work/home to aid completion of tasks with occasional min A over 2 sessions    Time 4    Period Weeks    Status On-going              SLP Long Term Goals - 11/18/20 2725       SLP LONG TERM GOAL #1   Title 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 and occasional min A over 2 sessions    Time 8    Period Weeks    Status On-going      SLP LONG TERM GOAL #2   Title Pt able to utilize word finding compensations in 10 minute simple conversation with occasional min A over 2 sessions     Time 8    Period Weeks    Status On-going      SLP LONG TERM GOAL #3   Title Pt/caregivers will implement 3 memory compensations at work/home to aid accurate completion of tasks with rare min A over 2 sessions    Time 8    Period Weeks    Status On-going      SLP LONG TERM GOAL #4   Title Pt/Caregiver will report reduced frustration and improved communication effectiveness via PROM by last ST session    Time 8    Period Weeks    Status On-going              Plan - 11/18/20 0839     Clinical Impression Statement "[name]" was referred for OPST intervention to address worsening aphasia and memory related to dementia. SLP targeted used of anomia and memory compensations, in which pt able to demo with occasional mod A and usual questioning cues to expand responses. SLP created visual memory aid to aid recall and communication of important information at work as that is where pt experiences most difficulty. SLP recommends skilled ST intervention to maximize current cognitive communication skills to improve communication effectiveness and completion of household/work tasks as well as provide caregiver education re: compensations and modifcations as dementia progresses.    Speech Therapy Frequency 2x / week    Duration 8 weeks   or 17 total visits   Treatment/Interventions Compensatory strategies;Cueing hierarchy;Functional tasks;Patient/family education;Cognitive reorganization;Multimodal communcation approach;Language facilitation;Compensatory techniques;Internal/external aids;SLP instruction and feedback    Potential to Achieve Goals Fair    Potential Considerations Ability to learn/carryover information;Previous level of function;Co-morbidities;Severity of impairments;Medical prognosis    SLP Home Exercise Plan provided    Consulted and Agree with Plan of Care Patient;Family member/caregiver    Family Member Consulted wife, Susie             Patient will benefit from skilled  therapeutic intervention in order to improve the following deficits and impairments:   Aphasia  Cognitive communication deficit    Problem List Patient Active Problem List   Diagnosis Date Noted   Obstructive sleep apnea treated with continuous positive airway pressure (CPAP) 01/08/2017   Medication management 07/30/2016   Slow urinary stream 03/21/2016   Hyperlipidemia 06/22/2014  Mild HTN 01/16/2013   HYPERLIPIDEMIA 02/17/2008   HEMORRHOIDS-INTERNAL 02/17/2008   GERD 02/12/2008    Alinda Deem, MA CCC-SLP 11/18/2020, 9:41 AM  [facility] [address] [place], [place], [address] Phone: [phone]   Fax:  [phone]   Name: HENDRYX RICKE MRN: [id number] Date of Birth: July 11, 1940

## 2020-11-22 ENCOUNTER — Other Ambulatory Visit: Payer: Self-pay

## 2020-11-22 ENCOUNTER — Ambulatory Visit: Payer: Medicare HMO

## 2020-11-22 DIAGNOSIS — R41841 Cognitive communication deficit: Secondary | ICD-10-CM

## 2020-11-22 DIAGNOSIS — R4701 Aphasia: Secondary | ICD-10-CM | POA: Diagnosis not present

## 2020-11-22 NOTE — Therapy (Signed)
[facility] [address] [place], [place], [address] Phone: [phone]   Fax:  [phone]  Speech Language Pathology Treatment  Patient Details  Name: Carlos Pierce MRN: [id number] Date of Birth: 26-Nov-1940 Referring Provider (SLP): Nickola Major, MD   Encounter Date: 11/22/2020   End of Session - 11/22/20 1641     Visit Number 4    Number of Visits 17    Date for SLP Re-Evaluation 01/26/21    Authorization Type [employer]    SLP Start Time 1645    SLP Stop Time  9371    SLP Time Calculation (min) 45 min    Activity Tolerance Patient tolerated treatment well             Past Medical History:  Diagnosis Date   Memory loss     Past Surgical History:  Procedure Laterality Date   CATARACT EXTRACTION Bilateral    HEMORRHOID BANDING  2016    There were no vitals filed for this visit.   Subjective Assessment - 11/22/20 1644     Subjective "I don't remember what I did this weekend"    Patient is accompained by: Family member   Susie   Currently in Pain? No/denies                   ADULT SLP TREATMENT - 11/22/20 1641       General Information   Behavior/Cognition Alert;Pleasant mood;Cooperative      Treatment Provided   Treatment provided Cognitive-Linquistic      Cognitive-Linquistic Treatment   Treatment focused on Aphasia;Cognition;Patient/family/caregiver education    Skilled Treatment Pt noted with increased word finding and dysfluency this session. Pt read through naming HEP, with occasional A to articulate words (ex: relatives, containers, necklace) with some self-corrections noted for errors. Omisisons noted while reading. SLP targeted reading short paragraphs, with verbal and visual cues to track with finger to aid rate, word finding, and processing. Rare A required to correct errors while reading. Reduced comprehension noted when only read through once. SLP  reviewed memory compensations (repetition) and use of previously created external memory aid, in which pt added additional information with moderate questioning cues.      Assessment / Recommendations / Plan   Plan Continue with current plan of care      Progression Toward Goals   Progression toward goals Progressing toward goals              SLP Education - 11/22/20 1735     Education Details compensations, techniques to aid word finding and processing    Person(s) Educated Patient;Spouse    Methods Explanation;Demonstration;Handout    Comprehension Verbalized understanding;Returned demonstration;Need further instruction              SLP Short Term Goals - 11/22/20 1642       SLP SHORT TERM GOAL #1   Title Pt will verbally generate 5 items in personally relevant categories with occasional min A over 2 sessions    Time 3    Period Weeks    Status On-going      SLP SHORT TERM GOAL #2   Title Pt able to utilize word finding compensations in 5 minute simple conversation with usual min A  over 2 sessions    Time 3    Period Weeks    Status On-going      SLP SHORT TERM GOAL #3   Title Caregivers will appropriately cue patient when  word finding occurs for 4/5 opportunities given rare min A over 2 sessions    Time 3    Period Weeks    Status On-going      SLP SHORT TERM GOAL #4   Title Pt will recall and answer simple open/close-ended questions with occasional min A and 2 or less repetitions over 2 sessions    Time 3    Period Weeks    Status On-going      SLP SHORT TERM GOAL #5   Title Family will use 2 memory compensations at work/home to aid completion of tasks with occasional min A over 2 sessions    Time 3    Period Weeks    Status On-going              SLP Long Term Goals - 11/22/20 1642       SLP LONG TERM GOAL #1   Title 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 and occasional min A over 2 sessions    Time 7     Period Weeks    Status On-going      SLP LONG TERM GOAL #2   Title Pt able to utilize word finding compensations in 10 minute simple conversation with occasional min A over 2 sessions    Time 7    Period Weeks    Status On-going      SLP LONG TERM GOAL #3   Title Pt/caregivers will implement 3 memory compensations at work/home to aid accurate completion of tasks with rare min A over 2 sessions    Time 7    Period Weeks    Status On-going      SLP LONG TERM GOAL #4   Title Pt/Caregiver will report reduced frustration and improved communication effectiveness via PROM by last ST session    Time 7    Period Weeks    Status On-going              Plan - 11/22/20 1642     Clinical Impression Statement "Carlos Pierce" was referred for OPST intervention to address worsening aphasia and memory related to dementia. Pt demonstrated increased word finding and dysfluency this session, with occasional errors while reading noted (omissions and articulation errors). Some self-corrections noted. SLP educated patient and wife on techniques to aid word finding and recall. SLP recommends skilled ST intervention to maximize current cognitive communication skills to improve communication effectiveness and completion of household/work tasks as well as provide caregiver education re: compensations and modifcations as dementia progresses.    Speech Therapy Frequency 2x / week    Duration 8 weeks   or 17 total visits   Treatment/Interventions Compensatory strategies;Cueing hierarchy;Functional tasks;Patient/family education;Cognitive reorganization;Multimodal communcation approach;Language facilitation;Compensatory techniques;Internal/external aids;SLP instruction and feedback    Potential to Achieve Goals Fair    Potential Considerations Ability to learn/carryover information;Previous level of function;Co-morbidities;Severity of impairments;Medical prognosis    SLP Home Exercise Plan provided    Consulted and  Agree with Plan of Care Patient;Family member/caregiver    Family Member Consulted wife, Susie             Patient will benefit from skilled therapeutic intervention in order to improve the following deficits and impairments:   Aphasia  Cognitive communication deficit    Problem List Patient Active Problem List   Diagnosis Date Noted   Obstructive sleep apnea treated with continuous positive airway pressure (CPAP) 01/08/2017   Medication management 07/30/2016   Slow  urinary stream 03/21/2016   Hyperlipidemia 06/22/2014   Mild HTN 01/16/2013   HYPERLIPIDEMIA 02/17/2008   HEMORRHOIDS-INTERNAL 02/17/2008   GERD 02/12/2008    Alinda Deem, MA CCC-SLP 11/22/2020, 5:37 PM  [facility] [address] [place], [place], [address] Phone: [phone]   Fax:  [phone]   Name: CRIAG WICKLUND MRN: [id number] Date of Birth: 1941/04/01

## 2020-11-25 ENCOUNTER — Ambulatory Visit: Payer: Medicare HMO

## 2020-11-25 ENCOUNTER — Other Ambulatory Visit: Payer: Self-pay

## 2020-11-25 DIAGNOSIS — R4701 Aphasia: Secondary | ICD-10-CM

## 2020-11-25 DIAGNOSIS — R41841 Cognitive communication deficit: Secondary | ICD-10-CM

## 2020-11-25 NOTE — Patient Instructions (Signed)
Write name of [place] [employer] where popular coins come from            Write down names of popular coins (ex: double eagle, lady liberty)

## 2020-11-25 NOTE — Therapy (Signed)
[facility] [address] [place], [place], [address] Phone: [phone]   Fax:  [phone]  Speech Language Pathology Treatment  Patient Details  Name: Carlos Pierce DOBLER MRN: [id number] Date of Birth: June 29, 1940 Referring Provider (SLP): Nickola Major, MD   Encounter Date: 11/25/2020   End of Session - 11/25/20 0854     Visit Number 5    Number of Visits 17    Date for SLP Re-Evaluation 01/26/21    Authorization Type [employer]    SLP Start Time 0845    SLP Stop Time  0930    SLP Time Calculation (min) 45 min    Activity Tolerance Patient tolerated treatment well             Past Medical History:  Diagnosis Date   Memory loss     Past Surgical History:  Procedure Laterality Date   CATARACT EXTRACTION Bilateral    HEMORRHOID BANDING  2016    There were no vitals filed for this visit.   Subjective Assessment - 11/25/20 0847     Subjective "I'm doing better now"    Patient is accompained by: Family member   wife, Susie   Currently in Pain? No/denies                   ADULT SLP TREATMENT - 11/25/20 0850       General Information   Behavior/Cognition Alert;Pleasant mood;Cooperative      Treatment Provided   Treatment provided Cognitive-Linquistic      Cognitive-Linquistic Treatment   Treatment focused on Aphasia;Cognition;Patient/family/caregiver education    Skilled Treatment Pt read through naming HEP, with rare omissions and articulation errors noted. Improved fluency while reading demonstrated compared to last session. 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 SLP recommended patient write down specific coins collected and locations of coins minted as pt unable to name or recall at this time.      Assessment / Recommendations / Plan   Plan Continue with current plan of  care      Progression Toward Goals   Progression toward goals Progressing toward goals              SLP Education - 11/25/20 1220     Education Details external memory/naming aids related to items sold at store    Person(s) Educated Patient;Spouse    Methods Explanation;Demonstration;Handout    Comprehension Verbalized understanding;Returned demonstration;Need further instruction              SLP Short Term Goals - 11/25/20 0854       SLP SHORT TERM GOAL #1   Title Pt will verbally generate 5 items in personally relevant categories with occasional min A over 2 sessions    Time 3    Period Weeks    Status On-going      SLP SHORT TERM GOAL #2   Title Pt able to utilize word finding compensations in 5 minute simple conversation with usual min A  over 2 sessions    Time 3    Period Weeks    Status On-going      SLP SHORT TERM GOAL #3   Title Caregivers will appropriately cue patient when word finding occurs for 4/5 opportunities given rare min A over 2 sessions    Time 3    Period Weeks    Status On-going      SLP  SHORT TERM GOAL #4   Title Pt will recall and answer simple open/close-ended questions with occasional min A and 2 or less repetitions over 2 sessions    Time 3    Period Weeks    Status On-going      SLP SHORT TERM GOAL #5   Title Family will use 2 memory compensations at work/home to aid completion of tasks with occasional min A over 2 sessions    Time 3    Period Weeks    Status On-going              SLP Long Term Goals - 11/25/20 0854       SLP LONG TERM GOAL #1   Title 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 and occasional min A over 2 sessions    Time 7    Period Weeks    Status On-going      SLP LONG TERM GOAL #2   Title Pt able to utilize word finding compensations in 10 minute simple conversation with occasional min A over 2 sessions    Time 7    Period Weeks    Status On-going      SLP  LONG TERM GOAL #3   Title Pt/caregivers will implement 3 memory compensations at work/home to aid accurate completion of tasks with rare min A over 2 sessions    Time 7    Period Weeks    Status On-going      SLP LONG TERM GOAL #4   Title Pt/Caregiver will report reduced frustration and improved communication effectiveness via PROM by last ST session    Time 7    Period Weeks    Status On-going              Plan - 11/25/20 1221     Clinical Impression Statement "[name]" was referred for OPST intervention to address worsening aphasia and memory related to dementia. Pt demonstrated usual empty speech and fillers in conversation this session despite use of questioning cues. Less frequent errors noted while reading noted (omissions and articulation errors). Some self-corrections noted. SLP educated patient and wife on techniques to aid word finding and recall, including adding specific sold items to communication/memory aid for the store. SLP recommends skilled ST intervention to maximize current cognitive communication skills to improve communication effectiveness and completion of household/work tasks as well as provide caregiver education re: compensations and modifcations as dementia progresses.    Speech Therapy Frequency 2x / week    Duration 8 weeks   or 17 total visits   Treatment/Interventions Compensatory strategies;Cueing hierarchy;Functional tasks;Patient/family education;Cognitive reorganization;Multimodal communcation approach;Language facilitation;Compensatory techniques;Internal/external aids;SLP instruction and feedback    Potential to Achieve Goals Fair    Potential Considerations Ability to learn/carryover information;Previous level of function;Co-morbidities;Severity of impairments;Medical prognosis    SLP Home Exercise Plan provided    Consulted and Agree with Plan of Care Patient;Family member/caregiver    Family Member Consulted wife, Susie             Patient  will benefit from skilled therapeutic intervention in order to improve the following deficits and impairments:   Aphasia  Cognitive communication deficit    Problem List Patient Active Problem List   Diagnosis Date Noted   Obstructive sleep apnea treated with continuous positive airway pressure (CPAP) 01/08/2017   Medication management 07/30/2016   Slow urinary stream 03/21/2016   Hyperlipidemia 06/22/2014   Mild HTN 01/16/2013   HYPERLIPIDEMIA  02/17/2008   HEMORRHOIDS-INTERNAL 02/17/2008   GERD 02/12/2008    Alinda Deem, MA CCC-SLP 11/25/2020, 12:22 PM  [facility] [address] [place], [place], [address] Phone: [phone]   Fax:  [phone]   Name: BROUGHTON EPPINGER MRN: [id number] Date of Birth: 1940/06/21

## 2020-11-28 ENCOUNTER — Ambulatory Visit: Payer: Medicare HMO

## 2020-11-28 ENCOUNTER — Other Ambulatory Visit: Payer: Self-pay

## 2020-11-28 DIAGNOSIS — R4701 Aphasia: Secondary | ICD-10-CM | POA: Diagnosis not present

## 2020-11-28 DIAGNOSIS — R41841 Cognitive communication deficit: Secondary | ICD-10-CM

## 2020-11-28 NOTE — Therapy (Signed)
[facility] [address] [place], [place], [address] Phone: [phone]   Fax:  [phone]  Speech Language Pathology Treatment  Patient Details  Name: Carlos Pierce MRN: [id number] Date of Birth: 03/17/1941 Referring Provider (SLP): Nickola Major, MD   Encounter Date: 11/28/2020   End of Session - 11/28/20 1655     Visit Number 6    Number of Visits 17    Date for SLP Re-Evaluation 01/26/21    Authorization Type [employer]    SLP Start Time 1655    SLP Stop Time  1740    SLP Time Calculation (min) 45 min    Activity Tolerance Patient tolerated treatment well             Past Medical History:  Diagnosis Date   Memory loss     Past Surgical History:  Procedure Laterality Date   CATARACT EXTRACTION Bilateral    HEMORRHOID BANDING  2016    There were no vitals filed for this visit.   Subjective Assessment - 11/28/20 1658     Subjective "I've been busy"    Patient is accompained by: Family member   wife, Carlos Pierce   Currently in Pain? No/denies                   ADULT SLP TREATMENT - 11/28/20 1655       General Information   Behavior/Cognition Alert;Pleasant mood;Cooperative      Treatment Provided   Treatment provided Cognitive-Linquistic      Cognitive-Linquistic Treatment   Treatment focused on Aphasia;Cognition;Patient/family/caregiver education    Skilled Treatment SLP engaged patient in conversation re: weekend events, with consistent mod questioning cues and usual mod to max verbal and written cues required to ID events and items eaten. SLP prompted wife to provide cueing via hierachy, in which pt's wife able to demo after SLP modeling. SLP reviewed descriptions of coins, with pt requiring usual cues to expand upon details. 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 SLP recommended creation of  memory book to aid recall of pertinent information as pt expressed difficulty with naming and recall of family information.      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    Person(s) Educated Patient;Spouse    Methods Explanation;Demonstration;Handout    Comprehension Verbalized understanding;Returned demonstration;Need further instruction              SLP Short Term Goals - 11/28/20 1655       SLP SHORT TERM GOAL #1   Title Pt will verbally generate 5 items in personally relevant categories with occasional min A over 2 sessions    Time 2    Period Weeks    Status On-going      SLP SHORT TERM GOAL #2   Title Pt able to utilize word finding compensations in 5 minute simple conversation with usual min A  over 2 sessions    Time 2    Period Weeks    Status On-going      SLP SHORT TERM GOAL #3   Title Caregivers will  appropriately cue patient when word finding occurs for 4/5 opportunities given rare min A over 2 sessions    Baseline 11-28-20    Time 2    Period Weeks    Status On-going      SLP SHORT TERM GOAL #4   Title Pt will recall and answer simple open/close-ended questions with occasional min A and 2 or less repetitions over 2 sessions    Baseline 11-28-20    Time 2    Period Weeks    Status On-going      SLP SHORT TERM GOAL #5   Title Family will use 2 memory compensations at work/home to aid completion of tasks with occasional min A over 2 sessions    Baseline 11-28-20    Time 2    Period Weeks    Status On-going              SLP Long Term Goals - 11/28/20 1656       SLP LONG TERM GOAL #1   Title 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 and occasional min A  over 2 sessions    Time 6    Period Weeks    Status On-going      SLP LONG TERM GOAL #2   Title Pt able to utilize word finding compensations in 10 minute simple conversation with occasional min A over 2 sessions    Time 6    Period Weeks    Status On-going      SLP LONG TERM GOAL #3   Title Pt/caregivers will implement 3 memory compensations at work/home to aid accurate completion of tasks with rare min A over 2 sessions    Time 6    Period Weeks    Status On-going      SLP LONG TERM GOAL #4   Title Pt/Caregiver will report reduced frustration and improved communication effectiveness via PROM by last ST session    Time 6    Period Weeks    Status On-going              Plan - 11/28/20 1749     Clinical Impression Statement "Carlos Pierce" was referred for OPST intervention to address worsening aphasia and memory related to dementia. Pt required increased usual mod verbal and written cues to facilitate recall and naming of events from yesterday. SLP modeled and prompted wife to use cueing hierarchy to aid word finding. SLP recommended further communication training with son and creation of memory book due to progression of dementia. SLP recommends skilled ST intervention to maximize current cognitive communication skills to improve communication effectiveness and completion of household/work tasks as well as provide caregiver education re: compensations and modifcations as dementia progresses.    Speech Therapy Frequency 2x / week    Duration 8 weeks   or 17 total visits   Treatment/Interventions Compensatory strategies;Cueing hierarchy;Functional tasks;Patient/family education;Cognitive reorganization;Multimodal communcation approach;Language facilitation;Compensatory techniques;Internal/external aids;SLP instruction and feedback    Potential to Achieve Goals Fair    Potential Considerations Ability to learn/carryover information;Previous level of function;Co-morbidities;Severity of  impairments;Medical prognosis    SLP Home Exercise Plan provided    Consulted and Agree with Plan of Care Patient;Family member/caregiver             Patient will benefit from skilled therapeutic intervention in order to improve the following deficits and impairments:   Aphasia  Cognitive communication deficit    Problem List Patient Active Problem List  Diagnosis Date Noted   Obstructive sleep apnea treated with continuous positive airway pressure (CPAP) 01/08/2017   Medication management 07/30/2016   Slow urinary stream 03/21/2016   Hyperlipidemia 06/22/2014   Mild HTN 01/16/2013   HYPERLIPIDEMIA 02/17/2008   HEMORRHOIDS-INTERNAL 02/17/2008   GERD 02/12/2008    Alinda Deem, MA CCC-SLP 11/28/2020, 5:52 PM  [facility] [address] [place], [place], [address] Phone: [phone]   Fax:  [phone]   Name: Carlos Pierce MRN: [id number] Date of Birth: December 28, 1940

## 2020-11-30 ENCOUNTER — Telehealth: Payer: Self-pay

## 2020-11-30 ENCOUNTER — Encounter: Payer: Self-pay | Admitting: Family Medicine

## 2020-11-30 NOTE — Telephone Encounter (Signed)
Pt came to the office this week stating T he received letter in the mail from [facility] stating the Dr needs to respond within 30 days verifying that he still needs the CPAP machine.   We have not received any information on this so I reached out to [employer].  Received message back from [facility] stating  It looks like the letter they sent him was to recertify the O2. I will call him and give him the number to call to get this taken care of.    Fyi

## 2020-12-02 ENCOUNTER — Ambulatory Visit: Payer: Medicare HMO

## 2020-12-02 ENCOUNTER — Other Ambulatory Visit: Payer: Self-pay

## 2020-12-02 DIAGNOSIS — R4701 Aphasia: Secondary | ICD-10-CM

## 2020-12-02 DIAGNOSIS — R41841 Cognitive communication deficit: Secondary | ICD-10-CM

## 2020-12-02 NOTE — Therapy (Signed)
[facility] [address] [place], [place], [address] Phone: [phone]   Fax:  [phone]  Speech Language Pathology Treatment  Patient Details  Name: Carlos Pierce MRN: [id number] Date of Birth: 1940-10-05 Referring Provider (SLP): Nickola Major, MD   Encounter Date: 12/02/2020   End of Session - 12/02/20 0839     Visit Number 7    Number of Visits 17    Date for SLP Re-Evaluation 01/26/21    Authorization Type [employer]    SLP Start Time 0845    SLP Stop Time  0930    SLP Time Calculation (min) 45 min    Activity Tolerance Patient tolerated treatment well             Past Medical History:  Diagnosis Date   Memory loss     Past Surgical History:  Procedure Laterality Date   CATARACT EXTRACTION Bilateral    HEMORRHOID BANDING  2016    There were no vitals filed for this visit.   Subjective Assessment - 12/02/20 0843     Subjective "It's been a little slow"    Patient is accompained by: Family member   wife, Carlos Pierce   Currently in Pain? No/denies                   ADULT SLP TREATMENT - 12/02/20 0839       General Information   Behavior/Cognition Alert;Pleasant mood;Cooperative      Treatment Provided   Treatment provided Cognitive-Linquistic      Cognitive-Linquistic Treatment   Treatment focused on Aphasia;Cognition;Patient/family/caregiver education    Skilled Treatment Pt filled out questionnaire to facilitate conversaton topics for communication/memory book. Usual questioning cues required to provide more specific details on topics due to usual empty speech/fillers. Difficulty with recall of questions/topics noted this session as pt verbalized irrelevant or tangential information unrelated to SLP questions. Occasional repetition and clarification needed for topic maintenance. Pt's wife able to provide additional information and appropriate cues to support pt's  communication.      Assessment / Recommendations / Plan   Plan Continue with current plan of care      Progression Toward Goals   Progression toward goals Progressing toward goals              SLP Education - 12/02/20 1024     Education Details communication/memory book, take pictures to aid communication/recall    Person(s) Educated Patient;Spouse    Methods Explanation;Demonstration;Handout    Comprehension Verbalized understanding;Returned demonstration;Need further instruction              SLP Short Term Goals - 12/02/20 0840       SLP SHORT TERM GOAL #1   Title Pt will verbally generate 5 items in personally relevant categories with occasional min A over 2 sessions    Time 2    Period Weeks    Status On-going      SLP SHORT TERM GOAL #2   Title Pt able to utilize word finding compensations in 5 minute simple conversation with usual min A  over 2 sessions    Time 2    Period Weeks    Status On-going      SLP SHORT TERM GOAL #3   Title Caregivers will appropriately cue patient when word finding occurs for 4/5 opportunities given rare min A over 2 sessions    Baseline 11-28-20    Time 2    Period Weeks  Status On-going      SLP SHORT TERM GOAL #4   Title Pt will recall and answer simple open/close-ended questions with occasional min A and 2 or less repetitions over 2 sessions    Baseline 11-28-20    Time 2    Period Weeks    Status On-going      SLP SHORT TERM GOAL #5   Title Family will use 2 memory compensations at work/home to aid completion of tasks with occasional min A over 2 sessions    Baseline 11-28-20    Time 2    Period Weeks    Status On-going              SLP Long Term Goals - 12/02/20 0840       SLP LONG TERM GOAL #1   Title 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 and occasional min A over 2 sessions    Time 6    Period Weeks    Status On-going      SLP LONG TERM GOAL #2   Title Pt able  to utilize word finding compensations in 10 minute simple conversation with occasional min A over 2 sessions    Time 6    Period Weeks    Status On-going      SLP LONG TERM GOAL #3   Title Pt/caregivers will implement 3 memory compensations at work/home to aid accurate completion of tasks with rare min A over 2 sessions    Time 6    Period Weeks    Status On-going      SLP LONG TERM GOAL #4   Title Pt/Caregiver will report reduced frustration and improved communication effectiveness via PROM by last ST session    Time 6    Period Weeks    Status On-going              Plan - 12/02/20 0840     Clinical Impression Statement "Carlos Pierce" was referred for OPST intervention to address worsening aphasia and memory related to dementia. SLP targeted creation and modification of communication/memory book due to progression of dementia. Usual questioning cues required to expand and clarify information this session. SLP provided occasional prompts for wife to provide semantic/phonemic cue aid patient word finding. SLP recommends skilled ST intervention to maximize current cognitive communication skills to improve communication effectiveness and completion of household/work tasks as well as provide caregiver education re: compensations and modifcations as dementia progresses.    Speech Therapy Frequency 2x / week    Duration 8 weeks   or 17 visits   Treatment/Interventions Compensatory strategies;Cueing hierarchy;Functional tasks;Patient/family education;Cognitive reorganization;Multimodal communcation approach;Language facilitation;Compensatory techniques;Internal/external aids;SLP instruction and feedback    Potential to Achieve Goals Fair    Potential Considerations Ability to learn/carryover information;Previous level of function;Co-morbidities;Severity of impairments;Medical prognosis    SLP Home Exercise Plan provided    Consulted and Agree with Plan of Care Patient;Family member/caregiver     Family Member Consulted wife, Carlos Pierce             Patient will benefit from skilled therapeutic intervention in order to improve the following deficits and impairments:   Aphasia  Cognitive communication deficit    Problem List Patient Active Problem List   Diagnosis Date Noted   Obstructive sleep apnea treated with continuous positive airway pressure (CPAP) 01/08/2017   Medication management 07/30/2016   Slow urinary stream 03/21/2016   Hyperlipidemia 06/22/2014   Mild HTN 01/16/2013  HYPERLIPIDEMIA 02/17/2008   HEMORRHOIDS-INTERNAL 02/17/2008   GERD 02/12/2008    Alinda Deem, MA CCC-SLP 12/02/2020, 10:25 AM  [facility] [address] [place], [place], [address] Phone: [phone]   Fax:  [phone]   Name: Carlos Pierce MRN: [id number] Date of Birth: July 30, 1940

## 2020-12-06 ENCOUNTER — Other Ambulatory Visit: Payer: Self-pay

## 2020-12-06 ENCOUNTER — Ambulatory Visit: Payer: Medicare HMO

## 2020-12-06 DIAGNOSIS — R4701 Aphasia: Secondary | ICD-10-CM

## 2020-12-06 DIAGNOSIS — R41841 Cognitive communication deficit: Secondary | ICD-10-CM

## 2020-12-06 NOTE — Therapy (Signed)
[facility] [address] [place], [place], [address] Phone: [phone]   Fax:  [phone]  Speech Language Pathology Treatment  Patient Details  Name: Carlos Pierce MRN: [id number] Date of Birth: 1940-06-19 Referring Provider (SLP): Nickola Major, MD   Encounter Date: 12/06/2020   End of Session - 12/06/20 1805     Visit Number 8    Number of Visits 17    Date for SLP Re-Evaluation 01/26/21    Authorization Type [employer]    SLP Start Time 1703    SLP Stop Time  1758    SLP Time Calculation (min) 55 min    Activity Tolerance Patient tolerated treatment well             Past Medical History:  Diagnosis Date   Memory loss     Past Surgical History:  Procedure Laterality Date   CATARACT EXTRACTION Bilateral    HEMORRHOID BANDING  2016    There were no vitals filed for this visit.   Subjective Assessment - 12/06/20 1704     Subjective pulled out homework    Patient is accompained by: Family member    Currently in Pain? No/denies                   ADULT SLP TREATMENT - 12/06/20 1704       General Information   Behavior/Cognition Alert;Pleasant mood;Cooperative      Treatment Provided   Treatment provided Cognitive-Linquistic      Cognitive-Linquistic Treatment   Treatment focused on Aphasia;Cognition;Patient/family/caregiver education    Skilled Treatment Pt's wife reported today was a rough day for patient. Increased word finding difficulty and reduced recall indicated in evening time, in which SLP provided education and recommendations to focus communication opportunities in morning if possbile. 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 SLP prompted further verbal descriptions with usual mod questioning cues required to provide additional details. Pt's wife often provided additional first letter or phonemic cues to provide  specific details.      Assessment / Recommendations / Plan   Plan Continue with current plan of care      Progression Toward Goals   Progression toward goals Progressing toward goals              SLP Education - 12/06/20 1807     Education Details HEP, functional Catering manager for phone    Person(s) Educated Patient;Spouse    Methods Explanation;Demonstration;Handout    Comprehension Verbalized understanding;Returned demonstration;Need further instruction              SLP Short Term Goals - 12/06/20 1805       SLP SHORT TERM GOAL #1   Title Pt will verbally generate 5 items in personally relevant categories with occasional min A over 2 sessions    Baseline 12-06-20    Time 1    Period Weeks    Status On-going      SLP SHORT TERM GOAL #2   Title Pt able to utilize word finding compensations in 5 minute simple conversation with usual min A  over 2 sessions    Time 1    Period Weeks    Status On-going      SLP SHORT TERM GOAL #3   Title Caregivers will appropriately cue patient when word finding occurs for 4/5 opportunities given rare min A over 2 sessions    Baseline 11-28-20, 12-06-20  Time 1    Status Achieved      SLP SHORT TERM GOAL #4   Title Pt will recall and answer simple open/close-ended questions with occasional min A and 2 or less repetitions over 2 sessions    Baseline 11-28-20    Time 1    Period Weeks    Status On-going      SLP SHORT TERM GOAL #5   Title Family will use 2 memory compensations at work/home to aid completion of tasks with occasional min A over 2 sessions    Baseline 11-28-20    Time 1    Period Weeks    Status On-going              SLP Long Term Goals - 12/06/20 1806       SLP LONG TERM GOAL #1   Title 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 and occasional min A over 2 sessions    Time 5    Period Weeks    Status On-going      SLP LONG TERM GOAL #2   Title Pt able to  utilize word finding compensations in 10 minute simple conversation with occasional min A over 2 sessions    Time 5    Period Weeks    Status On-going      SLP LONG TERM GOAL #3   Title Pt/caregivers will implement 3 memory compensations at work/home to aid accurate completion of tasks with rare min A over 2 sessions    Time 5    Period Weeks    Status On-going      SLP LONG TERM GOAL #4   Title Pt/Caregiver will report reduced frustration and improved communication effectiveness via PROM by last ST session    Time 5    Period Weeks    Status On-going              Plan - 12/06/20 1806     Clinical Impression Statement "[name]" was referred for OPST intervention to address worsening aphasia and memory related to dementia. SLP targeted modification of communication/memory book due to progression of dementia. Usual questioning cues required to expand and clarify information this session. SLP provided occasional prompts for wife to provide semantic/phonemic cue aid patient word finding. Pt benefited from visual aids to facilitate further description of items. SLP recommends skilled ST intervention to maximize current cognitive communication skills to improve communication effectiveness and completion of household/work tasks as well as provide caregiver education re: compensations and modifcations as dementia progresses.    Speech Therapy Frequency 2x / week    Duration 8 weeks   or 17 visits   Treatment/Interventions Compensatory strategies;Cueing hierarchy;Functional tasks;Patient/family education;Cognitive reorganization;Multimodal communcation approach;Language facilitation;Compensatory techniques;Internal/external aids;SLP instruction and feedback    Potential to Achieve Goals Fair    Potential Considerations Ability to learn/carryover information;Previous level of function;Co-morbidities;Severity of impairments;Medical prognosis    SLP Home Exercise Plan provided    Consulted and Agree  with Plan of Care Patient;Family member/caregiver    Family Member Consulted wife, Susie             Patient will benefit from skilled therapeutic intervention in order to improve the following deficits and impairments:   Aphasia  Cognitive communication deficit    Problem List Patient Active Problem List   Diagnosis Date Noted   Obstructive sleep apnea treated with continuous positive airway pressure (CPAP) 01/08/2017   Medication management 07/30/2016   Slow  urinary stream 03/21/2016   Hyperlipidemia 06/22/2014   Mild HTN 01/16/2013   HYPERLIPIDEMIA 02/17/2008   HEMORRHOIDS-INTERNAL 02/17/2008   GERD 02/12/2008    Alinda Deem, MA CCC-SLP 12/06/2020, 6:14 PM  [facility] [address] [place], [place], [address] Phone: [phone]   Fax:  [phone]   Name: NASEEM VARDEN MRN: [id number] Date of Birth: 1940/07/20

## 2020-12-20 ENCOUNTER — Ambulatory Visit: Payer: Medicare HMO | Attending: Family Medicine

## 2020-12-20 ENCOUNTER — Other Ambulatory Visit: Payer: Self-pay

## 2020-12-20 DIAGNOSIS — R4701 Aphasia: Secondary | ICD-10-CM | POA: Insufficient documentation

## 2020-12-20 DIAGNOSIS — R41841 Cognitive communication deficit: Secondary | ICD-10-CM | POA: Insufficient documentation

## 2020-12-20 NOTE — Therapy (Signed)
[facility] [address] [place], [place], [address] Phone: [phone]   Fax:  [phone]  Speech Language Pathology Treatment  Patient Details  Name: Carlos Pierce MRN: [id number] Date of Birth: 10-03-1940 Referring Provider (SLP): Nickola Major, MD   Encounter Date: 12/20/2020   End of Session - 12/20/20 1650     Visit Number 9    Number of Visits 17    Date for SLP Re-Evaluation 01/26/21    Authorization Type [employer]    SLP Start Time 1655    SLP Stop Time  1740    SLP Time Calculation (min) 45 min    Activity Tolerance Patient tolerated treatment well             Past Medical History:  Diagnosis Date   Memory loss     Past Surgical History:  Procedure Laterality Date   CATARACT EXTRACTION Bilateral    HEMORRHOID BANDING  2016    There were no vitals filed for this visit.   Subjective Assessment - 12/20/20 1653     Subjective "it was a good vacation"    Patient is accompained by: Family member    Currently in Pain? No/denies                   ADULT SLP TREATMENT - 12/20/20 1650       General Information   Behavior/Cognition Alert;Pleasant mood;Cooperative      Treatment Provided   Treatment provided Cognitive-Linquistic      Cognitive-Linquistic Treatment   Treatment focused on Aphasia;Cognition;Patient/family/caregiver education    Skilled Treatment Pt recently went on vacation to visit family in [place]. Pt able to name activities with intermittent max verbal and visual cues. Written cues were rarely effective this session. 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 SLP recommended patient use "cheat sheet" and business cards when pt experiences difficulty naming or recalling at work. SLP reviewed previous HEP, with pt's wife reporting  difficulty with naming chart due to excess information. SLP recommended using visual markers to help pt attend to one column or row at a time.      Assessment / Recommendations / Plan   Plan Continue with current plan of care      Progression Toward Goals   Progression toward goals Progressing toward goals              SLP Education - 12/20/20 1841     Education Details store naming/recall "cheat sheet," using business cards to recall/name, HEP    Person(s) Educated Patient;Spouse    Methods Explanation;Demonstration;Handout    Comprehension Verbalized understanding;Returned demonstration;Need further instruction              SLP Short Term Goals - 12/20/20 1650       SLP SHORT TERM GOAL #1   Title Pt will verbally generate 5 items in personally relevant categories with occasional min A over 2 sessions    Baseline 12-06-20    Status Partially Met      SLP SHORT TERM GOAL #2   Title Pt able to utilize word finding compensations in 5 minute simple conversation with usual min A  over 2 sessions    Baseline 12-20-20    Status Partially Met      SLP SHORT TERM GOAL #3   Title Caregivers will appropriately cue patient when word  finding occurs for 4/5 opportunities given rare min A over 2 sessions    Baseline 11-28-20, 12-06-20    Status Achieved      SLP SHORT TERM GOAL #4   Title Pt will recall and answer simple open/close-ended questions with occasional min A and 2 or less repetitions over 2 sessions    Baseline 11-28-20    Status Partially Met      SLP SHORT TERM GOAL #5   Title Family will use 2 memory compensations at work/home to aid completion of tasks with occasional min A over 2 sessions    Baseline 11-28-20    Status Partially Met              SLP Long Term Goals - 12/20/20 1652       SLP LONG TERM GOAL #1   Title 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 and occasional min A over 2 sessions    Time 4    Period Weeks     Status On-going      SLP LONG TERM GOAL #2   Title Pt able to utilize word finding compensations in 10 minute simple conversation with occasional min A over 2 sessions    Time 4    Period Weeks    Status On-going      SLP LONG TERM GOAL #3   Title Pt/caregivers will implement 3 memory compensations at work/home to aid accurate completion of tasks with rare min A over 2 sessions    Time 4    Period Weeks    Status On-going      SLP LONG TERM GOAL #4   Title Pt/Caregiver will report reduced frustration and improved communication effectiveness via PROM by last ST session    Time 4    Period Weeks    Status On-going              Plan - 12/20/20 1842     Clinical Impression Statement "Carlos Pierce" was referred for OPST intervention to address worsening aphasia and memory related to dementia. SLP targeted modification of "cheat sheet" for work to aid naming and recall due to progression of dementia. Usual questioning cues required to expand and clarify information this session due to anomia and empty speech. SLP provided occasional prompts for wife to provide semantic/phonemic cue aid patient word finding. SLP recommends skilled ST intervention to maximize current cognitive communication skills to improve communication effectiveness and completion of household/work tasks as well as provide caregiver education re: compensations and modifcations as dementia progresses.    Speech Therapy Frequency 2x / week    Duration 8 weeks   or 17 visits   Treatment/Interventions Compensatory strategies;Cueing hierarchy;Functional tasks;Patient/family education;Cognitive reorganization;Multimodal communcation approach;Language facilitation;Compensatory techniques;Internal/external aids;SLP instruction and feedback    Potential to Achieve Goals Fair    Potential Considerations Ability to learn/carryover information;Previous level of function;Co-morbidities;Severity of impairments;Medical prognosis    SLP  Home Exercise Plan provided    Consulted and Agree with Plan of Care Patient;Family member/caregiver    Family Member Consulted wife, Susie             Patient will benefit from skilled therapeutic intervention in order to improve the following deficits and impairments:   Aphasia  Cognitive communication deficit    Problem List Patient Active Problem List   Diagnosis Date Noted   Obstructive sleep apnea treated with continuous positive airway pressure (CPAP) 01/08/2017   Medication management 07/30/2016   Slow   urinary stream 03/21/2016   Hyperlipidemia 06/22/2014   Mild HTN 01/16/2013   HYPERLIPIDEMIA 02/17/2008   HEMORRHOIDS-INTERNAL 02/17/2008   GERD 02/12/2008     V , MA CCC-SLP 12/20/2020, 6:43 PM  [facility] [address] [place], [place], [address] Phone: [phone]   Fax:  [phone]   Name: Devantae M Hernandes MRN: [id number] Date of Birth: 05/02/1941  

## 2020-12-23 ENCOUNTER — Other Ambulatory Visit: Payer: Self-pay

## 2020-12-23 ENCOUNTER — Ambulatory Visit: Payer: Medicare HMO

## 2020-12-23 DIAGNOSIS — R4701 Aphasia: Secondary | ICD-10-CM | POA: Diagnosis not present

## 2020-12-23 DIAGNOSIS — R41841 Cognitive communication deficit: Secondary | ICD-10-CM

## 2020-12-23 NOTE — Therapy (Signed)
[facility] [address] [place], [place], [address] Phone: [phone]   Fax:  [phone]  Speech Language Pathology Treatment/Progress Note  Patient Details  Name: Carlos Pierce MRN: [id number] Date of Birth: 1940-12-10 Referring Provider (SLP): Nickola Major, MD   Encounter Date: 12/23/2020   End of Session - 12/23/20 1409     Visit Number 10    Number of Visits 17    Date for SLP Re-Evaluation 01/26/21    Authorization Type [employer]    SLP Start Time 0850    SLP Stop Time  0930    SLP Time Calculation (min) 40 min    Activity Tolerance Patient tolerated treatment well             Past Medical History:  Diagnosis Date   Memory loss     Past Surgical History:  Procedure Laterality Date   CATARACT EXTRACTION Bilateral    HEMORRHOID BANDING  2016    There were no vitals filed for this visit.    Speech Therapy Progress Note  Dates of Reporting Period: 10-28-20 to present  Subjective Statement: Pt has been seen for 10 visits for aphsia and memory  Objective: See below  Goal Update: See below  Plan: See below  Reason Skilled Services are Required: Pt has not yet maximzed full potential, wife's education not yet complete.       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 He req'd cues to tell me landmarks for the store to tell people on the phone - but pt stated he does not take phone calls unless to  put people on hold. He used compensatory strategies of circumlocution and synonym for 10 minutes of simple conversation with SLP - min questioning cues were necessary for SLP to fully understand pt's message, rarely. During this conversation pt abandoned anomic episodes.      Assessment / Recommendations / Plan   Plan Continue with current plan of care      Progression Toward Goals   Progression toward goals Progressing toward goals                SLP Short Term Goals - 12/20/20 1650       SLP SHORT TERM GOAL #1   Title Pt will verbally generate 5 items in personally relevant categories with occasional min A over 2 sessions    Baseline 12-06-20    Status Partially Met      SLP SHORT TERM GOAL #2   Title Pt able to utilize word finding compensations in 5 minute simple conversation with usual min A  over 2 sessions    Baseline 12-20-20    Status Partially Met      SLP SHORT TERM GOAL #3   Title Caregivers will appropriately cue patient when word finding occurs for 4/5 opportunities given rare min A over 2 sessions    Baseline 11-28-20, 12-06-20    Status Achieved      SLP SHORT TERM GOAL #4   Title Pt will recall  and answer simple open/close-ended questions with occasional min A and 2 or less repetitions over 2 sessions    Baseline 11-28-20    Status Partially Met      SLP SHORT TERM GOAL #5   Title Family will use 2 memory compensations at work/home to aid completion of tasks with occasional min A over 2 sessions    Baseline 11-28-20    Status Partially Met              SLP Long Term Goals - 12/23/20 0901       SLP LONG TERM GOAL #1   Title 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 and occasional min A over 2 sessions    Time 4    Period Weeks    Status On-going      SLP LONG TERM GOAL #2   Title Pt able to utilize word finding compensations in 10 minute simple conversation with occasional min A over 2 sessions    Time 4     Period Weeks    Status On-going      SLP LONG TERM GOAL #3   Title Pt/caregivers will implement 3 memory compensations at work/home to aid accurate completion of tasks with rare min A over 2 sessions    Time 4    Period Weeks    Status On-going      SLP LONG TERM GOAL #4   Title Pt/Caregiver will report reduced frustration and improved communication effectiveness via PROM by last ST session    Time 4    Period Weeks    Status On-going              Plan - 12/23/20 1409     Clinical Impression Statement "Carlos Pierce" was referred for OPST intervention to address worsening aphasia and memory related to dementia. SLP targeted modification of "cheat sheet" for work to aid naming and recall due to progression of dementia. SLP questioning cues required to assist Carlos Pierce to expand and clarify information this session due to anomia and empty speech. SLP recommends skilled ST intervention to maximize current cognitive communication skills to improve communication effectiveness and completion of household/work tasks as well as provide caregiver education re: compensations and modifcations as dementia progresses.    Speech Therapy Frequency 2x / week    Duration 8 weeks   or 17 visits   Treatment/Interventions Compensatory strategies;Cueing hierarchy;Functional tasks;Patient/family education;Cognitive reorganization;Multimodal communcation approach;Language facilitation;Compensatory techniques;Internal/external aids;SLP instruction and feedback    Potential to Achieve Goals Fair    Potential Considerations Ability to learn/carryover information;Previous level of function;Co-morbidities;Severity of impairments;Medical prognosis    SLP Home Exercise Plan provided    Consulted and Agree with Plan of Care Patient;Family member/caregiver    Family Member Consulted wife, Susie             Patient will benefit from skilled therapeutic intervention in order to improve the following deficits and impairments:    Aphasia  Cognitive communication deficit    Problem List Patient Active Problem List   Diagnosis Date Noted   Obstructive sleep apnea treated with continuous positive airway pressure (CPAP) 01/08/2017   Medication management 07/30/2016   Slow urinary stream 03/21/2016   Hyperlipidemia 06/22/2014   Mild HTN 01/16/2013   HYPERLIPIDEMIA 02/17/2008   HEMORRHOIDS-INTERNAL 02/17/2008   GERD 02/12/2008    Tallie Hevia ,MS, CCC-SLP  12/23/2020, 2:11 PM  [facility] [address] [place], [place], [address] Phone:  [phone]   Fax:  [phone]   Name: JAMA KRICHBAUM MRN: [id number] Date of Birth: 12/02/1940

## 2020-12-27 ENCOUNTER — Ambulatory Visit: Payer: Medicare HMO

## 2020-12-27 ENCOUNTER — Other Ambulatory Visit: Payer: Self-pay

## 2020-12-27 DIAGNOSIS — R41841 Cognitive communication deficit: Secondary | ICD-10-CM

## 2020-12-27 DIAGNOSIS — R4701 Aphasia: Secondary | ICD-10-CM

## 2020-12-27 NOTE — Therapy (Signed)
[facility] [address] [place], [place], [address] Phone: [phone]   Fax:  [phone]  Speech Language Pathology Treatment  Patient Details  Name: Carlos Pierce MRN: [id number] Date of Birth: Feb 14, 1941 Referring Provider (SLP): Nickola Major, MD   Encounter Date: 12/27/2020   End of Session - 12/27/20 1651     Visit Number 11    Number of Visits 17    Date for SLP Re-Evaluation 01/26/21    Authorization Type [employer]    SLP Start Time 1655    SLP Stop Time  1740    SLP Time Calculation (min) 45 min    Activity Tolerance Patient tolerated treatment well             Past Medical History:  Diagnosis Date   Memory loss     Past Surgical History:  Procedure Laterality Date   CATARACT EXTRACTION Bilateral    HEMORRHOID BANDING  2016    There were no vitals filed for this visit.   Subjective Assessment - 12/27/20 1652     Subjective "good"    Currently in Pain? No/denies                   ADULT SLP TREATMENT - 12/27/20 1651       General Information   Behavior/Cognition Alert;Pleasant mood;Cooperative      Treatment Provided   Treatment provided Cognitive-Linquistic      Cognitive-Linquistic Treatment   Treatment focused on Cognition;Aphasia;Patient/family/caregiver education    Skilled Treatment SLP engaged patient in conversations re: recent events and topics of interest for communication support book. Frequent episodes of empty speech exhibited this session, requiring usual mod to max questioning cues to specifiy information. SLP targeted naming of additional family details, in which pt's wife provided usual max phonemic cues to name pertient information related to family members. SLP addressed avoidance/abandoning words when word finding occurs. SLP recommended pt's wife provide word then patient repeat and/or cue use of anomia compensations as previously  educated.      Assessment / Recommendations / Plan   Plan Continue with current plan of care      Progression Toward Goals   Progression toward goals Progressing toward goals              SLP Education - 12/27/20 2111     Education Details techniques to reduce avoidance for words/abandoning words, communication support book    Person(s) Educated Patient;Spouse    Methods Explanation;Demonstration;Handout    Comprehension Verbalized understanding;Returned demonstration;Need further instruction              SLP Short Term Goals - 12/20/20 1650       SLP SHORT TERM GOAL #1   Title Pt will verbally generate 5 items in personally relevant categories with occasional min A over 2 sessions    Baseline 12-06-20    Status Partially Met      SLP SHORT TERM GOAL #2   Title Pt able to utilize word finding compensations in 5 minute simple conversation with usual min A  over 2 sessions    Baseline 12-20-20    Status Partially Met      SLP SHORT TERM GOAL #3   Title Caregivers will appropriately cue patient when word finding occurs for 4/5 opportunities given rare min A over 2 sessions    Baseline 11-28-20, 12-06-20    Status Achieved      SLP SHORT TERM GOAL #4  Title Pt will recall and answer simple open/close-ended questions with occasional min A and 2 or less repetitions over 2 sessions    Baseline 11-28-20    Status Partially Met      SLP SHORT TERM GOAL #5   Title Family will use 2 memory compensations at work/home to aid completion of tasks with occasional min A over 2 sessions    Baseline 11-28-20    Status Partially Met              SLP Long Term Goals - 12/27/20 1652       SLP LONG TERM GOAL #1   Title 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 and occasional min A over 2 sessions    Time 3    Period Weeks    Status On-going      SLP LONG TERM GOAL #2   Title Pt able to utilize word finding compensations in 10 minute  simple conversation with occasional min A over 2 sessions    Time 3    Period Weeks    Status On-going      SLP LONG TERM GOAL #3   Title Pt/caregivers will implement 3 memory compensations at work/home to aid accurate completion of tasks with rare min A over 2 sessions    Time 3    Period Weeks    Status On-going      SLP LONG TERM GOAL #4   Title Pt/Caregiver will report reduced frustration and improved communication effectiveness via PROM by last ST session    Time 3    Period Weeks    Status On-going              Plan - 12/27/20 1652     Clinical Impression Statement "Carlos Pierce" was referred for OPST intervention to address worsening aphasia and memory related to dementia. SLP targeted modification of communication support book due to increasing difficulty with naming and recall of personal information. SLP provided usual mod to max questioning cues to assist Carlos Pierce expand and clarify information due to anomia and empty speech. SLP recommends skilled ST intervention to maximize current cognitive communication skills to improve communication effectiveness and completion of household/work tasks as well as provide caregiver education re: compensations and modifcations as dementia progresses.    Speech Therapy Frequency 2x / week    Duration 8 weeks    Treatment/Interventions Compensatory strategies;Cueing hierarchy;Functional tasks;Patient/family education;Cognitive reorganization;Multimodal communcation approach;Language facilitation;Compensatory techniques;Internal/external aids;SLP instruction and feedback    Potential to Achieve Goals Fair    Potential Considerations Ability to learn/carryover information;Previous level of function;Co-morbidities;Severity of impairments;Medical prognosis    SLP Home Exercise Plan provided    Consulted and Agree with Plan of Care Patient;Family member/caregiver    Family Member Consulted wife, Susie             Patient will benefit from skilled  therapeutic intervention in order to improve the following deficits and impairments:   Aphasia  Cognitive communication deficit    Problem List Patient Active Problem List   Diagnosis Date Noted   Obstructive sleep apnea treated with continuous positive airway pressure (CPAP) 01/08/2017   Medication management 07/30/2016   Slow urinary stream 03/21/2016   Hyperlipidemia 06/22/2014   Mild HTN 01/16/2013   HYPERLIPIDEMIA 02/17/2008   HEMORRHOIDS-INTERNAL 02/17/2008   GERD 02/12/2008    Alinda Deem, MA CCC-SLP 12/27/2020, 9:14 PM  [facility] [address] [place], [place],  [address] Phone: [phone]   Fax:  [phone]   Name: Carlos Pierce MRN: [id number] Date of Birth: [birth year]

## 2020-12-29 ENCOUNTER — Ambulatory Visit: Payer: Medicare HMO

## 2020-12-29 ENCOUNTER — Other Ambulatory Visit: Payer: Self-pay

## 2020-12-29 DIAGNOSIS — R4701 Aphasia: Secondary | ICD-10-CM

## 2020-12-29 DIAGNOSIS — R41841 Cognitive communication deficit: Secondary | ICD-10-CM

## 2020-12-29 NOTE — Therapy (Signed)
[facility] [address] [place], [place], [address] Phone: [phone]   Fax:  [phone]  Speech Language Pathology Treatment  Patient Details  Name: Carlos Pierce MRN: [id number] Date of Birth: 1941-05-17 Referring Provider (SLP): Nickola Major, MD   Encounter Date: 12/29/2020   End of Session - 12/29/20 1649     Visit Number 12    Number of Visits 17    Date for SLP Re-Evaluation 01/26/21    Authorization Type [employer]    SLP Start Time 2035    SLP Stop Time  1735    SLP Time Calculation (min) 45 min    Activity Tolerance Patient tolerated treatment well             Past Medical History:  Diagnosis Date   Memory loss     Past Surgical History:  Procedure Laterality Date   CATARACT EXTRACTION Bilateral    HEMORRHOID BANDING  2016    There were no vitals filed for this visit.   Subjective Assessment - 12/29/20 1650     Subjective "It was good despite the rain" re: work    Currently in Pain? No/denies                   ADULT SLP TREATMENT - 12/29/20 1649       General Information   Behavior/Cognition Alert;Pleasant mood;Cooperative      Treatment Provided   Treatment provided Cognitive-Linquistic      Cognitive-Linquistic Treatment   Treatment focused on Cognition;Aphasia;Patient/family/caregiver education    Skilled Treatment SLP targeted modifcation and review of communication support book. Pt able to ID 1-3 items at home for 3 categories. SLP provided max questioning cues to ID additional information related to favorite trips, places, and foods. SLP and wife provided max semantic, written, and visual cues to aid word finding this session, which was occasionally effective.      Assessment / Recommendations / Plan   Plan Continue with current plan of care      Progression Toward Goals   Progression toward goals Progressing toward goals               SLP Education - 12/29/20 1741     Education Details communication support book, HEP    Person(s) Educated Patient;Spouse    Methods Explanation;Demonstration;Handout    Comprehension Verbalized understanding;Returned demonstration;Need further instruction              SLP Short Term Goals - 12/20/20 1650       SLP SHORT TERM GOAL #1   Title Pt will verbally generate 5 items in personally relevant categories with occasional min A over 2 sessions    Baseline 12-06-20    Status Partially Met      SLP SHORT TERM GOAL #2   Title Pt able to utilize word finding compensations in 5 minute simple conversation with usual min A  over 2 sessions    Baseline 12-20-20    Status Partially Met      SLP SHORT TERM GOAL #3   Title Caregivers will appropriately cue patient when word finding occurs for 4/5 opportunities given rare min A over 2 sessions    Baseline 11-28-20, 12-06-20    Status Achieved      SLP SHORT TERM GOAL #4   Title Pt will recall and answer simple open/close-ended questions with occasional min A and 2 or less repetitions over 2 sessions    Baseline 11-28-20  Status Partially Met      SLP SHORT TERM GOAL #5   Title Family will use 2 memory compensations at work/home to aid completion of tasks with occasional min A over 2 sessions    Baseline 11-28-20    Status Partially Met              SLP Long Term Goals - 12/29/20 1650       SLP LONG TERM GOAL #1   Title 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 and occasional min A over 2 sessions    Time 3    Period Weeks    Status On-going      SLP LONG TERM GOAL #2   Title Pt able to utilize word finding compensations in 10 minute simple conversation with occasional min A over 2 sessions    Time 3    Period Weeks    Status On-going      SLP LONG TERM GOAL #3   Title Pt/caregivers will implement 3 memory compensations at work/home to aid accurate completion of tasks with rare min A  over 2 sessions    Time 3    Period Weeks    Status On-going      SLP LONG TERM GOAL #4   Title Pt/Caregiver will report reduced frustration and improved communication effectiveness via PROM by last ST session    Time 3    Period Weeks    Status On-going              Plan - 12/29/20 1741     Clinical Impression Statement "Carlos Pierce" was referred for OPST intervention to address worsening aphasia and memory related to dementia. SLP targeted modification of communication support book due to increasing difficulty with recall of personal information. SLP provided usual mod to max questioning cues to assist Carlos Pierce expand and clarify information due to anomia and empty speech. SLP recommends skilled ST intervention to maximize current cognitive communication skills to improve communication effectiveness and completion of household/work tasks as well as provide caregiver education re: compensations and modifcations as dementia progresses.    Speech Therapy Frequency 2x / week    Duration 8 weeks    Treatment/Interventions Compensatory strategies;Cueing hierarchy;Functional tasks;Patient/family education;Cognitive reorganization;Multimodal communcation approach;Language facilitation;Compensatory techniques;Internal/external aids;SLP instruction and feedback    Potential Considerations Ability to learn/carryover information;Previous level of function;Co-morbidities;Severity of impairments;Medical prognosis    SLP Home Exercise Plan provided    Consulted and Agree with Plan of Care Patient;Family member/caregiver    Family Member Consulted wife, Susie             Patient will benefit from skilled therapeutic intervention in order to improve the following deficits and impairments:   Aphasia  Cognitive communication deficit    Problem List Patient Active Problem List   Diagnosis Date Noted   Obstructive sleep apnea treated with continuous positive airway pressure (CPAP) 01/08/2017    Medication management 07/30/2016   Slow urinary stream 03/21/2016   Hyperlipidemia 06/22/2014   Mild HTN 01/16/2013   HYPERLIPIDEMIA 02/17/2008   HEMORRHOIDS-INTERNAL 02/17/2008   GERD 02/12/2008    Alinda Deem, MA CCC-SLP 12/29/2020, 5:42 PM  [facility] [address] [place], [place], [address] Phone: [phone]   Fax:  [phone]   Name: QUINTRELL BAZE MRN: [id number] Date of Birth: Nov 28, 1940

## 2021-01-03 ENCOUNTER — Ambulatory Visit: Payer: Medicare HMO

## 2021-01-06 ENCOUNTER — Other Ambulatory Visit: Payer: Self-pay

## 2021-01-06 ENCOUNTER — Ambulatory Visit: Payer: Medicare HMO

## 2021-01-06 DIAGNOSIS — R4701 Aphasia: Secondary | ICD-10-CM

## 2021-01-06 DIAGNOSIS — R41841 Cognitive communication deficit: Secondary | ICD-10-CM

## 2021-01-06 NOTE — Therapy (Signed)
[facility] [address] [place], [place], [address] Phone: [phone]   Fax:  [phone]  Speech Language Pathology Treatment  Patient Details  Name: Carlos Pierce MRN: [id number] Date of Birth: 1941-05-10 Referring Provider (SLP): Nickola Major, MD   Encounter Date: 01/06/2021   End of Session - 01/06/21 1815     Visit Number 13    Number of Visits 17    Date for SLP Re-Evaluation 01/26/21    Authorization Type [employer]    SLP Start Time 0847    SLP Stop Time  0930    SLP Time Calculation (min) 43 min    Activity Tolerance Patient tolerated treatment well             Past Medical History:  Diagnosis Date   Memory loss     Past Surgical History:  Procedure Laterality Date   CATARACT EXTRACTION Bilateral    HEMORRHOID BANDING  2016    There were no vitals filed for this visit.   Subjective Assessment - 01/06/21 0848     Subjective "it's been slow (at work)"    Currently in Pain? No/denies                   ADULT SLP TREATMENT - 01/06/21 1613       General Information   Behavior/Cognition Alert;Pleasant mood;Cooperative      Treatment Provided   Treatment provided Cognitive-Linquistic      Cognitive-Linquistic Treatment   Treatment focused on Cognition;Aphasia;Patient/family/caregiver education    Skilled Treatment Pt is still driving to/from work, with no concerns reported by wife. No accidents or missed turns reported. SLP discussed usual roads taken, in which pt unable to name main highway or current address. SLP trialed max verbal and visual cues to aid naming, which were minimally effective. SLP targeted recall and naming of pertinent biographical information. Pt able to ID birthdate without cues. Pt verbalized awareness to look at driver's license if he forgets address. SLP suggested putting aphasia card in wallet and writing down wife's phone number and  emergeny number to keep in wallet. Both patient and his wife verbalized agreement.      Assessment / Recommendations / Plan   Plan Continue with current plan of care      Progression Toward Goals   Progression toward goals Progressing toward goals              SLP Education - 01/06/21 1814     Education Details external memory aids, aphasia card    Person(s) Educated Patient;Spouse    Methods Demonstration;Explanation    Comprehension Returned demonstration;Verbalized understanding;Need further instruction              SLP Short Term Goals - 12/20/20 1650       SLP SHORT TERM GOAL #1   Title Pt will verbally generate 5 items in personally relevant categories with occasional min A over 2 sessions    Baseline 12-06-20    Status Partially Met      SLP SHORT TERM GOAL #2   Title Pt able to utilize word finding compensations in 5 minute simple conversation with usual min A  over 2 sessions    Baseline 12-20-20    Status Partially Met      SLP SHORT TERM GOAL #3   Title Caregivers will appropriately cue patient when word finding occurs for 4/5 opportunities given rare min A over 2 sessions    Baseline  11-28-20, 12-06-20    Status Achieved      SLP SHORT TERM GOAL #4   Title Pt will recall and answer simple open/close-ended questions with occasional min A and 2 or less repetitions over 2 sessions    Baseline 11-28-20    Status Partially Met      SLP SHORT TERM GOAL #5   Title Family will use 2 memory compensations at work/home to aid completion of tasks with occasional min A over 2 sessions    Baseline 11-28-20    Status Partially Met              SLP Long Term Goals - 01/06/21 1819       SLP LONG TERM GOAL #1   Title 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 and occasional min A over 2 sessions    Time 2    Period Weeks    Status On-going      SLP LONG TERM GOAL #2   Title Pt able to utilize word finding compensations in  10 minute simple conversation with occasional min A over 2 sessions    Time 2    Period Weeks    Status On-going      SLP LONG TERM GOAL #3   Title Pt/caregivers will implement 3 memory compensations at work/home to aid accurate completion of tasks with rare min A over 2 sessions    Time 2    Period Weeks    Status On-going      SLP LONG TERM GOAL #4   Title Pt/Caregiver will report reduced frustration and improved communication effectiveness via PROM by last ST session    Time 2    Period Weeks    Status On-going              Plan - 01/06/21 1820     Clinical Impression Statement "Carlos Pierce" was referred for OPST intervention to address worsening aphasia and memory related to dementia. SLP targeted use of external aids to facilitate naming and recall due to increased difficulty with recall of personal information. SLP provided usual mod to max verbal and visual cues to assist Carlos Pierce expand and clarify information due to anomia and empty speech. SLP recommends skilled ST intervention to maximize current cognitive communication skills to improve communication effectiveness and completion of household/work tasks as well as provide caregiver education re: compensations and modifcations as dementia progresses.    Speech Therapy Frequency 2x / week    Duration 8 weeks    Treatment/Interventions Compensatory strategies;Cueing hierarchy;Functional tasks;Patient/family education;Cognitive reorganization;Multimodal communcation approach;Language facilitation;Compensatory techniques;Internal/external aids;SLP instruction and feedback    Potential to Achieve Goals Fair    Potential Considerations Ability to learn/carryover information;Previous level of function;Co-morbidities;Severity of impairments;Medical prognosis    SLP Home Exercise Plan provided    Consulted and Agree with Plan of Care Patient;Family member/caregiver    Family Member Consulted wife, Susie             Patient will benefit  from skilled therapeutic intervention in order to improve the following deficits and impairments:   Aphasia  Cognitive communication deficit    Problem List Patient Active Problem List   Diagnosis Date Noted   Obstructive sleep apnea treated with continuous positive airway pressure (CPAP) 01/08/2017   Medication management 07/30/2016   Slow urinary stream 03/21/2016   Hyperlipidemia 06/22/2014   Mild HTN 01/16/2013   HYPERLIPIDEMIA 02/17/2008   HEMORRHOIDS-INTERNAL 02/17/2008   GERD 02/12/2008  Alinda Deem, MA CCC-SLP 01/06/2021, 6:23 PM  [facility] [address] [place], [place], [address] Phone: [phone]   Fax:  [phone]   Name: Carlos Pierce MRN: [id number] Date of Birth: 04-Dec-1940

## 2021-01-10 ENCOUNTER — Ambulatory Visit: Payer: Medicare HMO | Attending: Family Medicine

## 2021-01-10 ENCOUNTER — Other Ambulatory Visit: Payer: Self-pay

## 2021-01-10 DIAGNOSIS — R41841 Cognitive communication deficit: Secondary | ICD-10-CM | POA: Diagnosis present

## 2021-01-10 DIAGNOSIS — R4701 Aphasia: Secondary | ICD-10-CM | POA: Diagnosis present

## 2021-01-10 NOTE — Therapy (Signed)
[facility] [address] [place], [place], [address] Phone: [phone]   Fax:  [phone]  Speech Language Pathology Treatment  Patient Details  Name: Carlos Pierce MRN: [id number] Date of Birth: [birth year] Referring Provider (SLP): Nickola Major, MD   Encounter Date: 01/10/2021   End of Session - 01/10/21 1650     Visit Number 14    Number of Visits 17    Date for SLP Re-Evaluation 01/26/21    Authorization Type [employer]    SLP Start Time 1655    SLP Stop Time  1740    SLP Time Calculation (min) 45 min    Activity Tolerance Patient tolerated treatment well             Past Medical History:  Diagnosis Date   Memory loss     Past Surgical History:  Procedure Laterality Date   CATARACT EXTRACTION Bilateral    HEMORRHOID BANDING  2016    There were no vitals filed for this visit.   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 Pt's son scheduled to come next session to identify different ways to help patient utilize cognitive communication skills with supervision and assistance as needed. SLP provided aphasia card to keep in wallet as pt is still driving. SLP provided recommendations to optimize recall and safety while driving.      Assessment / Recommendations / Plan   Plan  Continue with current plan of care      Progression Toward Goals   Progression toward goals Progressing toward goals              SLP Education - 01/10/21 1744     Education Details functional communication practice at work, aphasia card    [employer]) Educated Patient;Spouse    Methods Explanation;Demonstration;Handout    Comprehension Verbalized understanding;Returned demonstration;Need further instruction              SLP Short Term Goals - 12/20/20 1650       SLP SHORT TERM GOAL #1   Title Pt will verbally generate 5 items in personally relevant categories with occasional min A over 2 sessions    Baseline 12-06-20    Status Partially Met      SLP SHORT TERM GOAL #2   Title Pt able to utilize word finding compensations in 5 minute simple conversation with usual min A  over 2 sessions    Baseline 12-20-20    Status Partially Met      SLP SHORT TERM GOAL #3   Title Caregivers will appropriately cue patient when word finding occurs for 4/5 opportunities given rare min A over 2 sessions    Baseline 11-28-20, 12-06-20    Status Achieved      SLP SHORT TERM GOAL #4   Title Pt will recall and answer simple open/close-ended  questions with occasional min A and 2 or less repetitions over 2 sessions    Baseline 11-28-20    Status Partially Met      SLP SHORT TERM GOAL #5   Title Family will use 2 memory compensations at work/home to aid completion of tasks with occasional min A over 2 sessions    Baseline 11-28-20    Status Partially Met              SLP Long Term Goals - 01/10/21 1651       SLP LONG TERM GOAL #1   Title 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 and occasional min A over 2 sessions    Time 1    Period Weeks    Status On-going      SLP LONG TERM GOAL #2   Title Pt able to utilize word finding compensations in 10 minute simple conversation with occasional min A over 2 sessions    Time 1    Period Weeks    Status  On-going      SLP LONG TERM GOAL #3   Title Pt/caregivers will implement 3 memory compensations at work/home to aid accurate completion of tasks with rare min A over 2 sessions    Time 1    Period Weeks    Status On-going      SLP LONG TERM GOAL #4   Title Pt/Caregiver will report reduced frustration and improved communication effectiveness via PROM by last ST session    Time 1    Period Weeks    Status On-going              Plan - 01/10/21 1651     Clinical Impression Statement "Carlos Pierce" was referred for OPST intervention to address worsening aphasia and memory related to dementia. SLP targeted use of external aids to facilitate naming and recall due to increased difficulty with recall of personal information. SLP provided usual mod to max verbal and visual cues to assist Carlos Pierce expand and clarify information due to anomia and empty speech. SLP recommends skilled ST intervention to maximize current cognitive communication skills to improve communication effectiveness and completion of household/work tasks as well as provide caregiver education re: compensations and modifcations as dementia progresses.    Speech Therapy Frequency 2x / week    Duration 8 weeks    Treatment/Interventions Compensatory strategies;Cueing hierarchy;Functional tasks;Patient/family education;Cognitive reorganization;Multimodal communcation approach;Language facilitation;Compensatory techniques;Internal/external aids;SLP instruction and feedback    Potential to Achieve Goals Fair    Potential Considerations Ability to learn/carryover information;Previous level of function;Co-morbidities;Severity of impairments;Medical prognosis    SLP Home Exercise Plan provided    Consulted and Agree with Plan of Care Patient;Family member/caregiver    Family Member Consulted wife, Susie             Patient will benefit from skilled therapeutic intervention in order to improve the following deficits and impairments:    Aphasia  Cognitive communication deficit    Problem List Patient Active Problem List   Diagnosis Date Noted   Obstructive sleep apnea treated with continuous positive airway pressure (CPAP) 01/08/2017   Medication management 07/30/2016   Slow urinary stream 03/21/2016   Hyperlipidemia 06/22/2014   Mild HTN 01/16/2013   HYPERLIPIDEMIA 02/17/2008   HEMORRHOIDS-INTERNAL 02/17/2008   GERD 02/12/2008    Alinda Deem, MA CCC-SLP 01/10/2021, 5:45 PM  [facility] [address] [place], [place], [address] Phone: [phone]  Fax:  [phone]   Name: ANIRUDH BAIZ MRN: [id number] Date of Birth: 03-26-1941

## 2021-01-11 ENCOUNTER — Ambulatory Visit: Payer: Medicare HMO

## 2021-01-11 DIAGNOSIS — R41841 Cognitive communication deficit: Secondary | ICD-10-CM

## 2021-01-11 DIAGNOSIS — R4701 Aphasia: Secondary | ICD-10-CM

## 2021-01-11 NOTE — Therapy (Signed)
[facility] [address] [place], [place], [address] Phone: [phone]   Fax:  [phone]  Speech Language Pathology Treatment  Patient Details  Name: Carlos Pierce MRN: [id number] Date of Birth: 12/29/1940 Referring Provider (SLP): Carlos Major, MD   Encounter Date: 01/11/2021   End of Session - 01/11/21 1813     Visit Number 15    Number of Visits 17    Date for SLP Re-Evaluation 01/26/21    Authorization Type [employer]    SLP Start Time 1720    SLP Stop Time  1805    SLP Time Calculation (min) 45 min    Activity Tolerance Patient tolerated treatment well             Past Medical History:  Diagnosis Date   Memory loss     Past Surgical History:  Procedure Laterality Date   CATARACT EXTRACTION Bilateral    HEMORRHOID BANDING  2016    There were no vitals filed for this visit.   Subjective Assessment - 01/11/21 1816     Subjective "this is my son"    Patient is accompained by: Family member   wife & son   Currently in Pain? No/denies                   ADULT SLP TREATMENT - 01/11/21 1809       General Information   Behavior/Cognition Alert;Pleasant mood;Cooperative      Treatment Provided   Treatment provided Cognitive-Linquistic      Cognitive-Linquistic Treatment   Treatment focused on Cognition;Aphasia;Patient/family/caregiver education    Skilled Treatment Carlos Pierce's son, Carlos Pierce, accompanied him and his wife this session. SLP provided recommendations to family on how to engage and support pt's communication and memory at work to maximize current skills and optimize verbal output. All verbalized understanding. 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 SLP re-educated use of  communication support book and provided training of how to locate information. Occasional mod to max verbal and visual cues required to locate specific information given. Pt's wife to provide pictures to be added into communication book. SLP educated patient on using communication book often for familiarization and recall purposes.      Assessment / Recommendations / Plan   Plan Continue with current plan of care      Progression Toward Goals   Progression toward goals Progressing toward goals              SLP Education - 01/11/21 1808     Education Details communication support book, how to involve Carlos Pierce at work to support his [employer]) Educated Patient;Spouse;Child(ren)    Methods Explanation;Demonstration;Handout    Comprehension Verbalized understanding;Returned demonstration;Need further instruction              SLP Short Term Goals - 12/20/20 1650       SLP SHORT TERM GOAL #1   Title Pt will verbally generate 5 items in personally relevant categories with occasional min A over 2 sessions    Baseline 12-06-20    Status Partially Met      SLP SHORT TERM GOAL #2   Title Pt able to utilize word finding compensations in 5 minute simple conversation with usual min A  over 2 sessions  Baseline 12-20-20    Status Partially Met      SLP SHORT TERM GOAL #3   Title Caregivers will appropriately cue patient when word finding occurs for 4/5 opportunities given rare min A over 2 sessions    Baseline 11-28-20, 12-06-20    Status Achieved      SLP SHORT TERM GOAL #4   Title Pt will recall and answer simple open/close-ended questions with occasional min A and 2 or less repetitions over 2 sessions    Baseline 11-28-20    Status Partially Met      SLP SHORT TERM GOAL #5   Title Family will use 2 memory compensations at work/home to aid completion of tasks with occasional min A over 2 sessions    Baseline 11-28-20    Status Partially Met              SLP Long  Term Goals - 01/11/21 1814       SLP LONG TERM GOAL #1   Title 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 and occasional min A over 2 sessions    Baseline 01-11-21    Time 1    Period Weeks    Status On-going      SLP LONG TERM GOAL #2   Title Pt able to utilize word finding compensations in 10 minute simple conversation with occasional min A over 2 sessions    Baseline 01-11-21    Time 1    Period Weeks    Status On-going      SLP LONG TERM GOAL #3   Title Pt/caregivers will implement 3 memory compensations at work/home to aid accurate completion of tasks with rare min A over 2 sessions    Time 1    Period Weeks    Status On-going      SLP LONG TERM GOAL #4   Title Pt/Caregiver will report reduced frustration and improved communication effectiveness via PROM by last ST session    Time 1    Period Weeks    Status On-going              Plan - 01/11/21 1814     Clinical Impression Statement "Carlos Pierce" was referred for OPST intervention to address worsening aphasia and memory related to dementia. SLP targeted use of communication aid to facilitate naming and recall due to increased difficulty with recall of personal information. SLP provided usual mod to max verbal and visual cues to locate and identify information. SLP reviewed recommendations with wife and son re: how to support cognitive communication at work to maximize current skills. SLP recommends skilled ST intervention to maximize current cognitive communication skills to improve communication effectiveness and completion of household/work tasks as well as provide caregiver education re: compensations and modifcations as dementia progresses.    Speech Therapy Frequency 2x / week    Duration 8 weeks    Treatment/Interventions Compensatory strategies;Cueing hierarchy;Functional tasks;Patient/family education;Cognitive reorganization;Multimodal communcation approach;Language  facilitation;Compensatory techniques;Internal/external aids;SLP instruction and feedback    Potential to Achieve Goals Fair    Potential Considerations Ability to learn/carryover information;Previous level of function;Co-morbidities;Severity of impairments;Medical prognosis    SLP Home Exercise Plan provided    Consulted and Agree with Plan of Care Patient;Family member/caregiver             Patient will benefit from skilled therapeutic intervention in order to improve the following deficits and impairments:   Aphasia  Cognitive communication deficit    Problem  List Patient Active Problem List   Diagnosis Date Noted   Obstructive sleep apnea treated with continuous positive airway pressure (CPAP) 01/08/2017   Medication management 07/30/2016   Slow urinary stream 03/21/2016   Hyperlipidemia 06/22/2014   Mild HTN 01/16/2013   HYPERLIPIDEMIA 02/17/2008   HEMORRHOIDS-INTERNAL 02/17/2008   GERD 02/12/2008    Alinda Deem, MA CCC-SLP 01/11/2021, 6:16 PM  [facility] [address] [place], [place], [address] Phone: [phone]   Fax:  [phone]   Name: Carlos Pierce MRN: [id number] Date of Birth: [birth year]

## 2021-01-17 ENCOUNTER — Ambulatory Visit: Payer: Medicare HMO

## 2021-01-20 ENCOUNTER — Other Ambulatory Visit: Payer: Self-pay

## 2021-01-20 ENCOUNTER — Ambulatory Visit: Payer: Medicare HMO

## 2021-01-20 DIAGNOSIS — R4701 Aphasia: Secondary | ICD-10-CM

## 2021-01-20 DIAGNOSIS — R41841 Cognitive communication deficit: Secondary | ICD-10-CM

## 2021-01-20 NOTE — Therapy (Signed)
[facility] [address] [place], [place], [address] Phone: [phone]   Fax:  [phone]  Speech Language Pathology Treatment  Patient Details  Name: Carlos Pierce MRN: [id number] Date of Birth: 1941/02/07 Referring Provider (SLP): Nickola Major, MD   Encounter Date: 01/20/2021   End of Session - 01/20/21 0833     Visit Number 16    Number of Visits 17    Date for SLP Re-Evaluation 01/26/21    Authorization Type [employer]    SLP Start Time 0840    SLP Stop Time  0925    SLP Time Calculation (min) 45 min    Activity Tolerance Patient tolerated treatment well             Past Medical History:  Diagnosis Date   Memory loss     Past Surgical History:  Procedure Laterality Date   CATARACT EXTRACTION Bilateral    HEMORRHOID BANDING  2016    There were no vitals filed for this visit.   Subjective Assessment - 01/20/21 0835     Subjective "I feel better"    Patient is accompained by: Family member   wife                  ADULT SLP TREATMENT - 01/20/21 0833       General Information   Behavior/Cognition Alert;Pleasant mood;Cooperative      Treatment Provided   Treatment provided Cognitive-Linquistic      Cognitive-Linquistic Treatment   Treatment focused on Cognition;Aphasia;Patient/family/caregiver education    Skilled Treatment Pt and wife reported recent positive improvement in speech and memory related since initiation of ST intervention. 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 SLP assessed patient ability to effectively use aid, in which pt able to ID coin and price with 80% accuracy independently, which improved to 100% accuracy given min A. SLP targeted pt providing description of coin, in which occasional min to mod questioning cues required. Intermittent anomia x4, in which pt benefited from intermittent  semantic/phonemic cues and extended time. Pt's wife provided scrapbooks this session, in which SLP provided recommendations to use memory books often to engage memory and communication. Both verbalized understanding.      Assessment / Recommendations / Plan   Plan Continue with current plan of care      Progression Toward Goals   Progression toward goals Progressing toward goals                SLP Short Term Goals - 12/20/20 1650       SLP SHORT TERM GOAL #1   Title Pt will verbally generate 5 items in personally relevant categories with occasional min A over 2 sessions    Baseline 12-06-20    Status Partially Met      SLP SHORT TERM GOAL #2   Title Pt able to utilize word finding compensations in 5 minute simple conversation with usual min A  over 2 sessions    Baseline 12-20-20    Status Partially Met      SLP SHORT TERM GOAL #3   Title Caregivers will appropriately cue patient when word finding occurs for 4/5 opportunities given rare min A over 2 sessions    Baseline 11-28-20, 12-06-20    Status Achieved      SLP SHORT TERM GOAL #4   Title Pt will recall and answer simple open/close-ended questions with occasional min A and  2 or less repetitions over 2 sessions    Baseline 11-28-20    Status Partially Met      SLP SHORT TERM GOAL #5   Title Family will use 2 memory compensations at work/home to aid completion of tasks with occasional min A over 2 sessions    Baseline 11-28-20    Status Partially Met              SLP Long Term Goals - 01/20/21 0834       SLP LONG TERM GOAL #1   Title 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 and occasional min A over 2 sessions    Baseline 01-11-21    Time 1    Period Weeks    Status On-going      SLP LONG TERM GOAL #2   Title Pt able to utilize word finding compensations in 10 minute simple conversation with occasional min A over 2 sessions    Baseline 01-11-21, 01-20-21    Time --    Period  --    Status Achieved      SLP LONG TERM GOAL #3   Title Pt/caregivers will implement 3 memory compensations at work/home to aid accurate completion of tasks with rare min A over 2 sessions    Baseline 01-20-21    Time 1    Period Weeks    Status On-going      SLP LONG TERM GOAL #4   Title Pt/Caregiver will report reduced frustration and improved communication effectiveness via PROM by last ST session    Time 1    Period Weeks    Status On-going              Plan - 01/20/21 0834     Clinical Impression Statement "[name]" was referred for OPST intervention to address worsening aphasia and memory related to dementia. SLP targeted use of communication aids to facilitate naming and recall due to increased difficulty with communication of personal information and information related to business. SLP provided intermittent min to mod verbal and visual cues to locate and identify information. SLP reviewed recommendations with wife re: how to support cognitive communication skills at work to maximize current skills. SLP recommends skilled ST intervention to maximize current cognitive communication skills to improve communication effectiveness and completion of household/work tasks as well as provide caregiver education re: compensations and modifcations as dementia progresses.    Speech Therapy Frequency 2x / week    Duration 8 weeks    Treatment/Interventions Compensatory strategies;Cueing hierarchy;Functional tasks;Patient/family education;Cognitive reorganization;Multimodal communcation approach;Language facilitation;Compensatory techniques;Internal/external aids;SLP instruction and feedback    Potential to Achieve Goals Fair    Potential Considerations Ability to learn/carryover information;Previous level of function;Co-morbidities;Severity of impairments;Medical prognosis    SLP Home Exercise Plan provided    Consulted and Agree with Plan of Care Patient;Family member/caregiver    Family  Member Consulted wife, [name]             Patient will benefit from skilled therapeutic intervention in order to improve the following deficits and impairments:   Aphasia  Cognitive communication deficit    Problem List Patient Active Problem List   Diagnosis Date Noted   Obstructive sleep apnea treated with continuous positive airway pressure (CPAP) 01/08/2017   Medication management 07/30/2016   Slow urinary stream 03/21/2016   Hyperlipidemia 06/22/2014   Mild HTN 01/16/2013   HYPERLIPIDEMIA 02/17/2008   HEMORRHOIDS-INTERNAL 02/17/2008   GERD 02/12/2008    Belenda Cruise  Deirdre Pippins, MA CCC-SLP 01/20/2021, 9:49 AM  [facility] [address] [place], [place], [address] Phone: [phone]   Fax:  [phone]   Name: Carlos Pierce MRN: [id number] Date of Birth: [birth year]

## 2021-01-27 ENCOUNTER — Other Ambulatory Visit: Payer: Self-pay

## 2021-01-27 ENCOUNTER — Ambulatory Visit: Payer: Medicare HMO

## 2021-01-27 DIAGNOSIS — R41841 Cognitive communication deficit: Secondary | ICD-10-CM

## 2021-01-27 DIAGNOSIS — R4701 Aphasia: Secondary | ICD-10-CM

## 2021-01-27 NOTE — Therapy (Signed)
[facility] [address] [place], [place], [address] Phone: [phone]   Fax:  [phone]  Speech Language Pathology Treatment/Recertification  Patient Details  Name: Carlos Pierce MRN: [id number] Date of Birth: 08-22-1940 Referring Provider (SLP): Carlos Major, MD   Encounter Date: 01/27/2021   End of Session - 01/27/21 1113     Visit Number 17    Number of Visits 25    Date for SLP Re-Evaluation 03/10/21   written for 6 weeks due to scheduling conflicts (anticipate only 8 more ST sessions)   Authorization Type [employer]    SLP Start Time 0845    SLP Stop Time  0932    SLP Time Calculation (min) 47 min    Activity Tolerance Patient tolerated treatment well             Past Medical History:  Diagnosis Date   Memory loss     Past Surgical History:  Procedure Laterality Date   CATARACT EXTRACTION Bilateral    HEMORRHOID BANDING  2016    There were no vitals filed for this visit.   Subjective Assessment - 01/27/21 0850     Subjective "it (work) was slow this week"    Patient is accompained by: Family member   wife, Carlos Pierce   Currently in Pain? No/denies                   ADULT SLP TREATMENT - 01/27/21 0856       General Information   Behavior/Cognition Alert;Pleasant mood;Cooperative      Treatment Provided   Treatment provided Cognitive-Linquistic      Cognitive-Linquistic Treatment   Treatment focused on Cognition;Aphasia;Patient/family/caregiver education    Skilled Treatment Pt engaged in conversation re: how he is involved at work, particularly on slower days. Intermittent clarification and mod questioning cues to ID concrete information. Pt demonstrated use of anomia compensations with rare min A, including synonyms, circumlocution, and description. Usual min to mod cues required to name people/items within family memory books provided this session. Family  requested additional ST intervention as pt is making some gains in communication.      Assessment / Recommendations / Plan   Plan Continue with current plan of care;Goals updated      Progression Toward Goals   Progression toward goals Progressing toward goals              SLP Education - 01/27/21 1113     Education Details functional tasks at home to addressed    Person(s) Educated Patient;Spouse    Methods Explanation;Demonstration;Handout    Comprehension Verbalized understanding;Returned demonstration;Need further instruction              SLP Short Term Goals - 12/20/20 1650       SLP SHORT TERM GOAL #1   Title Pt will verbally generate 5 items in personally relevant categories with occasional min A over 2 sessions    Baseline 12-06-20    Status Partially Met      SLP SHORT TERM GOAL #2   Title Pt able to utilize word finding compensations in 5 minute simple conversation with usual min A  over 2 sessions    Baseline 12-20-20    Status Partially Met      SLP SHORT TERM GOAL #3   Title Caregivers will appropriately cue patient when word finding occurs for 4/5 opportunities given rare min A over 2 sessions    Baseline 11-28-20, 12-06-20  Status Achieved      SLP SHORT TERM GOAL #4   Title Pt will recall and answer simple open/close-ended questions with occasional min A and 2 or less repetitions over 2 sessions    Baseline 11-28-20    Status Partially Met      SLP SHORT TERM GOAL #5   Title Family will use 2 memory compensations at work/home to aid completion of tasks with occasional min A over 2 sessions    Baseline 11-28-20    Status Partially Met              SLP Long Term Goals - 01/27/21 0846       SLP LONG TERM GOAL #1   Title 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 and occasional min A over 2 sessions    Baseline 01-11-21    Status Partially Met      SLP LONG TERM GOAL #2   Title Pt able to utilize word  finding compensations in 10 minute simple conversation with occasional min A over 2 sessions    Baseline 01-11-21, 01-20-21    Status Achieved      SLP LONG TERM GOAL #3   Title Pt/caregivers will implement 3 memory compensations at work/home to aid accurate completion of tasks with rare min A over 2 sessions    Baseline 01-20-21, 01-27-21    Status Achieved      SLP LONG TERM GOAL #4   Title Pt/Caregiver will report reduced frustration and improved communication effectiveness via PROM by 2 points last ST session    Time 6    Period Weeks    Status Revised    Target Date 03/10/21      SLP LONG TERM GOAL #5   Title Pt will utilize communication supports in conversation to aid word finding and recall deficits for 4/5 opportunities given occasional mod A over 2 sessions    Time 6    Period Weeks    Status New    Target Date 03/10/21      Additional Long Term Goals   Additional Long Term Goals Yes      SLP LONG TERM GOAL #6   Title Pt/caregiver will ID 3 communication scenarios to engage pt's naming and recall skills after ST discharge    Time 6    Period Weeks    Status New    Target Date 03/10/21              Plan - 01/27/21 1117     Clinical Impression Statement "Carlos Pierce" was referred for OPST intervention to address worsening aphasia and memory related to dementia. SLP targeted use of communication aids to facilitate naming and recall with occasional to usual cues required to name and recall specifics within family photo book. SLP reviewed recommendations with wife re: how to support cognitive communication skills at home and work to maximize current skills. POC updated to continue 2x/week for 6 weeks (anticipate 8 more sessions) to optimize current cognitive communciation skills for maintenance and provide caregiver education re: compensations and modifcations as dementia progresses.    Speech Therapy Frequency 2x / week    Duration Other (comment)   6 more weeks due to scheduling  (anticipate d/c after 8 more sessions)   Treatment/Interventions Compensatory strategies;Cueing hierarchy;Functional tasks;Patient/family education;Cognitive reorganization;Multimodal communcation approach;Language facilitation;Compensatory techniques;Internal/external aids;SLP instruction and feedback    Potential to Achieve Goals Fair    Potential Considerations Ability to learn/carryover information;Previous level  of function;Co-morbidities;Severity of impairments;Medical prognosis    SLP Home Exercise Plan provided    Consulted and Agree with Plan of Care Patient;Family member/caregiver    Family Member Consulted wife, Carlos Pierce             Patient will benefit from skilled therapeutic intervention in order to improve the following deficits and impairments:   Aphasia  Cognitive communication deficit    Problem List Patient Active Problem List   Diagnosis Date Noted   Obstructive sleep apnea treated with continuous positive airway pressure (CPAP) 01/08/2017   Medication management 07/30/2016   Slow urinary stream 03/21/2016   Hyperlipidemia 06/22/2014   Mild HTN 01/16/2013   HYPERLIPIDEMIA 02/17/2008   HEMORRHOIDS-INTERNAL 02/17/2008   GERD 02/12/2008    Alinda Deem, MA CCC-SLP 01/27/2021, 11:35 AM  [facility] [address] [place], [place], [address] Phone: [phone]   Fax:  [phone]   Name: Carlos Pierce MRN: [id number] Date of Birth: 1941/03/05

## 2021-01-31 ENCOUNTER — Other Ambulatory Visit: Payer: Self-pay

## 2021-01-31 ENCOUNTER — Ambulatory Visit: Payer: Medicare HMO

## 2021-01-31 DIAGNOSIS — R4701 Aphasia: Secondary | ICD-10-CM | POA: Diagnosis not present

## 2021-01-31 DIAGNOSIS — R41841 Cognitive communication deficit: Secondary | ICD-10-CM

## 2021-01-31 NOTE — Therapy (Signed)
[facility] [address] [place], [place], [address] Phone: [phone]   Fax:  [phone]  Speech Language Pathology Treatment  Patient Details  Name: Carlos Pierce MRN: [id number] Date of Birth: 05-18-1941 Referring Provider (SLP): Nickola Major, MD   Encounter Date: 01/31/2021   End of Session - 01/31/21 1646     Visit Number 18    Number of Visits 25    Date for SLP Re-Evaluation 03/10/21    Authorization Type [employer]    SLP Start Time 5686    SLP Stop Time  1735    SLP Time Calculation (min) 45 min    Activity Tolerance Patient tolerated treatment well             Past Medical History:  Diagnosis Date   Memory loss     Past Surgical History:  Procedure Laterality Date   CATARACT EXTRACTION Bilateral    HEMORRHOID BANDING  2016    There were no vitals filed for this visit.   Subjective Assessment - 01/31/21 1650     Subjective "great" pulled out homework    Currently in Pain? No/denies                   ADULT SLP TREATMENT - 01/31/21 1646       General Information   Behavior/Cognition Alert;Pleasant mood;Cooperative      Treatment Provided   Treatment provided Cognitive-Linquistic      Cognitive-Linquistic Treatment   Treatment focused on Cognition;Aphasia;Patient/family/caregiver education    Skilled Treatment Pt reportedly completed naming HEP independently without need for assistance. Pt used three choices on tasks to fill in blanks and generated responses to sentence starters. In conversation, pt exhibited occasional anomia with use of description strategy x1. Usual mod to max A required to aid anomia. SLP targeted alphabetical naming task, with occasional min to max cues required to name next letter and words.      Assessment / Recommendations / Plan   Plan Continue with current plan of care      Progression Toward Goals   Progression toward goals  Progressing toward goals              SLP Education - 01/31/21 1823     Education Details HEP    Person(s) Educated Patient;Spouse    Methods Explanation;Demonstration;Handout    Comprehension Verbalized understanding;Returned demonstration;Need further instruction              SLP Short Term Goals - 12/20/20 1650       SLP SHORT TERM GOAL #1   Title Pt will verbally generate 5 items in personally relevant categories with occasional min A over 2 sessions    Baseline 12-06-20    Status Partially Met      SLP SHORT TERM GOAL #2   Title Pt able to utilize word finding compensations in 5 minute simple conversation with usual min A  over 2 sessions    Baseline 12-20-20    Status Partially Met      SLP SHORT TERM GOAL #3   Title Caregivers will appropriately cue patient when word finding occurs for 4/5 opportunities given rare min A over 2 sessions    Baseline 11-28-20, 12-06-20    Status Achieved      SLP SHORT TERM GOAL #4   Title Pt will recall and answer simple open/close-ended questions with occasional min A and 2 or less repetitions over 2 sessions  Baseline 11-28-20    Status Partially Met      SLP SHORT TERM GOAL #5   Title Family will use 2 memory compensations at work/home to aid completion of tasks with occasional min A over 2 sessions    Baseline 11-28-20    Status Partially Met              SLP Long Term Goals - 01/31/21 1647       SLP LONG TERM GOAL #1   Title 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 and occasional min A over 2 sessions    Baseline 01-11-21    Status Partially Met      SLP LONG TERM GOAL #2   Title Pt able to utilize word finding compensations in 10 minute simple conversation with occasional min A over 2 sessions    Baseline 01-11-21, 01-20-21    Status Achieved      SLP LONG TERM GOAL #3   Title Pt/caregivers will implement 3 memory compensations at work/home to aid accurate completion of tasks  with rare min A over 2 sessions    Baseline 01-20-21, 01-27-21    Status Achieved      SLP LONG TERM GOAL #4   Title Pt/Caregiver will report reduced frustration and improved communication effectiveness via PROM by 2 points last ST session    Time 6    Period Weeks    Status On-going    Target Date 03/10/21      SLP LONG TERM GOAL #5   Title Pt will utilize communication supports in conversation to aid word finding and recall deficits for 4/5 opportunities given occasional mod A over 2 sessions    Time 6    Period Weeks    Status On-going    Target Date 03/10/21      SLP LONG TERM GOAL #6   Title Pt/caregiver will ID 3 communication scenarios to engage pt's naming and recall skills after ST discharge    Time 6    Period Weeks    Status On-going    Target Date 03/10/21              Plan - 01/31/21 1646     Clinical Impression Statement "Carlos Pierce" was referred for OPST intervention to address worsening aphasia and memory related to dementia. SLP targeted naming in response to letters, as pt exerienced difficulty wiht first letter cues this session. Occasional min to mod A required to aid naming alphabet and corresponding words. SLP reviewed recommendations with wife re: how to support cognitive communication skills at home and work to maximize current skills. Skilled ST is warranted to optimize current cognitive communciation skills for maintenance and provide caregiver education re: compensations and modifcations as dementia progresses.    Speech Therapy Frequency 2x / week    Duration --   6 more weeks due to scheduling (anticipate d/c after 8 sessions)   Treatment/Interventions Compensatory strategies;Cueing hierarchy;Functional tasks;Patient/family education;Cognitive reorganization;Multimodal communcation approach;Language facilitation;Compensatory techniques;Internal/external aids;SLP instruction and feedback    Potential to Achieve Goals Fair    Potential Considerations Ability  to learn/carryover information;Previous level of function;Co-morbidities;Severity of impairments;Medical prognosis    SLP Home Exercise Plan provided    Consulted and Agree with Plan of Care Patient;Family member/caregiver    Family Member Consulted wife, [name]             Patient will benefit from skilled therapeutic intervention in order to improve the following deficits and  impairments:   Aphasia  Cognitive communication deficit    Problem List Patient Active Problem List   Diagnosis Date Noted   Obstructive sleep apnea treated with continuous positive airway pressure (CPAP) 01/08/2017   Medication management 07/30/2016   Slow urinary stream 03/21/2016   Hyperlipidemia 06/22/2014   Mild HTN 01/16/2013   HYPERLIPIDEMIA 02/17/2008   HEMORRHOIDS-INTERNAL 02/17/2008   GERD 02/12/2008    Alinda Deem, MA CCC-SLP 01/31/2021, 6:26 PM  [facility] [address] [place], [place], [address] Phone: [phone]   Fax:  [phone]   Name: Carlos Pierce MRN: [id number] Date of Birth: 1941-05-08

## 2021-02-02 ENCOUNTER — Other Ambulatory Visit: Payer: Self-pay

## 2021-02-02 ENCOUNTER — Ambulatory Visit: Payer: Medicare HMO

## 2021-02-02 DIAGNOSIS — R41841 Cognitive communication deficit: Secondary | ICD-10-CM

## 2021-02-02 DIAGNOSIS — R4701 Aphasia: Secondary | ICD-10-CM

## 2021-02-03 NOTE — Therapy (Signed)
[facility] [address] [place], [place], [address] Phone: [phone]   Fax:  [phone]  Speech Language Pathology Treatment  Patient Details  Name: Carlos Pierce MRN: [id number] Date of Birth: December 06, 1940 Referring Provider (SLP): Nickola Major, MD   Encounter Date: 02/02/2021   End of Session - 02/02/21 1752     Visit Number 19    Number of Visits 25    Date for SLP Re-Evaluation 03/10/21    Authorization Type [employer]    SLP Start Time 1745    SLP Stop Time  1950    SLP Time Calculation (min) 45 min    Activity Tolerance Patient tolerated treatment well             Past Medical History:  Diagnosis Date   Memory loss     Past Surgical History:  Procedure Laterality Date   CATARACT EXTRACTION Bilateral    HEMORRHOID BANDING  2016    There were no vitals filed for this visit.   Subjective Assessment - 02/02/21 1748     Subjective "I'm tired"    Patient is accompained by: Family member   wife, [name]   Currently in Pain? No/denies                   ADULT SLP TREATMENT - 02/02/21 1749       General Information   Behavior/Cognition Alert;Pleasant mood;Cooperative      Treatment Provided   Treatment provided Cognitive-Linquistic      Cognitive-Linquistic Treatment   Treatment focused on Cognition;Aphasia;Patient/family/caregiver education    Skilled Treatment Pt reported feeling tired this session. SLP targeted naming related to usual shopping ventures at [employer] [employer]. Usual mod phonemic and semantic cues required to aid naming of items x5. Independent use of description strategy and gestures intermittently exhibited in conversation and naming tasks. Increased difficulty naming reported for HEP to ID male and male names as pt usually has difficulty with names. Pt's wife reports pt does better when provided options versus finding targeted words. SLP provided education to  patient and wife re: HEP and pt how benefits from options due to progression of dementia and aphasia. SLP emphasized these conditions are expected to progress and provided recommendedations to support current cognitive communication with expectation of future skills. Both verbalized understanding.      Assessment / Recommendations / Plan   Plan Continue with current plan of care      Progression Toward Goals   Progression toward goals Progressing toward goals              SLP Education - 02/03/21 1036     Education Details progressive nature of dementia and associated aphasia, functional cognitive communication skills    Person(s) Educated Patient;Spouse    Methods Explanation;Demonstration;Handout    Comprehension Verbalized understanding;Returned demonstration;Need further instruction              SLP Short Term Goals - 12/20/20 1650       SLP SHORT TERM GOAL #1   Title Pt will verbally generate 5 items in personally relevant categories with occasional min A over 2 sessions    Baseline 12-06-20    Status Partially Met      SLP SHORT TERM GOAL #2   Title Pt able to utilize word finding compensations in 5 minute simple conversation with usual min A  over 2 sessions    Baseline 12-20-20    Status Partially Met  SLP SHORT TERM GOAL #3   Title Caregivers will appropriately cue patient when word finding occurs for 4/5 opportunities given rare min A over 2 sessions    Baseline 11-28-20, 12-06-20    Status Achieved      SLP SHORT TERM GOAL #4   Title Pt will recall and answer simple open/close-ended questions with occasional min A and 2 or less repetitions over 2 sessions    Baseline 11-28-20    Status Partially Met      SLP SHORT TERM GOAL #5   Title Family will use 2 memory compensations at work/home to aid completion of tasks with occasional min A over 2 sessions    Baseline 11-28-20    Status Partially Met              SLP Long Term Goals - 02/02/21 1758        SLP LONG TERM GOAL #1   Title 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 and occasional min A over 2 sessions    Baseline 01-11-21    Status Partially Met      SLP LONG TERM GOAL #2   Title Pt able to utilize word finding compensations in 10 minute simple conversation with occasional min A over 2 sessions    Baseline 01-11-21, 01-20-21    Status Achieved      SLP LONG TERM GOAL #3   Title Pt/caregivers will implement 3 memory compensations at work/home to aid accurate completion of tasks with rare min A over 2 sessions    Baseline 01-20-21, 01-27-21    Status Achieved      SLP LONG TERM GOAL #4   Title Pt/Caregiver will report reduced frustration and improved communication effectiveness via PROM by 2 points last ST session    Time 6    Period Weeks    Status On-going    Target Date 03/10/21      SLP LONG TERM GOAL #5   Title Pt will utilize communication supports in conversation to aid word finding and recall deficits for 4/5 opportunities given occasional mod A over 2 sessions    Time 6    Period Weeks    Status On-going    Target Date 03/10/21      SLP LONG TERM GOAL #6   Title Pt/caregiver will ID 3 communication scenarios to engage pt's naming and recall skills after ST discharge    Time 6    Period Weeks    Status On-going    Target Date 03/10/21              Plan - 02/02/21 1753     Clinical Impression Statement "Carlos Pierce" was referred for OPST intervention to address worsening aphasia and memory related to dementia. SLP targeted functional naming tasks, with usual mod  semantic and phonemic cues required to aid naming this session. SLP reviewed recommendations with patient and wife re: how to support current cognitive communication skills at home as well as explained how given the progressive nature of dementia, communication and cognition will need further support from family in the future. Skilled ST is warranted to optimize current  cognitive communciation skills for maintenance and provide caregiver education re: compensations and modifcations as dementia progresses.    Speech Therapy Frequency 2x / week    Duration --   6 more weeks due to scheduling (anticipate d/c after 8 more sessions)   Treatment/Interventions Compensatory strategies;Cueing hierarchy;Functional tasks;Patient/family education;Cognitive reorganization;Multimodal communcation approach;Language  facilitation;Compensatory techniques;Internal/external aids;SLP instruction and feedback    Potential to Achieve Goals Fair    Potential Considerations Ability to learn/carryover information;Previous level of function;Co-morbidities;Severity of impairments;Medical prognosis    SLP Home Exercise Plan provided    Consulted and Agree with Plan of Care Patient;Family member/caregiver    Family Member Consulted wife, [name]             Patient will benefit from skilled therapeutic intervention in order to improve the following deficits and impairments:   Aphasia  Cognitive communication deficit    Problem List Patient Active Problem List   Diagnosis Date Noted   Obstructive sleep apnea treated with continuous positive airway pressure (CPAP) 01/08/2017   Medication management 07/30/2016   Slow urinary stream 03/21/2016   Hyperlipidemia 06/22/2014   Mild HTN 01/16/2013   HYPERLIPIDEMIA 02/17/2008   HEMORRHOIDS-INTERNAL 02/17/2008   GERD 02/12/2008    Alinda Deem, MA CCC-SLP 02/03/2021, 10:41 AM  [facility] [address] [place], [place], [address] Phone: [phone]   Fax:  [phone]   Name: Carlos Pierce MRN: [id number] Date of Birth: [birth year]

## 2021-02-20 ENCOUNTER — Other Ambulatory Visit: Payer: Self-pay

## 2021-02-20 ENCOUNTER — Ambulatory Visit: Payer: Medicare HMO | Attending: Family Medicine

## 2021-02-20 DIAGNOSIS — R4701 Aphasia: Secondary | ICD-10-CM | POA: Insufficient documentation

## 2021-02-20 DIAGNOSIS — R41841 Cognitive communication deficit: Secondary | ICD-10-CM | POA: Insufficient documentation

## 2021-02-21 NOTE — Therapy (Signed)
[facility] [address] [place], [place], [address] Phone: [phone]   Fax:  [phone]  Speech Language Pathology Treatment/Progress Note  Patient Details  Name: Carlos Pierce MRN: [id number] Date of Birth: 12-10-1940 Referring Provider (SLP): Nickola Major, MD   Encounter Date: 02/20/2021   End of Session - 02/21/21 0821     Visit Number 20    Number of Visits 25    Date for SLP Re-Evaluation 03/10/21    Authorization Type [employer]    SLP Start Time 1700    SLP Stop Time  2878    SLP Time Calculation (min) 45 min    Activity Tolerance Patient tolerated treatment well             Past Medical History:  Diagnosis Date   Memory loss     Past Surgical History:  Procedure Laterality Date   CATARACT EXTRACTION Bilateral    HEMORRHOID BANDING  2016    There were no vitals filed for this visit.   Subjective Assessment - 02/21/21 0814     Subjective "I finished it in the first week" re: HEP packet    Patient is accompained by: Family member   wife, Carlos Pierce   Currently in Pain? No/denies             Speech Therapy Progress Note  Dates of Reporting Period: 12-23-20 (last progress note) to current  Objective Reports of Subjective Statement: Pt and wife report improvements in verbal expression since initiation of ST services  Objective Measurements: Pt requires less frequent cues to compensate for anomia in conversation of personally relevant topics. Wife is now providing appropriate cues to aid naming and recall. Ongoing education and training of functional opportunities for maintenance of current cognitive communication skills, with expected progression of dementia reiterated.   Goal Update: see goals below  Plan: continue per POC  Reason Skilled Services are Required: pt has not yet met rehab potential so skilled ST services still warranted to maximize current cognitive  communication skills       ADULT SLP TREATMENT - 02/21/21 0814       General Information   Behavior/Cognition Alert;Pleasant mood;Cooperative      Treatment Provided   Treatment provided Cognitive-Linquistic      Cognitive-Linquistic Treatment   Treatment focused on Cognition;Aphasia;Patient/family/caregiver education    Skilled Treatment Pt reportedly finished HEP packet in the first week and re-read every day the next week during two week gap from therapy. Pt discussed recent events at family [employer], with rare overt anomia noted in recollection task. SLP cued use of compensations when anomia occured, which pt able to describe item with some mod prompting. SLP educated patient and wife on various ways to engage cognitive communication skills at work and home, in which both verbalized understanding. 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 SLP targeted money sorting tasks as pt requested to handle small amount of cash at work. Usual fading to occasional cues required to ID different coin types on worksheet.      Assessment / Recommendations / Plan   Plan Continue with current plan of care      Progression Toward Goals   Progression toward goals Progressing toward goals              SLP Education - 02/21/21 0820     Education  Details cognitive communication skills/tasks to target at [facility]) Educated Patient;Spouse    Methods Explanation;Demonstration;Handout    Comprehension Verbalized understanding;Returned demonstration;Need further instruction              SLP Short Term Goals - 12/20/20 1650       SLP SHORT TERM GOAL #1   Title Pt will verbally generate 5 items in personally relevant categories with occasional min A over 2 sessions    Baseline 12-06-20    Status Partially Met      SLP SHORT  TERM GOAL #2   Title Pt able to utilize word finding compensations in 5 minute simple conversation with usual min A  over 2 sessions    Baseline 12-20-20    Status Partially Met      SLP SHORT TERM GOAL #3   Title Caregivers will appropriately cue patient when word finding occurs for 4/5 opportunities given rare min A over 2 sessions    Baseline 11-28-20, 12-06-20    Status Achieved      SLP SHORT TERM GOAL #4   Title Pt will recall and answer simple open/close-ended questions with occasional min A and 2 or less repetitions over 2 sessions    Baseline 11-28-20    Status Partially Met      SLP SHORT TERM GOAL #5   Title Family will use 2 memory compensations at work/home to aid completion of tasks with occasional min A over 2 sessions    Baseline 11-28-20    Status Partially Met              SLP Long Term Goals - 02/21/21 1245       SLP LONG TERM GOAL #1   Title 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 and occasional min A over 2 sessions    Baseline 01-11-21    Status Partially Met      SLP LONG TERM GOAL #2   Title Pt able to utilize word finding compensations in 10 minute simple conversation with occasional min A over 2 sessions    Baseline 01-11-21, 01-20-21    Status Achieved      SLP LONG TERM GOAL #3   Title Pt/caregivers will implement 3 memory compensations at work/home to aid accurate completion of tasks with rare min A over 2 sessions    Baseline 01-20-21, 01-27-21    Status Achieved      SLP LONG TERM GOAL #4   Title Pt/Caregiver will report reduced frustration and improved communication effectiveness via PROM by 2 points last ST session    Time 6    Period Weeks    Status On-going    Target Date 03/10/21      SLP LONG TERM GOAL #5   Title Pt will utilize communication supports in conversation to aid word finding and recall deficits for 4/5 opportunities given occasional mod A over 2 sessions    Time 6    Period Weeks     Status On-going    Target Date 03/10/21      SLP LONG TERM GOAL #6   Title Pt/caregiver will ID 3 communication scenarios to engage pt's naming and recall skills after ST discharge    Time 6    Period Weeks    Status On-going    Target Date 03/10/21              Plan - 02/21/21 8099  Clinical Impression Statement "Carlos Pierce" was referred for OPST intervention to address worsening aphasia and memory related to dementia. SLP targeted functional cognitive communication tasks related to job at family [employer], with rare cues required to aid naming this session. SLP reviewed recommendations with patient and wife re: how to support current cognitive communication skills at home as well as explained how given the progressive nature of dementia, communication and cognition will need further support from family in the future. Skilled ST is warranted to optimize current cognitive communciation skills for maintenance and provide caregiver education re: compensations and modifcations as dementia progresses.    Speech Therapy Frequency 2x / week    Duration --   6 more weeks due to scheduling (anticipate d/c after 8 sessions)   Treatment/Interventions Compensatory strategies;Cueing hierarchy;Functional tasks;Patient/family education;Cognitive reorganization;Multimodal communcation approach;Language facilitation;Compensatory techniques;Internal/external aids;SLP instruction and feedback    Potential to Achieve Goals Fair    Potential Considerations Ability to learn/carryover information;Previous level of function;Co-morbidities;Severity of impairments;Medical prognosis    SLP Home Exercise Plan provided    Consulted and Agree with Plan of Care Patient;Family member/caregiver    Family Member Consulted wife, Carlos Pierce             Patient will benefit from skilled therapeutic intervention in order to improve the following deficits and impairments:   Aphasia  Cognitive communication  deficit    Problem List Patient Active Problem List   Diagnosis Date Noted   Obstructive sleep apnea treated with continuous positive airway pressure (CPAP) 01/08/2017   Medication management 07/30/2016   Slow urinary stream 03/21/2016   Hyperlipidemia 06/22/2014   Mild HTN 01/16/2013   HYPERLIPIDEMIA 02/17/2008   HEMORRHOIDS-INTERNAL 02/17/2008   GERD 02/12/2008    Carlos Pierce, Carlos Pierce 02/21/2021, 8:23 AM  [facility] [address] [place], [place], [address] Phone: [phone]   Fax:  [phone]   Name: Carlos Pierce MRN: [id number] Date of Birth: 1941-06-08

## 2021-02-24 ENCOUNTER — Ambulatory Visit: Payer: Medicare HMO

## 2021-02-24 ENCOUNTER — Other Ambulatory Visit: Payer: Self-pay

## 2021-02-24 DIAGNOSIS — R41841 Cognitive communication deficit: Secondary | ICD-10-CM

## 2021-02-24 DIAGNOSIS — R4701 Aphasia: Secondary | ICD-10-CM | POA: Diagnosis not present

## 2021-02-24 NOTE — Therapy (Signed)
[facility] [address] [place], [place], [address] Phone: [phone]   Fax:  [phone]  Speech Language Pathology Treatment  Patient Details  Name: Carlos Pierce MRN: [id number] Date of Birth: June 16, 1940 Referring Provider (SLP): Nickola Major, MD   Encounter Date: 02/24/2021   End of Session - 02/24/21 1142     Visit Number 21    Number of Visits 25    Date for SLP Re-Evaluation 03/10/21    Authorization Type [employer]    SLP Start Time 0846    SLP Stop Time  0933    SLP Time Calculation (min) 47 min    Activity Tolerance Patient tolerated treatment well             Past Medical History:  Diagnosis Date   Memory loss     Past Surgical History:  Procedure Laterality Date   CATARACT EXTRACTION Bilateral    HEMORRHOID BANDING  2016    There were no vitals filed for this visit.   Subjective Assessment - 02/24/21 0852     Subjective "I'm awake"    Patient is accompained by: Family member   wife   Currently in Pain? 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 SLP cued use of description strategy, which was effective for 3/4 opportunitites. Rare dysnomia x2 noted in conversation ("safe guard" for security guard), which pt's wife corrected 1/2 dysnomias. Rare repetition required in conversation to aid comprehension and recall. Good topic maintenance noted. Pt's wife modified coin task to effecitvely aid pt's comprehension and performance on task.      Assessment / Recommendations  / Plan   Plan Continue with current plan of care      Progression Toward Goals   Progression toward goals Progressing toward goals              SLP Education - 02/24/21 1142     Education Details HEP    Person(s) Educated Patient;Spouse    Methods Explanation;Demonstration;Handout    Comprehension Verbalized understanding;Returned demonstration;Need further instruction              SLP Short Term Goals - 12/20/20 1650       SLP SHORT TERM GOAL #1   Title Pt will verbally generate 5 items in personally relevant categories with occasional min A over 2 sessions    Baseline 12-06-20    Status Partially Met      SLP SHORT TERM GOAL #2   Title Pt able to utilize word finding compensations in 5 minute simple conversation with usual min A  over 2 sessions    Baseline 12-20-20    Status Partially Met      SLP SHORT TERM GOAL #3   Title Caregivers will appropriately cue patient when word finding occurs for 4/5 opportunities given rare min A over 2 sessions    Baseline 11-28-20, 12-06-20    Status Achieved      SLP SHORT TERM GOAL #4   Title Pt will recall and answer simple open/close-ended questions  with occasional min A and 2 or less repetitions over 2 sessions    Baseline 11-28-20    Status Partially Met      SLP SHORT TERM GOAL #5   Title Family will use 2 memory compensations at work/home to aid completion of tasks with occasional min A over 2 sessions    Baseline 11-28-20    Status Partially Met              SLP Long Term Goals - 02/24/21 1143       SLP LONG TERM GOAL #1   Title 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 and occasional min A over 2 sessions    Baseline 01-11-21    Status Partially Met      SLP LONG TERM GOAL #2   Title Pt able to utilize word finding compensations in 10 minute simple conversation with occasional min A over 2 sessions    Baseline 01-11-21, 01-20-21    Status Achieved      SLP LONG TERM GOAL #3    Title Pt/caregivers will implement 3 memory compensations at work/home to aid accurate completion of tasks with rare min A over 2 sessions    Baseline 01-20-21, 01-27-21    Status Achieved      SLP LONG TERM GOAL #4   Title Pt/Caregiver will report reduced frustration and improved communication effectiveness via PROM by 2 points last ST session    Time 6    Period Weeks    Status On-going    Target Date 03/10/21      SLP LONG TERM GOAL #5   Title Pt will utilize communication supports in conversation to aid word finding and recall deficits for 4/5 opportunities given occasional mod A over 2 sessions    Time 6    Period Weeks    Status On-going    Target Date 03/10/21      SLP LONG TERM GOAL #6   Title Pt/caregiver will ID 3 communication scenarios to engage pt's naming and recall skills after ST discharge    Time 6    Period Weeks    Status On-going    Target Date 03/10/21              Plan - 02/24/21 1142     Clinical Impression Statement "Carlos Pierce" was referred for OPST intervention to address worsening aphasia and memory related to dementia. SLP targeted functional cognitive communication tasks related to job at family [employer], with occasional cues required to use compensations for anomia in conversation. Skilled ST is warranted to optimize current cognitive communciation skills for maintenance and provide caregiver education re: compensations and modifcations as dementia progresses.    Speech Therapy Frequency 2x / week    Duration Other (comment)   6 more weeks due to scheduling (anticipate d/c after 8 sessions)   Treatment/Interventions Compensatory strategies;Cueing hierarchy;Functional tasks;Patient/family education;Cognitive reorganization;Multimodal communcation approach;Language facilitation;Compensatory techniques;Internal/external aids;SLP instruction and feedback    Potential to Achieve Goals Fair    Potential Considerations Ability to learn/carryover  information;Previous level of function;Severity of impairments;Medical prognosis    SLP Home Exercise Plan provided    Consulted and Agree with Plan of Care Patient;Family member/caregiver    Family Member Consulted wife, Carlos Pierce             Patient will benefit from skilled therapeutic intervention in order to improve the following deficits and impairments:   Aphasia  Cognitive communication deficit  Problem List Patient Active Problem List   Diagnosis Date Noted   Obstructive sleep apnea treated with continuous positive airway pressure (CPAP) 01/08/2017   Medication management 07/30/2016   Slow urinary stream 03/21/2016   Hyperlipidemia 06/22/2014   Mild HTN 01/16/2013   HYPERLIPIDEMIA 02/17/2008   HEMORRHOIDS-INTERNAL 02/17/2008   GERD 02/12/2008    Alinda Deem, MA CCC-SLP 02/24/2021, 11:45 AM  [facility] [address] [place], [place], [address] Phone: [phone]   Fax:  [phone]   Name: Carlos Pierce MRN: [id number] Date of Birth: 1941/05/25

## 2021-02-28 ENCOUNTER — Ambulatory Visit: Payer: Medicare HMO

## 2021-02-28 ENCOUNTER — Other Ambulatory Visit: Payer: Self-pay

## 2021-02-28 DIAGNOSIS — R4701 Aphasia: Secondary | ICD-10-CM | POA: Diagnosis not present

## 2021-02-28 DIAGNOSIS — R41841 Cognitive communication deficit: Secondary | ICD-10-CM

## 2021-02-28 NOTE — Patient Instructions (Addendum)
[place]: [place] [employer]   [place] Asian: [place] Chicken with brown rice   Carlos Pierce

## 2021-02-28 NOTE — Therapy (Signed)
[facility] [address] [place], [place], [address] Phone: [phone]   Fax:  [phone]  Speech Language Pathology Treatment  Patient Details  Name: Carlos Pierce MRN: [id number] Date of Birth: [birth year] Referring Provider (SLP): Nickola Major, MD   Encounter Date: 02/28/2021   End of Session - 02/28/21 1659     Visit Number 22    Number of Visits 25    Date for SLP Re-Evaluation 03/10/21    Authorization Type [employer]    SLP Start Time 1700    SLP Stop Time  4496    SLP Time Calculation (min) 45 min    Activity Tolerance Patient tolerated treatment well             Past Medical History:  Diagnosis Date   Memory loss     Past Surgical History:  Procedure Laterality Date   CATARACT EXTRACTION Bilateral    HEMORRHOID BANDING  2016    There were no vitals filed for this visit.   Subjective Assessment - 02/28/21 1749     Subjective "I'm awake"    Patient is accompained by: Family member   wife   Currently in Pain? No/denies                   ADULT SLP TREATMENT - 02/28/21 1659       General Information   Behavior/Cognition Alert;Pleasant mood;Cooperative;Confused;Requires cueing      Treatment Provided   Treatment provided Cognitive-Linquistic      Cognitive-Linquistic Treatment   Treatment focused on Cognition;Aphasia;Patient/family/caregiver education    Skilled Treatment Increased anomia and recall deficits exhibited in conversation this session. Cued use of description strategy was rarely effective. First letter and mod semantic cues were also largely ineffective this session. Pt aware of improved cognitive linguistic functioning in morning versus evening. SLP recommended using external aids to support communication of favorite ordered food items.      Assessment / Recommendations / Plan   Plan Continue with current plan of care;MBS      Progression Toward  Goals   Progression toward goals Progressing toward goals              SLP Education - 02/28/21 1934     Education Details cued use of compensations, functional communication re: favorite foods    Person(s) Educated Patient;Spouse    Methods Explanation;Demonstration;Handout;Verbal cues    Comprehension Verbalized understanding;Returned demonstration;Need further instruction;Verbal cues required              SLP Short Term Goals - 12/20/20 1650       SLP SHORT TERM GOAL #1   Title Pt will verbally generate 5 items in personally relevant categories with occasional min A over 2 sessions    Baseline 12-06-20    Status Partially Met      SLP SHORT TERM GOAL #2   Title Pt able to utilize word finding compensations in 5 minute simple conversation with usual min A  over 2 sessions    Baseline 12-20-20    Status Partially Met      SLP SHORT TERM GOAL #3   Title Caregivers will appropriately cue patient when word finding occurs for 4/5 opportunities given rare min A over 2 sessions    Baseline 11-28-20, 12-06-20    Status Achieved      SLP SHORT TERM GOAL #4   Title Pt will recall and answer simple open/close-ended questions with occasional min A and  2 or less repetitions over 2 sessions    Baseline 11-28-20    Status Partially Met      SLP SHORT TERM GOAL #5   Title Family will use 2 memory compensations at work/home to aid completion of tasks with occasional min A over 2 sessions    Baseline 11-28-20    Status Partially Met              SLP Long Term Goals - 02/28/21 1700       SLP LONG TERM GOAL #1   Title 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 and occasional min A over 2 sessions    Baseline 01-11-21    Status Partially Met      SLP LONG TERM GOAL #2   Title Pt able to utilize word finding compensations in 10 minute simple conversation with occasional min A over 2 sessions    Baseline 01-11-21, 01-20-21    Status Achieved       SLP LONG TERM GOAL #3   Title Pt/caregivers will implement 3 memory compensations at work/home to aid accurate completion of tasks with rare min A over 2 sessions    Baseline 01-20-21, 01-27-21    Status Achieved      SLP LONG TERM GOAL #4   Title Pt/Caregiver will report reduced frustration and improved communication effectiveness via PROM by 2 points last ST session    Time 6    Period Weeks    Status On-going    Target Date 03/10/21      SLP LONG TERM GOAL #5   Title Pt will utilize communication supports in conversation to aid word finding and recall deficits for 4/5 opportunities given occasional mod A over 2 sessions    Time 6    Period Weeks    Status On-going    Target Date 03/10/21      SLP LONG TERM GOAL #6   Title Pt/caregiver will ID 3 communication scenarios to engage pt's naming and recall skills after ST discharge    Time 6    Period Weeks    Status On-going    Target Date 03/10/21              Plan - 02/28/21 1659     Clinical Impression Statement "Carlos Pierce" was referred for OPST intervention to address worsening aphasia and memory related to dementia. 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 SLP emphasized using external aids to aid discussion of favorite things. Skilled ST is warranted to optimize current cognitive communciation skills for maintenance and provide caregiver education re: compensations and modifcations as dementia progresses.    Speech Therapy Frequency 2x / week    Duration Other (comment)   6 weeks   Treatment/Interventions Compensatory strategies;Cueing hierarchy;Functional tasks;Patient/family education;Cognitive reorganization;Multimodal communcation approach;Language facilitation;Compensatory techniques;Internal/external aids;SLP instruction and feedback    Potential to Achieve Goals Fair     Potential Considerations Ability to learn/carryover information;Previous level of function;Severity of impairments;Medical prognosis    SLP Home Exercise Plan provided    Consulted and Agree with Plan of Care Patient;Family member/caregiver    Family Member Consulted wife, Carlos Pierce             Patient will benefit from skilled therapeutic intervention in order to improve the following deficits and impairments:   Aphasia  Cognitive communication deficit  Problem List Patient Active Problem List   Diagnosis Date Noted   Obstructive sleep apnea treated with continuous positive airway pressure (CPAP) 01/08/2017   Medication management 07/30/2016   Slow urinary stream 03/21/2016   Hyperlipidemia 06/22/2014   Mild HTN 01/16/2013   HYPERLIPIDEMIA 02/17/2008   HEMORRHOIDS-INTERNAL 02/17/2008   GERD 02/12/2008    Alinda Deem, MA CCC-SLP 02/28/2021, 7:38 PM  [facility] [address] [place], [place], [address] Phone: [phone]   Fax:  [phone]   Name: HULON FERRON MRN: [id number] Date of Birth: 21-Jun-1940

## 2021-03-03 ENCOUNTER — Other Ambulatory Visit: Payer: Self-pay

## 2021-03-03 ENCOUNTER — Ambulatory Visit: Payer: Medicare HMO

## 2021-03-03 DIAGNOSIS — R4701 Aphasia: Secondary | ICD-10-CM

## 2021-03-03 DIAGNOSIS — R41841 Cognitive communication deficit: Secondary | ICD-10-CM

## 2021-03-03 NOTE — Therapy (Signed)
[facility] [address] [place], [place], [address] Phone: [phone]   Fax:  [phone]  Speech Language Pathology Treatment  Patient Details  Name: Carlos Pierce MRN: [id number] Date of Birth: [birth year] Referring Provider (SLP): Nickola Major, MD   Encounter Date: 03/03/2021   End of Session - 03/03/21 0843     Visit Number 23    Number of Visits 25    Date for SLP Re-Evaluation 03/10/21    Authorization Type [employer]    SLP Start Time 0845    SLP Stop Time  0931    SLP Time Calculation (min) 46 min    Activity Tolerance Patient tolerated treatment well             Past Medical History:  Diagnosis Date   Memory loss     Past Surgical History:  Procedure Laterality Date   CATARACT EXTRACTION Bilateral    HEMORRHOID BANDING  2016    There were no vitals filed for this visit.   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 No/denies                   ADULT SLP TREATMENT - 03/03/21 0842       General Information   Behavior/Cognition Alert;Pleasant mood;Cooperative;Confused;Requires cueing      Treatment Provided   Treatment provided Cognitive-Linquistic      Cognitive-Linquistic Treatment   Treatment focused on Cognition;Aphasia;Patient/family/caregiver education    Skilled Treatment Pt completed HEP with good accuracy. SLP targeted reading aloud paragraphs with fill in blanks completed. Occasional fading to rare omissions and paraphasias exhibited, as pt benefited from visual trackers and cued slow rate. SLP provided intermittent min to mod A to correct phonemic errors. SLP recommended additional reading at home.      Assessment / Recommendations / Plan   Plan Continue with current plan of care;MBS      Progression Toward Goals   Progression toward goals Progressing toward goals               SLP Education - 03/03/21 0943     Education Details HEP, reading aloud    Person(s) Educated Patient;Spouse    Methods Explanation;Demonstration;Handout;Verbal cues    Comprehension Verbalized understanding;Returned demonstration;Verbal cues required;Need further instruction              SLP Short Term Goals - 12/20/20 [place] #1   Title Pt will verbally generate 5 items in personally relevant categories with occasional min A over 2 sessions    Baseline 12-06-20    Status Partially Met      SLP SHORT TERM GOAL #2   Title Pt able to utilize word finding compensations in 5 minute simple conversation with usual min A  over 2 sessions    Baseline 12-20-20    Status Partially Met      SLP SHORT TERM GOAL #3   Title Caregivers will appropriately cue patient when word finding occurs for 4/5 opportunities given rare min A over 2 sessions    Baseline 11-28-20, 12-06-20    Status Achieved      SLP SHORT TERM GOAL #4   Title Pt will recall and answer simple open/close-ended questions with occasional min A and 2 or less repetitions over 2 sessions    Baseline 11-28-20    Status Partially  Met      SLP SHORT TERM GOAL #5   Title Family will use 2 memory compensations at work/home to aid completion of tasks with occasional min A over 2 sessions    Baseline 11-28-20    Status Partially Met              SLP Long Term Goals - 03/03/21 0844       SLP LONG TERM GOAL #1   Title 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 and occasional min A over 2 sessions    Baseline 01-11-21    Status Partially Met      SLP LONG TERM GOAL #2   Title Pt able to utilize word finding compensations in 10 minute simple conversation with occasional min A over 2 sessions    Baseline 01-11-21, 01-20-21    Status Achieved      SLP LONG TERM GOAL #3   Title Pt/caregivers will implement 3 memory compensations at work/home to aid accurate completion of  tasks with rare min A over 2 sessions    Baseline 01-20-21, 01-27-21    Status Achieved      SLP LONG TERM GOAL #4   Title Pt/Caregiver will report reduced frustration and improved communication effectiveness via PROM by 2 points last ST session    Time 6    Period Weeks    Status On-going    Target Date 03/10/21      SLP LONG TERM GOAL #5   Title Pt will utilize communication supports in conversation to aid word finding and recall deficits for 4/5 opportunities given occasional mod A over 2 sessions    Time 6    Period Weeks    Status On-going    Target Date 03/10/21      SLP LONG TERM GOAL #6   Title Pt/caregiver will ID 3 communication scenarios to engage pt's naming and recall skills after ST discharge    Time 6    Period Weeks    Status On-going    Target Date 03/10/21              Plan - 03/03/21 0844     Clinical Impression Statement "Carlos Pierce" was referred for OPST intervention to address worsening aphasia and memory related to dementia. SLP targeted functional cognitive communication tasks and reading aloud to aid fluency and word finding, with occasional fading to rare cues required to slow rate, correct paraphasias, and not omit words. SLP emphasized using external aids to aid discussion of favorite things. Skilled ST is warranted to optimize current cognitive communciation skills for maintenance and provide caregiver education re: compensations and modifcations as dementia progresses.    Speech Therapy Frequency 2x / week    Duration Other (comment)   6 weeks   Treatment/Interventions Compensatory strategies;Cueing hierarchy;Functional tasks;Patient/family education;Cognitive reorganization;Multimodal communcation approach;Language facilitation;Compensatory techniques;Internal/external aids;SLP instruction and feedback    Potential to Achieve Goals Fair    Potential Considerations Ability to learn/carryover information;Previous level of function;Severity of  impairments;Medical prognosis    SLP Home Exercise Plan provided    Consulted and Agree with Plan of Care Patient;Family member/caregiver             Patient will benefit from skilled therapeutic intervention in order to improve the following deficits and impairments:   Aphasia  Cognitive communication deficit    Problem List Patient Active Problem List   Diagnosis Date Noted   Obstructive sleep apnea treated with continuous positive  airway pressure (CPAP) 01/08/2017   Medication management 07/30/2016   Slow urinary stream 03/21/2016   Hyperlipidemia 06/22/2014   Mild HTN 01/16/2013   HYPERLIPIDEMIA 02/17/2008   HEMORRHOIDS-INTERNAL 02/17/2008   GERD 02/12/2008    Alinda Deem, MA CCC-SLP 03/03/2021, 9:44 AM  [facility] [address] [place], [place], [address] Phone: [phone]   Fax:  [phone]   Name: Carlos Pierce MRN: [id number] Date of Birth: 1941-01-14

## 2021-03-07 ENCOUNTER — Ambulatory Visit: Payer: Medicare HMO

## 2021-03-07 ENCOUNTER — Other Ambulatory Visit: Payer: Self-pay

## 2021-03-07 DIAGNOSIS — R41841 Cognitive communication deficit: Secondary | ICD-10-CM

## 2021-03-07 DIAGNOSIS — R4701 Aphasia: Secondary | ICD-10-CM | POA: Diagnosis not present

## 2021-03-07 NOTE — Therapy (Signed)
[facility] [address] [place], [place], [address] Phone: [phone]   Fax:  [phone]  Speech Language Pathology Treatment  Patient Details  Name: Carlos Pierce MRN: [id number] Date of Birth: 09-25-1940 Referring Provider (SLP): Carlos Major, MD   Encounter Date: 03/07/2021   End of Session - 03/07/21 1658     Visit Number 24    Number of Visits 25    Date for SLP Re-Evaluation 03/10/21    Authorization Type [employer]    SLP Start Time 1700    SLP Stop Time  1324    SLP Time Calculation (min) 45 min    Activity Tolerance Patient tolerated treatment well             Past Medical History:  Diagnosis Date   Memory loss     Past Surgical History:  Procedure Laterality Date   CATARACT EXTRACTION Bilateral    HEMORRHOID BANDING  2016    There were no vitals filed for this visit.   Subjective Assessment - 03/07/21 1701     Subjective "we went to... to the place you bid on stuff"    Currently in Pain? No/denies                   ADULT SLP TREATMENT - 03/07/21 1658       General Information   Behavior/Cognition Alert;Pleasant mood;Cooperative;Confused;Requires cueing      Treatment Provided   Treatment provided Cognitive-Linquistic      Cognitive-Linquistic Treatment   Treatment focused on Cognition;Aphasia;Patient/family/caregiver education    Skilled Treatment Pt verbally summarized recent events, including attending an auction, family events, and birthday dinner. Occasional prompting from wife required to recall and verbalize specific information. Pt exhibited intermittent anomia, in which pt occasionally used descriptions and synonms when word finding occured. Occasional abandonment of targeted words noted, in which SLP cued patient to repeat words.      Assessment / Recommendations / Plan   Plan Continue with current plan of care;MBS      Progression Toward  Goals   Progression toward goals Progressing toward goals              SLP Education - 03/07/21 1757     Education Details HEP s/p ST discharge    Person(s) Educated Patient;Spouse    Methods Explanation;Demonstration;Handout;Verbal cues    Comprehension Verbalized understanding;Returned demonstration;Verbal cues required;Need further instruction              SLP Short Term Goals - 12/20/20 1650       SLP SHORT TERM GOAL #1   Title Pt will verbally generate 5 items in personally relevant categories with occasional min A over 2 sessions    Baseline 12-06-20    Status Partially Met      SLP SHORT TERM GOAL #2   Title Pt able to utilize word finding compensations in 5 minute simple conversation with usual min A  over 2 sessions    Baseline 12-20-20    Status Partially Met      SLP SHORT TERM GOAL #3   Title Caregivers will appropriately cue patient when word finding occurs for 4/5 opportunities given rare min A over 2 sessions    Baseline 11-28-20, 12-06-20    Status Achieved      SLP SHORT TERM GOAL #4   Title Pt will recall and answer simple open/close-ended questions with occasional min A and 2 or less repetitions over 2 sessions  Baseline 11-28-20    Status Partially Met      SLP SHORT TERM GOAL #5   Title Family will use 2 memory compensations at work/home to aid completion of tasks with occasional min A over 2 sessions    Baseline 11-28-20    Status Partially Met              SLP Long Term Goals - 03/07/21 1659       SLP LONG TERM GOAL #1   Title 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 and occasional min A over 2 sessions    Baseline 01-11-21    Status Partially Met      SLP LONG TERM GOAL #2   Title Pt able to utilize word finding compensations in 10 minute simple conversation with occasional min A over 2 sessions    Baseline 01-11-21, 01-20-21    Status Achieved      SLP LONG TERM GOAL #3   Title Pt/caregivers  will implement 3 memory compensations at work/home to aid accurate completion of tasks with rare min A over 2 sessions    Baseline 01-20-21, 01-27-21    Status Achieved      SLP LONG TERM GOAL #4   Title Pt/Caregiver will report reduced frustration and improved communication effectiveness via PROM by 2 points last ST session    Time 6    Period Weeks    Status On-going    Target Date 03/10/21      SLP LONG TERM GOAL #5   Title Pt will utilize communication supports in conversation to aid word finding and recall deficits for 4/5 opportunities given occasional mod A over 2 sessions    Time 6    Period Weeks    Status On-going    Target Date 03/10/21      SLP LONG TERM GOAL #6   Title Pt/caregiver will ID 3 communication scenarios to engage pt's naming and recall skills after ST discharge    Time 6    Period Weeks    Status On-going    Target Date 03/10/21              Plan - 03/07/21 1659     Clinical Impression Statement "Carlos Pierce" was referred for OPST intervention to address worsening aphasia and memory related to dementia. SLP targeted functional cognitive communication tasks and word finding in personally relevant conversations, with usual fading to occasional cues required to slow rate and aid anomia. SLP emphasized using external aids to aid discussion of favorite things. Skilled ST is warranted to optimize current cognitive communciation skills for maintenance and provide caregiver education re: compensations and modifcations as dementia progresses.    Speech Therapy Frequency 2x / week    Duration Other (comment)   6 weeks   Treatment/Interventions Compensatory strategies;Cueing hierarchy;Functional tasks;Patient/family education;Cognitive reorganization;Multimodal communcation approach;Language facilitation;Compensatory techniques;Internal/external aids;SLP instruction and feedback    Potential to Achieve Goals Fair    Potential Considerations Ability to learn/carryover  information;Previous level of function;Severity of impairments;Medical prognosis    SLP Home Exercise Plan provided    Consulted and Agree with Plan of Care Patient;Family member/caregiver    Family Member Consulted wife, Carlos Pierce             Patient will benefit from skilled therapeutic intervention in order to improve the following deficits and impairments:   Aphasia  Cognitive communication deficit    Problem List Patient Active Problem List   Diagnosis Date  Noted   Obstructive sleep apnea treated with continuous positive airway pressure (CPAP) 01/08/2017   Medication management 07/30/2016   Slow urinary stream 03/21/2016   Hyperlipidemia 06/22/2014   Mild HTN 01/16/2013   HYPERLIPIDEMIA 02/17/2008   HEMORRHOIDS-INTERNAL 02/17/2008   GERD 02/12/2008    Alinda Deem, MA CCC-SLP 03/07/2021, 5:58 PM  [facility] [address] [place], [place], [address] Phone: [phone]   Fax:  [phone]   Name: Carlos Pierce MRN: [id number] Date of Birth: June 13, 1940

## 2021-03-10 ENCOUNTER — Ambulatory Visit: Payer: Medicare HMO

## 2021-03-14 ENCOUNTER — Other Ambulatory Visit: Payer: Self-pay

## 2021-03-14 ENCOUNTER — Ambulatory Visit: Payer: Medicare HMO | Attending: Family Medicine

## 2021-03-14 DIAGNOSIS — R41841 Cognitive communication deficit: Secondary | ICD-10-CM | POA: Diagnosis present

## 2021-03-14 DIAGNOSIS — R4701 Aphasia: Secondary | ICD-10-CM | POA: Insufficient documentation

## 2021-03-14 NOTE — Patient Instructions (Signed)
Possible workbooks: Speech Therapy Aphasia Rehabilitation Workbook by Josephina Shih Stroke Recovery Activity Book by Starleen Blue Stroke Recovery Activity Book by [employer]  Stroke Recovery Activity Book: Aphasia Rehabilitation by [employer]

## 2021-03-15 NOTE — Therapy (Signed)
[facility] [address] [place], [place], [address] Phone: [phone]   Fax:  [phone]  Speech Language Pathology Treatment/Discharge Summary  Patient Details  Name: Carlos Pierce MRN: [id number] Date of Birth: Aug 04, 1940 Referring Provider (SLP): Nickola Major, MD   Encounter Date: 03/14/2021   End of Session - 03/14/21 1801     Visit Number 25    Number of Visits 25    Date for SLP Re-Evaluation 03/14/21   extended for last ST session   Authorization Type [employer]    SLP Start Time 1748    SLP Stop Time  1830    SLP Time Calculation (min) 42 min    Activity Tolerance Patient tolerated treatment well             Past Medical History:  Diagnosis Date   Memory loss     Past Surgical History:  Procedure Laterality Date   CATARACT EXTRACTION Bilateral    HEMORRHOID BANDING  2016    There were no vitals filed for this visit.   Subjective Assessment - 03/14/21 1751     Subjective "we lost power"    Patient is accompained by: Family member    Currently in Pain? No/denies             SPEECH THERAPY DISCHARGE SUMMARY  Visits from Start of Care: 25  Current functional level related to goals / functional outcomes: Carlos Pierce is discharging from skilled ST intervention targeting aphasia and cognition. Pt has exhibited some progress using anomia compensations in conversation to optimize verbal expression and listener comprehension. Pt's wife provides appropriate cues to aid word finding and recall as needed. SLP recommended functional communication supports and use of external aids at work and home to assist patient in daily tasks. Pt and family may benefit from additional ST intervention in the future to aid progressive decline in cognitive communication.    Remaining deficits: Dementia, aphasia    Education / Equipment: Anomia strategies, HEP, caregiver education, functional  communication supports and aids for work and home   Patient agrees to discharge. Patient goals were partially met. Patient is being discharged due to maximized rehab potential. .         ADULT SLP TREATMENT - 03/14/21 1751       General Information   Behavior/Cognition Alert;Pleasant mood;Cooperative;Requires cueing      Treatment Provided   Treatment provided Cognitive-Linquistic      Cognitive-Linquistic Treatment   Treatment focused on Cognition;Aphasia;Patient/family/caregiver education    Skilled Treatment Pt participated in open ended discussion with occasional anomic episodes. Pt benefited from additional time and semantic cues to aid naming. SLP reviewed recommendations to engage patient's memory and language s/p ST discharge. Pt and wife verbalized understanding.      Assessment / Recommendations / Plan   Plan Discharge SLP treatment due to (comment)   POC complete     Progression Toward Goals   Progression toward goals Goals partially met, education completed, patient discharged from SLP              SLP Education - 03/14/21 1806     Education Details discharge summary, HEP s/p discharge    Person(s) Educated Patient;Spouse    Methods Explanation;Demonstration;Handout    Comprehension Verbalized understanding;Returned demonstration;Need further instruction              SLP Short Term Goals - 12/20/20 1650       SLP SHORT TERM GOAL #1  Title Pt will verbally generate 5 items in personally relevant categories with occasional min A over 2 sessions    Baseline 12-06-20    Status Partially Met      SLP SHORT TERM GOAL #2   Title Pt able to utilize word finding compensations in 5 minute simple conversation with usual min A  over 2 sessions    Baseline 12-20-20    Status Partially Met      SLP SHORT TERM GOAL #3   Title Caregivers will appropriately cue patient when word finding occurs for 4/5 opportunities given rare min A over 2 sessions    Baseline  11-28-20, 12-06-20    Status Achieved      SLP SHORT TERM GOAL #4   Title Pt will recall and answer simple open/close-ended questions with occasional min A and 2 or less repetitions over 2 sessions    Baseline 11-28-20    Status Partially Met      SLP SHORT TERM GOAL #5   Title Family will use 2 memory compensations at work/home to aid completion of tasks with occasional min A over 2 sessions    Baseline 11-28-20    Status Partially Met              SLP Long Term Goals - 03/14/21 1808       SLP LONG TERM GOAL #1   Title 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 and occasional min A over 2 sessions    Baseline 01-11-21    Status Partially Met      SLP LONG TERM GOAL #2   Title Pt able to utilize word finding compensations in 10 minute simple conversation with occasional min A over 2 sessions    Baseline 01-11-21, 01-20-21    Status Achieved      SLP LONG TERM GOAL #3   Title Pt/caregivers will implement 3 memory compensations at work/home to aid accurate completion of tasks with rare min A over 2 sessions    Baseline 01-20-21, 01-27-21    Status Achieved      SLP LONG TERM GOAL #4   Title Pt/Caregiver will report reduced frustration and improved communication effectiveness via PROM by 2 points last ST session    Status Achieved      SLP LONG TERM GOAL #5   Title Pt will utilize communication supports in conversation to aid word finding and recall deficits for 4/5 opportunities given occasional mod A over 2 sessions    Status Partially Met      SLP LONG TERM GOAL #6   Title Pt/caregiver will ID 3 communication scenarios to engage pt's naming and recall skills after ST discharge    Status Achieved              Plan - 03/15/21 0850     Clinical Impression Statement "Carlos Pierce" was referred for OPST intervention to address worsening aphasia and memory related to dementia. SLP targeted functional cognitive communication tasks and word finding in  personally relevant conversations, with occasional cues required to slow rate and aid anomia. SLP emphasized using external aids and communication supports s/p ST discharge. Pt exhibited good progress with maximum potential exhibited at this time. Pt and wife verbalized understanding and agreement with ST discharge. Pt may benefit from additional ST services in the future to optimize cognitive communciation skills for maintenance and provide caregiver education re: compensations and modifcations as dementia progresses.    Treatment/Interventions Compensatory strategies;Cueing hierarchy;Functional  tasks;Patient/family education;Cognitive reorganization;Multimodal communcation approach;Language facilitation;Compensatory techniques;Internal/external aids;SLP instruction and feedback    Potential to Achieve Goals Fair    Potential Considerations Ability to learn/carryover information;Previous level of function;Severity of impairments;Medical prognosis    SLP Home Exercise Plan provided    Consulted and Agree with Plan of Care Patient;Family member/caregiver             Patient will benefit from skilled therapeutic intervention in order to improve the following deficits and impairments:   Aphasia  Cognitive communication deficit    Problem List Patient Active Problem List   Diagnosis Date Noted   Obstructive sleep apnea treated with continuous positive airway pressure (CPAP) 01/08/2017   Medication management 07/30/2016   Slow urinary stream 03/21/2016   Hyperlipidemia 06/22/2014   Mild HTN 01/16/2013   HYPERLIPIDEMIA 02/17/2008   HEMORRHOIDS-INTERNAL 02/17/2008   GERD 02/12/2008    Alinda Deem, MA CCC-SLP 03/15/2021, 8:52 AM  [facility] [address] [place], [place], [address] Phone: [phone]   Fax:  [phone]   Name: Carlos Pierce MRN: [id number] Date of Birth: 1940-08-23

## 2021-03-30 NOTE — Progress Notes (Signed)
Chief Complaint  Patient presents with   Obstructive Sleep Apnea    Rm 1, w wife. Here for yearly CPAP f/u. Pt reports doing well. No issues or concerns. Pt reports going to speech therapy, due to having aphasia. Pt did 16 weeks. Pt reports improvements in forming sentences.     HISTORY OF PRESENT ILLNESS: 04/03/21 ALL: Jovaughn returns for follow up for OSA on ASV and memory loss. Memory is stable on Aricept and Namenda. Memory loss progressing. Some days are better than other. PCP sent him for ST. He completed ST with neuro rehab for aphasia 03/14/21. He plans to check back in in January. He does feel that therapy has helped. He is better able to form sentences. Mood is good. No behavioral concerns. He sleeps about 6-9 hours. He recently got a new bed and feels he is sleeping better. He is using ASV nightly. He uses 2L O2 at night. He is using FFM and is happy with the fit. 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 He is still driving. Only a couple of mile to and from work. Wife has not noted any significant difficulty. She feels that he drives safely.   Set up date 11/13/2016     03/31/2020 ALL:  TRUNG WENZL is a 80 y.o. male here today for follow up for OSA on ASV and memory loss. He continues Aricept and Namenda.   Compliance report dated 02/29/2020 through 03/29/2020 reveals that he used ASV 30 of the past 30 days for compliance of 100%.  He used ASV greater than 4 hours 27 of the past 30 days for compliance of 90%.  Average usage was 7 hours and 16 minutes residual AHI was 2.3 with a EPAP of 15 cm of water, minimum pressure support 3 cm of water and maximum pressure support of 10 cm of water.  There was a leak noted in the 95th percentile at 33 L/min.  HISTORY (copied from my note on 08/27/2019)  BRANSON KRANZ is a 80 y.o. male here today for follow up for dementia and OSA recently started on ASV. AHI remained elevated on CPAP and titration study  showed resolution with ASV with O2 at 1L. He returns today for compliance review.  He reports that he is doing fairly well with therapy.  He does mention concerns of not having a chinstrap.  When fitted in the office during his titration study, he did well with a small Quatro full facemask and chinstrap.  He is uncertain of what mask he is using now.  He has noted an increased leak when he sleeps on his side.  He had a.  Of time where he fell and was unable to use his machine for about a week.  He reports slipping after stepping down from the fireplace.  He denies any difficulty with recurrent falls or changes in gait. He continues Namenda 10mg  BID and Aricept 10mg  QHS. He feels that memory is fairly stable. He has good and bad days.  He is able to perform all ADLs independently.  He is driving without difficulty.  He is able to manage finances and take medications independently.   Compliance report dated 06/27/2019 through 08/25/2019 reveals that he has used ASV therapy 44 of the past 60 days for compliance of 73%.  He has used ASV therapy greater than 4 hours 40 of the past 60 days for compliance of 67%.  Average usage on days used was 6  hours and 11 minutes.  Residual AHI was 4.0 with a EPAP of 15 cm of water, minimum pressure support of 3 cm of water and maximum pressure support of 10 cm of water.  There was a significant leak noted in the 95th percentile of 77.4 L/min.    REVIEW OF SYSTEMS: Out of a complete 14 system review of symptoms, the patient complains only of the following symptoms, memory loss, aphasia, hearing loss and all other reviewed systems are negative.  ESS: 4  ALLERGIES: No Known Allergies   HOME MEDICATIONS: Outpatient Medications Prior to Visit  Medication Sig Dispense Refill   ACETYLCYSTEINE PO Take by mouth.     BRAN FIBER PO Take by mouth.     Coenzyme Q10 (CO Q 10 PO) Take 300 mg by mouth daily.     ezetimibe (ZETIA) 10 MG tablet Take 1 tablet (10 mg total) by mouth  daily. 90 tablet 3   famotidine (PEPCID) 20 MG tablet Take 20 mg by mouth daily.      fluticasone (FLONASE) 50 MCG/ACT nasal spray Place into the nose.     Ginkgo Biloba 40 MG TABS Take 240 mg by mouth daily.     L-Tyrosine 500 MG CAPS Take 1 capsule by mouth daily.     levothyroxine (SYNTHROID) 25 MCG tablet Take 25 mcg by mouth daily before breakfast.     lisinopril (ZESTRIL) 5 MG tablet Take 0.5 tablets (2.5 mg total) by mouth daily. 90 tablet 0   Multiple Vitamins-Minerals (MULTIVITAMIN & MINERAL PO) Take 1 tablet by mouth daily.     omega-3 acid ethyl esters (LOVAZA) 1 g capsule Take 1 capsule (1 g total) by mouth daily. 90 capsule 3   donepezil (ARICEPT) 10 MG tablet Take 1 tablet (10 mg total) by mouth at bedtime. 90 tablet 4   memantine (NAMENDA) 10 MG tablet Take 1 tablet (10 mg total) by mouth 2 (two) times daily. 180 tablet 4   No facility-administered medications prior to visit.     PAST MEDICAL HISTORY: Past Medical History:  Diagnosis Date   Memory loss      PAST SURGICAL HISTORY: Past Surgical History:  Procedure Laterality Date   CATARACT EXTRACTION Bilateral    HEMORRHOID BANDING  2016     FAMILY HISTORY: History reviewed. No pertinent family history.   SOCIAL HISTORY: Social History   Socioeconomic History   Marital status: Married    Spouse name: [name]   Number of children: 1   Years of education: 14   Highest education level: Not on file  Occupational History    Comment: [employer]  Tobacco Use   Smoking status: Never   Smokeless tobacco: Never  Substance and Sexual Activity   Alcohol use: Yes    Alcohol/week: 2.0 standard drinks    Types: 2 Standard drinks or equivalent per week    Comment: wine a few days a week   Drug use: No   Sexual activity: Not on file  Other Topics Concern   Not on file  Social History Narrative   Drinks 1 cup of coffee a day    Social Determinants of Health   Financial Resource Strain: Not on  file  Food Insecurity: Not on file  Transportation Needs: Not on file  Physical Activity: Not on file  Stress: Not on file  Social Connections: Not on file  Intimate Partner Violence: Not on file      PHYSICAL EXAM  Vitals:   04/03/21  0859  BP: 119/71  Pulse: 60  Weight: 152 lb 8 oz (69.2 kg)  Height: 5' 5.5" (1.664 m)    Body mass index is 24.99 kg/m.   Generalized: Well developed, in no acute distress   Neurological examination  Mentation: Alert, he is not oriented to time, but is able to correctly state place and situation. Can assist with most history taking. Follows all commands, expressive aphasia, comprehension intact.   Cranial nerve II-XII: Pupils were equal round reactive to light. Extraocular movements were full, visual field were full  Motor: The motor testing reveals 5 over 5 strength of all 4 extremities. Good symmetric motor tone is noted throughout.  Gait and station: Gait is normal.     DIAGNOSTIC DATA (LABS, IMAGING, TESTING) - I reviewed patient records, labs, notes, testing and imaging myself where available.  Lab Results  Component Value Date   WBC 6.4 08/22/2020   HGB 13.6 08/22/2020   HCT 41.5 08/22/2020   MCV 90 08/22/2020   PLT 149 (L) 08/22/2020      Component Value Date/Time   NA 146 (H) 07/11/2020 0857   K 4.8 07/11/2020 0857   CL 109 (H) 07/11/2020 0857   CO2 26 07/11/2020 0857   GLUCOSE 111 (H) 07/11/2020 0857   GLUCOSE 103 (H) 06/29/2016 1021   BUN 26 07/11/2020 0857   CREATININE 1.10 07/11/2020 0857   CREATININE 1.17 06/29/2016 1021   CALCIUM 9.7 07/11/2020 0857   PROT 6.3 07/11/2020 0857   ALBUMIN 4.4 07/11/2020 0857   AST 18 07/11/2020 0857   ALT 31 07/11/2020 0857   ALKPHOS 57 07/11/2020 0857   BILITOT 0.3 07/11/2020 0857   GFRNONAA 64 07/11/2020 0857   GFRAA 74 07/11/2020 0857   Lab Results  Component Value Date   CHOL 193 08/22/2020   HDL 57 08/22/2020   LDLCALC 116 (H) 08/22/2020   TRIG 115 08/22/2020    CHOLHDL 3.4 08/22/2020   No results found for: HGBA1C Lab Results  Component Value Date   VITAMINB12 815 07/23/2016   Lab Results  Component Value Date   TSH 7.360 (H) 08/22/2020   MMSE - Mini Mental State Exam 04/03/2021 03/31/2020 08/27/2019  Orientation to time 0 2 4  Orientation to Place 1 2 3   Registration 2 3 3   Attention/ Calculation 0 4 4  Attention/Calculation-comments - did WORLD )spelled DLOW -  Recall 0 2 1  Language- name 2 objects 2 2 2   Language- repeat 0 0 0  Language- follow 3 step command 3 3 3   Language- read & follow direction 1 1 1   Write a sentence 1 1 1   Copy design 0 0 1  Total score 10 20 23      ASSESSMENT AND PLAN  81 y.o. year old male  has a past medical history of Memory loss. here with   Memory loss - Plan: donepezil (ARICEPT) 10 MG tablet, memantine (NAMENDA) 10 MG tablet  ASV (adaptive servo-ventilation) use counseling - Plan: For home use only DME Bipap  Mohan is doing well, today.  Compliance report reveals excellent compliance.  Leak continues. He was encouraged to continue monitoring at home. May not need chin strap with full face mask. He is sleeping much better.  Memory loss continues to progress. MMSE 10/30, however, most likely negatively impacted somewhat by expressive aphasia. He will continue Aricept and Namenda as prescribed.  He was encouraged to continue using ASV therapy nightly and for greater than 4 hours each night.  Healthy  lifestyle habits reviewed. We have had a lengthy discussion regarding driving safety. I have recommended safest option is that he not drive as MMSE does indicate progression of memory loss. He was cautioned to only drive very short, familiar, local distances. [employer] app recommended to wife. She will continue to assess safety at home. Memory compensation strategies encouraged.  He will follow-up with [place] in 6 months, sooner if needed.  He verbalizes understanding and agreement with this plan.   Debbora Presto,  MSN, FNP-C 04/03/2021, 9:58 AM  [facility] [address], [place], [place] [address] [phone]

## 2021-04-03 ENCOUNTER — Other Ambulatory Visit: Payer: Self-pay

## 2021-04-03 ENCOUNTER — Encounter: Payer: Self-pay | Admitting: Family Medicine

## 2021-04-03 ENCOUNTER — Ambulatory Visit: Payer: Medicare HMO | Admitting: Family Medicine

## 2021-04-03 VITALS — BP 119/71 | HR 60 | Ht 65.5 in | Wt 152.5 lb

## 2021-04-03 DIAGNOSIS — Z7189 Other specified counseling: Secondary | ICD-10-CM

## 2021-04-03 DIAGNOSIS — R413 Other amnesia: Secondary | ICD-10-CM

## 2021-04-03 MED ORDER — DONEPEZIL HCL 10 MG PO TABS: 10.0000 mg | ORAL_TABLET | Freq: Every day | ORAL | 3 refills | Status: DC

## 2021-04-03 MED ORDER — MEMANTINE HCL 10 MG PO TABS: 10.0000 mg | ORAL_TABLET | Freq: Two times a day (BID) | ORAL | 3 refills | Status: DC

## 2021-04-03 NOTE — Progress Notes (Signed)
CM sent to [facility] 

## 2021-04-03 NOTE — Patient Instructions (Signed)
Please continue using your ASV regularly. While your insurance requires that you use ASV at least 4 hours each night on 70% of the nights, I recommend, that you not skip any nights and use it throughout the night if you can. Getting used to BiPAP and staying with the treatment long term does take time and patience and discipline. Untreated obstructive sleep apnea when it is moderate to severe can have an adverse impact on cardiovascular health and raise her risk for heart disease, arrhythmias, hypertension, congestive heart failure, stroke and diabetes. Untreated obstructive sleep apnea causes sleep disruption, nonrestorative sleep, and sleep deprivation. This can have an impact on your day to day functioning and cause daytime sleepiness and impairment of cognitive function, memory loss, mood disturbance, and problems focussing. Using ASV regularly can improve these symptoms.   Follow up in 6 months   Please use extreme caution with driving. The safest option is not to drive. If you do, please only drive very short, familiar, local distances to and from work. Consider OT evaluation to determine safety.   Management of Memory Problems   There are some general things you can do to help manage your memory problems.  Your memory may not in fact recover, but by using techniques and strategies you will be able to manage your memory difficulties better.   1)  Establish a routine. Try to establish and then stick to a regular routine.  By doing this, you will get used to what to expect and you will reduce the need to rely on your memory.  Also, try to do things at the same time of day, such as taking your medication or checking your calendar first thing in the morning. Think about think that you can do as a part of a regular routine and make a list.  Then enter them into a daily planner to remind you.  This will help you establish a routine.   2)  Organize your environment. Organize your environment so that it  is uncluttered.  Decrease visual stimulation.  Place everyday items such as keys or cell phone in the same place every day (ie.  Basket next to front door) Use post it notes with a brief message to yourself (ie. Turn off light, lock the door) Use labels to indicate where things go (ie. Which cupboards are for food, dishes, etc.) Keep a notepad and pen by the telephone to take messages   3)  Memory Aids A diary or journal/notebook/daily planner Making a list (shopping list, chore list, to do list that needs to be done) Using an alarm as a reminder (kitchen timer or cell phone alarm) Using cell phone to store information (Notes, Calendar, Reminders) Calendar/White board placed in a prominent position Post-it notes   In order for memory aids to be useful, you need to have good habits.  It's no good remembering to make a note in your journal if you don't remember to look in it.  Try setting aside a certain time of day to look in journal.   4)  Improving mood and managing fatigue. There may be other factors that contribute to memory difficulties.  Factors, such as anxiety, depression and tiredness can affect memory. Regular gentle exercise can help improve your mood and give you more energy. Simple relaxation techniques may help relieve symptoms of anxiety Try to get back to completing activities or hobbies you enjoyed doing in the past. Learn to pace yourself through activities to decrease fatigue. Find out  about some local support groups where you can share experiences with others. Try and achieve 7-8 hours of sleep at night.

## 2021-06-23 ENCOUNTER — Other Ambulatory Visit: Payer: Self-pay

## 2021-06-23 ENCOUNTER — Ambulatory Visit: Payer: Medicare HMO | Attending: Family Medicine

## 2021-06-23 DIAGNOSIS — R4701 Aphasia: Secondary | ICD-10-CM | POA: Insufficient documentation

## 2021-06-23 DIAGNOSIS — R41841 Cognitive communication deficit: Secondary | ICD-10-CM | POA: Diagnosis not present

## 2021-06-23 NOTE — Therapy (Signed)
[facility] [address] [place], [place], [address] Phone: [phone]   Fax:  [phone]  Speech Language Pathology Evaluation  Patient Details  Name: Carlos Pierce MRN: [id number] Date of Birth: 10-Jan-1941 Referring Provider (SLP): Nickola Major, MD   Encounter Date: 06/23/2021   End of Session - 06/23/21 0947     Visit Number 1    Number of Visits 25    Date for SLP Re-Evaluation 09/15/21    Authorization Type [employer]    SLP Start Time 0847    SLP Stop Time  0935    SLP Time Calculation (min) 48 min    Activity Tolerance Patient tolerated treatment well             Past Medical History:  Diagnosis Date   Memory loss     Past Surgical History:  Procedure Laterality Date   CATARACT EXTRACTION Bilateral    HEMORRHOID BANDING  2016    There were no vitals filed for this visit.       SLP Evaluation [facility] - 06/23/21 7017       SLP Visit Information   SLP Received On 05/25/21    Referring Provider (SLP) Nickola Major, MD    Onset Date ~2017; September 2019 - worsening aphasia    Medical Diagnosis Cognitive Communication Deficit; Aphasia      Subjective   Subjective "there is times I feel it's gotten better. There's times it's just there."    wife: "Some day it just flys by and some days it's a struggle"   Patient/Family Stated Goal "I'd like to get to the point that when I want to talk, what I say makes sense"    wife: "better concentration"     Pain Assessment   Currently in Pain? No/denies      General Information   HPI Carlos Pierce returns for follow up. Memory is stable on Aricept and Namenda. Memory loss progressing. Some days are better than other. He completed ST with neuro rehab for aphasia 03/14/21. He plans to check back in in January. He does feel that therapy has helped. He is better able to form sentences.      Balance Screen   Has the patient fallen in the  past 6 months No    Has the patient had a decrease in activity level because of a fear of falling?  No    Is the patient reluctant to leave their home because of a fear of falling?  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     Cognition   Overall Cognitive Status Impaired/Different from baseline   dementia   Area of Impairment Attention;Memory    Current Attention Level Focused    Attention Comments --   wife reports some changes in attention requiring usual repetiton to attend   Memory Decreased short-term memory    Memory Comments repeatedly asking same questions      Auditory Comprehension   Overall Auditory Comprehension Impaired    Yes/No Questions Impaired    Complex Questions 25-49% accurate    Commands Impaired for multi-step   Conversation Simple    Other Conversation Comments requires repetition  to ensure understanding of targeted question/response    Interfering Components Attention;Hearing;Motor planning;Processing speed;Working memory      Reading Comprehension   Reading Status Impaired    Word level 26-50% accurate    Sentence Level 26-50% accurate    Paragraph Level 26-50% accurate    Functional Environmental (signs, name badge) Impaired    Interfering Components Working Marine scientist;Attention;Processing time      Expression   Primary Mode of Expression Verbal      Verbal Expression   Overall Verbal Expression Impaired    Initiation Impaired    Level of Generative/Spontaneous Verbalization Conversation    Repetition Impaired    Level of Impairment Sentence level    Naming Impairment    Responsive 26-50% accurate    Confrontation 50-74% accurate    Divergent 0-24% accurate    Verbal Errors Phonemic paraphasias;Semantic  paraphasias;Inconsistent;Not aware of errors    Interfering Components Attention    Effective Techniques Semantic cues;Sentence completion;Phonemic cues;Written cues      Written Expression   Dominant Hand Right    Written Expression Exceptions to [facility]    Self Formulation Ability Word    Interfering Components Attention;Thought organization;Legibility      Oral Motor/Sensory Function   Overall Oral Motor/Sensory Function Impaired    Labial Coordination Reduced    Lingual Coordination Reduced      Motor Speech   Overall Motor Speech Impaired    Respiration Within functional limits    Articulation Impaired    Level of Impairment Word    Intelligibility Intelligible    Interfering Components Hearing loss    Effective Techniques Slow rate;Over-articulate;Pacing      Standardized Assessments   Standardized Assessments  Other Assessment   Quick Aphasia Battery=5.85 (moderate)                            SLP Education - 06/23/21 0947     Education Details eval results, possible goals, HEP    Person(s) Educated Patient;Spouse    Methods Explanation;Demonstration;Verbal cues    Comprehension Verbalized understanding;Returned demonstration;Need further instruction              SLP Short Term Goals - 06/23/21 1007       SLP SHORT TERM GOAL #1   Title Pt will utilize external memory/communication aids to facilitate naming and recall of personally relevant information given usual mod A over 2 sessions    Time 4    Period Weeks    Status New      SLP SHORT TERM GOAL #2   Title Pt will identify and correct phonemic paraphasias/articulation errors with usual mod A for 2/5 opportunities over 2 sessions    Time 4    Period Weeks    Status New      SLP SHORT TERM GOAL #3   Title Caregiver will utilize auditory processing/comprehension strategies to aid attention and comprehension in simple conversation given occasional min A over 2 sessions    Time 4     Period Weeks    Status New      SLP SHORT TERM GOAL #4   Title Pt will utilize word finding compensations in 5 minute simple conversation with usual mod A  over 2 sessions    Time 4    Period Weeks    Status New      SLP SHORT TERM GOAL #5   Title Caregivers will modify work environment (as needed) to compensate  for cognitive communication deficits with occasional min A over 2 sessions    Time 4    Period Weeks    Status New              SLP Long Term Goals - 06/23/21 1016       SLP LONG TERM GOAL #1   Title Pt will utilize external memory/communication aids to facilitate naming and recall of personally relevant information given occasional min A over 2 sessions    Time 8    Period Weeks    Status New      SLP LONG TERM GOAL #2   Title Pt will identify and correct phonemic paraphasias/articulation errors with usual mod A for 6/8 opportunities over 2 sessions    Time 12    Period Weeks    Status New      SLP LONG TERM GOAL #3   Title Caregiver will utilize auditory processing/comprehension strategies to aid attention and comprehension in simple conversation given rare min A over 2 sessions    Time 12    Period Weeks    Status New      SLP LONG TERM GOAL #4   Title Pt will utilize word finding compensations in 10 minute simple conversation with occasional mod A over 2 sessions    Time 12    Period Weeks    Status New      SLP LONG TERM GOAL #5   Title Pt will successfully continue work at family [employer] with family support and external communication/memory aids to optimize communication effectiveness by last ST session    Time 12    Period Weeks    Status New              Plan - 06/23/21 0948     Clinical Impression Statement "Carlos Pierce" was referred for OPST intervention to address worsening aphasia and progressing memory deficits related to dementia. Pt was accompanied by his wife, Carlos Pierce. Daily fluctuations in aphasia, memory, and attention reported, in  which some days are good and other days he "struggles." Family has continued to implement HEP at least 30 minutes per day. Noticable deficits exhibited if pt does not perform HEP consistently. In conversation today, pt exhibited intermittent anomia, usual vague language/empty speech, occasional phonemic paraphasias, and poor topic maintenance. Hearing does appear to play some role in reduced comprehension, but wife believes attention and memory has also worsened since last round of ST intervention. Wife is utilizing written FAQ for patient to reference as pt often asks the same questions repeatedly. Quick Aphasia Battery completed today to compare to baseline from May 2022 (previously scored 6.89=moderate). Today, pt scored 5.85 (moderate) which indicated slight decline in language skills. Usual delayed processing exhibited for increasingly complex yes/no questions. For picture naming, pt utilized description x3 but unable to name targeted word independently. Phonemic cues were occasionally effective to aid naming. Reduced ability to repeat or read multisyllabic words exhibited during evaluation. Reduced oral motor coordination exhibited for more complex words and speech production tasks. Due to decline in expressive aphasia, memory, and attention, pt would benefit from additional ST services to optimize current cognitive communciation for improved communication effectiveness and to increase QOL.    Speech Therapy Frequency 2x / week    Duration 12 weeks    Treatment/Interventions Compensatory strategies;Cueing hierarchy;Functional tasks;Patient/family education;Cognitive reorganization;Multimodal communcation approach;Language facilitation;Compensatory techniques;Internal/external aids;SLP instruction and feedback    Potential to Achieve Goals Fair    Potential Considerations Ability to learn/carryover  information;Previous level of function;Severity of impairments;Medical prognosis    SLP Home Exercise Plan  provided    Consulted and Agree with Plan of Care Patient;Family member/caregiver             Patient will benefit from skilled therapeutic intervention in order to improve the following deficits and impairments:   Cognitive communication deficit  Aphasia    Problem List Patient Active Problem List   Diagnosis Date Noted   Obstructive sleep apnea treated with continuous positive airway pressure (CPAP) 01/08/2017   Medication management 07/30/2016   Slow urinary stream 03/21/2016   Hyperlipidemia 06/22/2014   Mild HTN 01/16/2013   HYPERLIPIDEMIA 02/17/2008   HEMORRHOIDS-INTERNAL 02/17/2008   GERD 02/12/2008    Alinda Deem, CCC-SLP 06/23/2021, 12:47 PM  [facility] [address] [place], [place], [address] Phone: [phone]   Fax:  [phone]  Name: Carlos Pierce MRN: [id number] Date of Birth: August 19, 1940

## 2021-07-03 ENCOUNTER — Ambulatory Visit: Payer: Medicare HMO

## 2021-07-03 ENCOUNTER — Other Ambulatory Visit: Payer: Self-pay

## 2021-07-03 DIAGNOSIS — R41841 Cognitive communication deficit: Secondary | ICD-10-CM

## 2021-07-03 DIAGNOSIS — R4701 Aphasia: Secondary | ICD-10-CM

## 2021-07-03 NOTE — Therapy (Signed)
[facility] [address] [place], [place], [address] Phone: [phone]   Fax:  [phone]  Speech Language Pathology Treatment  Patient Details  Name: Carlos Pierce MRN: [id number] Date of Birth: April 30, 1941 Referring Provider (SLP): Nickola Major, MD   Encounter Date: 07/03/2021   End of Session - 07/03/21 0837     Visit Number 2    Number of Visits 25    Date for SLP Re-Evaluation 09/15/21    Authorization Type [employer]    SLP Start Time 0845    SLP Stop Time  0930    SLP Time Calculation (min) 45 min    Activity Tolerance Patient tolerated treatment well             Past Medical History:  Diagnosis Date   Memory loss     Past Surgical History:  Procedure Laterality Date   CATARACT EXTRACTION Bilateral    HEMORRHOID BANDING  2016    There were no vitals filed for this visit.   Subjective Assessment - 07/03/21 0841     Subjective "nothing"    Patient is accompained by: Family member    Currently in Pain? No/denies                   ADULT SLP TREATMENT - 07/03/21 0837       General Information   Behavior/Cognition Alert;Pleasant mood;Cooperative;Confused;Requires cueing      Treatment Provided   Treatment provided Cognitive-Linquistic      Cognitive-Linquistic Treatment   Treatment focused on Cognition;Aphasia;Patient/family/caregiver education    Skilled Treatment In conversation today, pt presented with intermittent vague language and rapid rate, which correlated to occasional paraphasias and anomia. SLP educated and instructed patient and wife on communication strategies, including verbal/non-verbal cues to slow rate to aid processing and word finding as well as wife providing increased prompting if vague language exhibited as listener becomes increasingly responsible for making inferences. Pt and wife verbalized understanding and agreement.      Assessment /  Recommendations / Plan   Plan Continue with current plan of care      Progression Toward Goals   Progression toward goals Progressing toward goals              SLP Education - 07/03/21 1112     Education Details slow rate, verbal/non-verbal cues, prompting if vague language exhibited    Person(s) Educated Patient;Spouse    Methods Explanation;Demonstration;Verbal cues    Comprehension Verbalized understanding;Returned demonstration;Need further instruction              SLP Short Term Goals - 07/03/21 6979       SLP SHORT TERM GOAL #1   Title Pt will utilize external memory/communication aids to facilitate naming and recall of personally relevant information given usual mod A over 2 sessions    Time 4    Period Weeks    Status On-going      SLP SHORT TERM GOAL #2   Title Pt will identify and correct phonemic paraphasias/articulation errors with usual mod A for 2/5 opportunities over 2 sessions    Time 4    Period Weeks    Status On-going      SLP SHORT TERM GOAL #3   Title Caregiver will utilize auditory processing/comprehension strategies to aid attention and comprehension in simple conversation given occasional min A over 2 sessions    Time 4    Period Weeks    Status On-going  SLP SHORT TERM GOAL #4   Title Pt will utilize word finding compensations in 5 minute simple conversation with usual mod A  over 2 sessions    Time 4    Period Weeks    Status On-going      SLP SHORT TERM GOAL #5   Title Caregivers will modify work environment (as needed) to compensate for cognitive communication deficits with occasional min A over 2 sessions    Time 4    Period Weeks    Status On-going              SLP Long Term Goals - 07/03/21 6294       SLP LONG TERM GOAL #1   Title Pt will utilize external memory/communication aids to facilitate naming and recall of personally relevant information given occasional min A over 2 sessions    Time 8    Period Weeks     Status On-going      SLP LONG TERM GOAL #2   Title Pt will identify and correct phonemic paraphasias/articulation errors with usual mod A for 6/8 opportunities over 2 sessions    Time 12    Period Weeks    Status On-going      SLP LONG TERM GOAL #3   Title Caregiver will utilize auditory processing/comprehension strategies to aid attention and comprehension in simple conversation given rare min A over 2 sessions    Time 12    Period Weeks    Status On-going      SLP LONG TERM GOAL #4   Title Pt will utilize word finding compensations in 10 minute simple conversation with occasional mod A over 2 sessions    Time 12    Period Weeks    Status On-going      SLP LONG TERM GOAL #5   Title Pt will successfully continue work at family [employer] with family support and external communication/memory aids to optimize communication effectiveness by last ST session    Time 12    Period Weeks    Status On-going              Plan - 07/03/21 0837     Clinical Impression Statement "Carlos Pierce" was referred for OPST intervention to address worsening aphasia and progressing memory deficits related to dementia. Pt was accompanied by his wife, [name]. Daily fluctuations in aphasia, memory, and attention reported, in which some days are good and other days he "struggles."  In conversation today, pt exhibited intermitent anomia, occasional vague language or empty speech, occasional phonemic paraphasias, and some reduced topic maintenance. SLP generated strategies to aid word finding and processing in conversation to improve word finding ability and listener comprehension. Due to decline in expressive aphasia, memory, and attention, pt would benefit from additional ST services to optimize current cognitive communciation for improved communication effectiveness and to increase QOL.    Speech Therapy Frequency 2x / week    Duration 12 weeks    Treatment/Interventions Compensatory strategies;Cueing  hierarchy;Functional tasks;Patient/family education;Cognitive reorganization;Multimodal communcation approach;Language facilitation;Compensatory techniques;Internal/external aids;SLP instruction and feedback    Potential to Achieve Goals Fair    Potential Considerations Ability to learn/carryover information;Previous level of function;Severity of impairments;Medical prognosis    SLP Home Exercise Plan provided    Consulted and Agree with Plan of Care Patient;Family member/caregiver             Patient will benefit from skilled therapeutic intervention in order to improve the following deficits and impairments:   Cognitive  communication deficit  Aphasia    Problem List Patient Active Problem List   Diagnosis Date Noted   Obstructive sleep apnea treated with continuous positive airway pressure (CPAP) 01/08/2017   Medication management 07/30/2016   Slow urinary stream 03/21/2016   Hyperlipidemia 06/22/2014   Mild HTN 01/16/2013   HYPERLIPIDEMIA 02/17/2008   HEMORRHOIDS-INTERNAL 02/17/2008   GERD 02/12/2008    Alinda Deem, CCC-SLP 07/03/2021, 11:15 AM  [facility] [address] [place], [place], [address] Phone: [phone]   Fax:  [phone]   Name: Carlos Pierce MRN: [id number] Date of Birth: 09-15-1940

## 2021-07-07 ENCOUNTER — Ambulatory Visit: Payer: Medicare HMO

## 2021-07-07 ENCOUNTER — Other Ambulatory Visit: Payer: Self-pay

## 2021-07-07 DIAGNOSIS — R41841 Cognitive communication deficit: Secondary | ICD-10-CM

## 2021-07-07 DIAGNOSIS — R4701 Aphasia: Secondary | ICD-10-CM

## 2021-07-07 NOTE — Therapy (Signed)
[facility] [address] [place], [place], [address] Phone: [phone]   Fax:  [phone]  Speech Language Pathology Treatment  Patient Details  Name: Carlos Pierce MRN: [id number] Date of Birth: [birth year] Referring Provider (SLP): Nickola Major, MD   Encounter Date: 07/07/2021   End of Session - 07/07/21 1427     Visit Number 3    Number of Visits 25    Date for SLP Re-Evaluation 09/15/21    Authorization Type [employer]    SLP Start Time 1430    SLP Stop Time  1520    SLP Time Calculation (min) 50 min    Activity Tolerance Patient tolerated treatment well             Past Medical History:  Diagnosis Date   Memory loss     Past Surgical History:  Procedure Laterality Date   CATARACT EXTRACTION Bilateral    HEMORRHOID BANDING  2016    There were no vitals filed for this visit.   Subjective Assessment - 07/07/21 1532     Subjective "we went [place] the place ([facility]    Currently in Pain? No/denies                   ADULT SLP TREATMENT - 07/07/21 1427       General Information   Behavior/Cognition Alert;Pleasant mood;Cooperative;Confused;Requires cueing      Treatment Provided   Treatment provided Cognitive-Linquistic      Cognitive-Linquistic Treatment   Treatment focused on Cognition;Aphasia;Patient/family/caregiver education    Skilled Treatment SLP targeted cued slow rate to aid processing and word finding on structured task to name pictures in sequence. Occasional cues required to slow rate, in which pt able to correct errors and anomia with improved accuracy. In conversation, SLP provided intermittent non-verbal cues to slow rate, which was occasionally effective. As tasks and session progressed, increased word finding exhibited which appeared to be correlated to fatigue. Increased first letter and phonemic cues required to aid naming. Wife endorsed  improvements in verbal expression given targeted activities for HEP.      Assessment / Recommendations / Plan   Plan Continue with current plan of care      Progression Toward Goals   Progression toward goals Progressing toward goals              SLP Education - 07/07/21 1529     Education Details slow down to aid processing and word finding, HEP    Person(s) Educated Patient;Spouse    Methods Explanation;Demonstration;Verbal cues    Comprehension Verbalized understanding;Returned demonstration;Need further instruction              SLP Short Term Goals - 07/07/21 1428       SLP SHORT TERM GOAL #1   Title Pt will utilize external memory/communication aids to facilitate naming and recall of personally relevant information given usual mod A over 2 sessions    Time 4    Period Weeks    Status On-going      SLP SHORT TERM GOAL #2   Title Pt will identify and correct phonemic paraphasias/articulation errors with usual mod A for 2/5 opportunities on structured speech tasks over 2 sessions    Baseline 07-07-21    Time 4    Period Weeks    Status On-going   revised     SLP SHORT TERM GOAL #3   Title Caregiver will utilize auditory processing/comprehension strategies to aid  attention and comprehension in simple conversation given occasional min A over 2 sessions    Baseline 07-07-21    Time 4    Period Weeks    Status On-going      SLP SHORT TERM GOAL #4   Title Pt will utilize word finding compensations in 5 minute simple conversation with usual mod A  over 2 sessions    Time 4    Period Weeks    Status On-going      SLP SHORT TERM GOAL #5   Title Caregivers will modify work environment (as needed) to compensate for cognitive communication deficits with occasional min A over 2 sessions    Time 4    Period Weeks    Status On-going              SLP Long Term Goals - 07/07/21 Spry #1   Title Pt will utilize external  memory/communication aids to facilitate naming and recall of personally relevant information given occasional min A over 2 sessions    Time 8    Period Weeks    Status On-going      SLP LONG TERM GOAL #2   Title Pt will identify and correct phonemic paraphasias/articulation errors with usual mod A for 6/8 opportunities over 2 sessions    Time 12    Period Weeks    Status On-going      SLP LONG TERM GOAL #3   Title Caregiver will utilize auditory processing/comprehension strategies to aid attention and comprehension in simple conversation given rare min A over 2 sessions    Time 12    Period Weeks    Status On-going      SLP LONG TERM GOAL #4   Title Pt will utilize word finding compensations in 10 minute simple conversation with occasional mod A over 2 sessions    Time 12    Period Weeks    Status On-going      SLP LONG TERM GOAL #5   Title Pt will successfully continue work at family [employer] with family support and external communication/memory aids to optimize communication effectiveness by last ST session    Time 12    Period Weeks    Status On-going              Plan - 07/07/21 1428     Clinical Impression Statement "Carlos Pierce" was referred for OPST intervention to address worsening aphasia and progressing memory deficits related to dementia. SLP targeted and instructed strategies to aid word finding and processing on structured tasks and in conversation to improve word finding ability, which was effective. Due to decline in expressive aphasia, memory, and attention, pt would benefit from additional ST services to optimize current cognitive communciation for improved communication effectiveness and to increase QOL.    Speech Therapy Frequency 2x / week    Duration 12 weeks    Treatment/Interventions Compensatory strategies;Cueing hierarchy;Functional tasks;Patient/family education;Cognitive reorganization;Multimodal communcation approach;Language facilitation;Compensatory  techniques;Internal/external aids;SLP instruction and feedback    Potential to Achieve Goals Fair    Potential Considerations Ability to learn/carryover information;Previous level of function;Severity of impairments;Medical prognosis    SLP Home Exercise Plan provided    Consulted and Agree with Plan of Care Patient;Family member/caregiver             Patient will benefit from skilled therapeutic intervention in order to improve the following deficits and impairments:   Cognitive communication deficit  Aphasia  Problem List Patient Active Problem List   Diagnosis Date Noted   Obstructive sleep apnea treated with continuous positive airway pressure (CPAP) 01/08/2017   Medication management 07/30/2016   Slow urinary stream 03/21/2016   Hyperlipidemia 06/22/2014   Mild HTN 01/16/2013   HYPERLIPIDEMIA 02/17/2008   HEMORRHOIDS-INTERNAL 02/17/2008   GERD 02/12/2008    Alinda Deem, CCC-SLP 07/07/2021, 3:32 PM  [facility] [address] [place], [place], [address] Phone: [phone]   Fax:  [phone]   Name: Carlos Pierce MRN: [id number] Date of Birth: 1941-03-07

## 2021-07-10 ENCOUNTER — Other Ambulatory Visit: Payer: Self-pay

## 2021-07-10 ENCOUNTER — Ambulatory Visit: Payer: Medicare HMO

## 2021-07-10 DIAGNOSIS — R41841 Cognitive communication deficit: Secondary | ICD-10-CM | POA: Diagnosis not present

## 2021-07-10 DIAGNOSIS — R4701 Aphasia: Secondary | ICD-10-CM

## 2021-07-10 NOTE — Therapy (Signed)
[facility] [address] [place], [place], [address] Phone: [phone]   Fax:  [phone]  Speech Language Pathology Treatment  Patient Details  Name: Carlos Pierce MRN: [id number] Date of Birth: [birth year] Referring Provider (SLP): Nickola Major, MD   Encounter Date: 07/10/2021   End of Session - 07/10/21 0837     Visit Number 4    Number of Visits 25    Date for SLP Re-Evaluation 09/15/21    Authorization Type [employer]    SLP Start Time 0845    SLP Stop Time  0928    SLP Time Calculation (min) 43 min    Activity Tolerance Patient tolerated treatment well             Past Medical History:  Diagnosis Date   Memory loss     Past Surgical History:  Procedure Laterality Date   CATARACT EXTRACTION Bilateral    HEMORRHOID BANDING  2016    There were no vitals filed for this visit.   Subjective Assessment - 07/10/21 0838     Subjective "rain"    Patient is accompained by: Family member    Currently in Pain? No/denies                   ADULT SLP TREATMENT - 07/10/21 0836       General Information   Behavior/Cognition Alert;Pleasant mood;Cooperative;Confused;Requires cueing      Treatment Provided   Treatment provided Cognitive-Linquistic      Cognitive-Linquistic Treatment   Treatment focused on Cognition;Aphasia;Patient/family/caregiver education    Skilled Treatment SLP targeted reading aloud as warm up as pt exhibited some increased word finding and delayed processing for morning session. SLP instructed slow rate to aid reading accuracy and processing, which was effective. Pt able to self-correct omissions or paraphasias with increasing accuracy when utilizing slow rate. Pt exhibited tangential comment x1, in which SLP redirected patient to task. Wife indicated with cued slow has also benefited his spelling. SLP targeted naming tasks to determine odd object out. Pt  required intermittent mod to max A to aid naming and occasional min A to determine odd object out. SLP again cued slow rate to aid processing and correction of paraphasias, which was effective.      Assessment / Recommendations / Plan   Plan Continue with current plan of care      Progression Toward Goals   Progression toward goals Progressing toward goals              SLP Education - 07/10/21 1121     Education Details slow down to aid processing and word finding, HEP    Person(s) Educated Patient;Spouse    Methods Explanation;Demonstration;Verbal cues;Handout    Comprehension Verbalized understanding;Returned demonstration;Need further instruction              SLP Short Term Goals - 07/10/21 0837       SLP SHORT TERM GOAL #1   Title Pt will utilize external memory/communication aids to facilitate naming and recall of personally relevant information given usual mod A over 2 sessions    Time 3    Period Weeks    Status On-going      SLP SHORT TERM GOAL #2   Title Pt will identify and correct phonemic paraphasias/articulation errors with usual mod A for 2/5 opportunities on structured speech tasks over 2 sessions    Baseline 07-07-21    Time 3    Period Weeks  Status On-going      SLP SHORT TERM GOAL #3   Title Caregiver will utilize auditory processing/comprehension strategies to aid attention and comprehension in simple conversation given occasional min A over 2 sessions    Baseline 07-07-21    Time 3    Period Weeks    Status On-going      SLP SHORT TERM GOAL #4   Title Pt will utilize word finding compensations in 5 minute simple conversation with usual mod A  over 2 sessions    Time 3    Period Weeks    Status On-going      SLP SHORT TERM GOAL #5   Title Caregivers will modify work environment (as needed) to compensate for cognitive communication deficits with occasional min A over 2 sessions    Time 3    Period Weeks    Status On-going               SLP Long Term Goals - 07/10/21 0837       SLP LONG TERM GOAL #1   Title Pt will utilize external memory/communication aids to facilitate naming and recall of personally relevant information given occasional min A over 2 sessions    Time 7    Period Weeks    Status On-going      SLP LONG TERM GOAL #2   Title Pt will identify and correct phonemic paraphasias/articulation errors with usual mod A for 6/8 opportunities over 2 sessions    Time 11    Period Weeks    Status On-going      SLP LONG TERM GOAL #3   Title Caregiver will utilize auditory processing/comprehension strategies to aid attention and comprehension in simple conversation given rare min A over 2 sessions    Time 11    Period Weeks    Status On-going      SLP LONG TERM GOAL #4   Title Pt will utilize word finding compensations in 10 minute simple conversation with occasional mod A over 2 sessions    Time 11    Period Weeks    Status On-going      SLP LONG TERM GOAL #5   Title Pt will successfully continue work at [employer] with family support and external communication/memory aids to optimize communication effectiveness by last ST session    Time 11    Period Weeks    Status On-going              Plan - 07/10/21 0837     Clinical Impression Statement "Carlos Pierce" was referred for OPST intervention to address aphasia and progressing memory deficits related to dementia. SLP targeted and instructed strategies to aid word finding and processing on structured tasks and in conversation to improve word finding ability. Cued slow rate has been beneficial to optimize processing speed, word finding ability, and pt independent ability to self correct errors. Due to decline in expressive aphasia, memory, and attention, pt would benefit from additional ST services to optimize current cognitive communciation for improved communication effectiveness and to increase QOL.    Speech Therapy Frequency 2x / week    Duration  12 weeks    Treatment/Interventions Compensatory strategies;Cueing hierarchy;Functional tasks;Patient/family education;Cognitive reorganization;Multimodal communcation approach;Language facilitation;Compensatory techniques;Internal/external aids;SLP instruction and feedback    Potential to Achieve Goals Fair    Potential Considerations Ability to learn/carryover information;Previous level of function;Severity of impairments;Medical prognosis    SLP Home Exercise Plan provided    Consulted and Agree with  Plan of Care Patient;Family member/caregiver             Patient will benefit from skilled therapeutic intervention in order to improve the following deficits and impairments:   Cognitive communication deficit  Aphasia    Problem List Patient Active Problem List   Diagnosis Date Noted   Obstructive sleep apnea treated with continuous positive airway pressure (CPAP) 01/08/2017   Medication management 07/30/2016   Slow urinary stream 03/21/2016   Hyperlipidemia 06/22/2014   Mild HTN 01/16/2013   HYPERLIPIDEMIA 02/17/2008   HEMORRHOIDS-INTERNAL 02/17/2008   GERD 02/12/2008    Alinda Deem, CCC-SLP 07/10/2021, 11:23 AM  [facility] [address] [place], [place], [address] Phone: [phone]   Fax:  [phone]   Name: CODIE KROGH MRN: [id number] Date of Birth: 1941-05-12

## 2021-07-13 ENCOUNTER — Ambulatory Visit: Payer: Medicare HMO | Attending: Family Medicine

## 2021-07-13 ENCOUNTER — Other Ambulatory Visit: Payer: Self-pay

## 2021-07-13 DIAGNOSIS — R4701 Aphasia: Secondary | ICD-10-CM | POA: Insufficient documentation

## 2021-07-13 DIAGNOSIS — R41841 Cognitive communication deficit: Secondary | ICD-10-CM | POA: Insufficient documentation

## 2021-07-13 NOTE — Therapy (Signed)
[facility] [address] [place], [place], [address] Phone: [phone]   Fax:  [phone]  Speech Language Pathology Treatment  Patient Details  Name: Carlos Pierce MRN: [id number] Date of Birth: 03-09-1941 Referring Provider (SLP): Nickola Major, MD   Encounter Date: 07/13/2021   End of Session - 07/13/21 1357     Visit Number 5    Number of Visits 25    Date for SLP Re-Evaluation 09/15/21    Authorization Type [employer]    SLP Start Time 8416    SLP Stop Time  1443    SLP Time Calculation (min) 45 min    Activity Tolerance Patient tolerated treatment well             Past Medical History:  Diagnosis Date   Memory loss     Past Surgical History:  Procedure Laterality Date   CATARACT EXTRACTION Bilateral    HEMORRHOID BANDING  2016    There were no vitals filed for this visit.   Subjective Assessment - 07/13/21 1401     Subjective "well it was raining"    Patient is accompained by: Family member   wife   Currently in Pain? No/denies                   ADULT SLP TREATMENT - 07/13/21 1357       General Information   Behavior/Cognition Alert;Pleasant mood;Cooperative;Confused;Requires cueing      Treatment Provided   Treatment provided Cognitive-Linquistic      Cognitive-Linquistic Treatment   Treatment focused on Cognition;Aphasia;Patient/family/caregiver education    Skilled Treatment In opening conversation, pt presented with occasional vague language when anomia occured. SLP utilized moderate prompting to elicit targeted verb, which was effective to improve listener comprehension and pt verbal expression. In oral reading, SLP assisted by cuing slow rate and providing intermittent min A to identify errors. Occasional deviations in attention noted, in which SLP provided prompting to redirect attention (i.e. establish eye contact, remove external stimuli).       Assessment / Recommendations / Plan   Plan Continue with current plan of care      Progression Toward Goals   Progression toward goals Progressing toward goals                SLP Short Term Goals - 07/13/21 1358       SLP SHORT TERM GOAL #1   Title Pt will utilize external memory/communication aids to facilitate naming and recall of personally relevant information given usual mod A over 2 sessions    Time 3    Period Weeks    Status On-going      SLP SHORT TERM GOAL #2   Title Pt will identify and correct phonemic paraphasias/articulation errors with usual mod A for 2/5 opportunities on structured speech tasks over 2 sessions    Baseline 07-07-21    Time 3    Period Weeks    Status On-going      SLP SHORT TERM GOAL #3   Title Caregiver will utilize auditory processing/comprehension strategies to aid attention and comprehension in simple conversation given occasional min A over 2 sessions    Baseline 07-07-21    Time 3    Period Weeks    Status On-going      SLP SHORT TERM GOAL #4   Title Pt will utilize word finding compensations in 5 minute simple conversation with usual mod A  over 2 sessions  Baseline 07-13-21    Time 3    Period Weeks    Status On-going      SLP SHORT TERM GOAL #5   Title Caregivers will modify work environment (as needed) to compensate for cognitive communication deficits with occasional min A over 2 sessions    Time 3    Period Weeks    Status On-going              SLP Long Term Goals - 07/13/21 [place] #1   Title Pt will utilize external memory/communication aids to facilitate naming and recall of personally relevant information given occasional min A over 2 sessions    Time 7    Period Weeks    Status On-going      SLP LONG TERM GOAL #2   Title Pt will identify and correct phonemic paraphasias/articulation errors with usual mod A for 6/8 opportunities over 2 sessions    Time 11    Period Weeks    Status  On-going      SLP LONG TERM GOAL #3   Title Caregiver will utilize auditory processing/comprehension strategies to aid attention and comprehension in simple conversation given rare min A over 2 sessions    Time 11    Period Weeks    Status On-going      SLP LONG TERM GOAL #4   Title Pt will utilize word finding compensations in 10 minute simple conversation with occasional mod A over 2 sessions    Time 11    Period Weeks    Status On-going      SLP LONG TERM GOAL #5   Title Pt will successfully continue work at [employer] with family support and external communication/memory aids to optimize communication effectiveness by last ST session    Time 11    Period Weeks    Status On-going              Plan - 07/13/21 1358     Clinical Impression Statement "Carlos Pierce" was referred for OPST intervention to address aphasia and progressing memory deficits related to dementia. SLP targeted and instructed strategies to aid word finding and processing on structured tasks and in conversation to improve word finding ability. Cued slow rate has been beneficial to optimize processing speed, word finding ability, and pt independent ability to self correct errors. Due to decline in expressive aphasia, memory, and attention, pt would benefit from additional ST services to optimize current cognitive communciation for improved communication effectiveness and to increase QOL.    Speech Therapy Frequency 2x / week    Duration 12 weeks    Treatment/Interventions Compensatory strategies;Cueing hierarchy;Functional tasks;Patient/family education;Cognitive reorganization;Multimodal communcation approach;Language facilitation;Compensatory techniques;Internal/external aids;SLP instruction and feedback    Potential to Achieve Goals Fair    Potential Considerations Ability to learn/carryover information;Previous level of function;Severity of impairments;Medical prognosis    SLP Home Exercise Plan provided     Consulted and Agree with Plan of Care Patient;Family member/caregiver             Patient will benefit from skilled therapeutic intervention in order to improve the following deficits and impairments:   Cognitive communication deficit  Aphasia    Problem List Patient Active Problem List   Diagnosis Date Noted   Obstructive sleep apnea treated with continuous positive airway pressure (CPAP) 01/08/2017   Medication management 07/30/2016   Slow urinary stream 03/21/2016   Hyperlipidemia 06/22/2014   Mild HTN  01/16/2013   HYPERLIPIDEMIA 02/17/2008   HEMORRHOIDS-INTERNAL 02/17/2008   GERD 02/12/2008    Alinda Deem, CCC-SLP 07/13/2021, 4:12 PM  [facility] [address] [place], [place], [address] Phone: [phone]   Fax:  [phone]   Name: DIONICIO SHELNUTT MRN: [id number] Date of Birth: 11-03-1940

## 2021-07-17 ENCOUNTER — Other Ambulatory Visit: Payer: Self-pay

## 2021-07-17 ENCOUNTER — Ambulatory Visit: Payer: Medicare HMO

## 2021-07-17 DIAGNOSIS — R4701 Aphasia: Secondary | ICD-10-CM

## 2021-07-17 DIAGNOSIS — R41841 Cognitive communication deficit: Secondary | ICD-10-CM | POA: Diagnosis not present

## 2021-07-17 NOTE — Therapy (Signed)
[facility] [address] [place], [place], [address] Phone: [phone]   Fax:  [phone]  Speech Language Pathology Treatment  Patient Details  Name: Carlos Pierce MRN: [id number] Date of Birth: 09-Jul-1940 Referring Provider (SLP): Nickola Major, MD   Encounter Date: 07/17/2021   End of Session - 07/17/21 0837     Visit Number 6    Number of Visits 25    Date for SLP Re-Evaluation 09/15/21    Authorization Type [employer]    Authorization Time Period requested auth for 16 visits    Authorization - Visit Number 5    Authorization - Number of Visits 16    SLP Start Time 0845    SLP Stop Time  0932    SLP Time Calculation (min) 47 min    Activity Tolerance Patient tolerated treatment well             Past Medical History:  Diagnosis Date   Memory loss     Past Surgical History:  Procedure Laterality Date   CATARACT EXTRACTION Bilateral    HEMORRHOID BANDING  2016    There were no vitals filed for this visit.   Subjective Assessment - 07/17/21 0845     Subjective "same old"    Patient is accompained by: Family member   wife, [name]   Currently in Pain? No/denies                   ADULT SLP TREATMENT - 07/17/21 0837       General Information   Behavior/Cognition Alert;Pleasant mood;Cooperative;Confused;Requires cueing      Treatment Provided   Treatment provided Cognitive-Linquistic      Cognitive-Linquistic Treatment   Treatment focused on Cognition;Aphasia;Patient/family/caregiver education    Skilled Treatment Pt returned with majority of HEP completed. Wife endorsed having to scaffold task to aid patient comprehension of task (homophones). In review, pt read aloud sentences with intermittent ability to self-correct errors although occasional cues required. Some impaired articulation exhibited (final consonant deletion), in which pt exhibited reduced awareness. SLP  utilized minimal pairs to aid comprehension, which was intermittently effective. Pt c/o sometimes not understanding customers at work due to fast rate, in which SLP provided education re: advocating for self and requesting repetition or clarification as needed. SLP generated general responses to say in these occurances, in which pt able to read with good accuracy. Additional education and practice may be warranted to assist patient with self-advocating when slow processing or reduced comprehension noted.      Assessment / Recommendations / Plan   Plan Continue with current plan of care      Progression Toward Goals   Progression toward goals Progressing toward goals              SLP Education - 07/17/21 0941     Education Details self-advocate, repetition/clarification phrases, slow down    Person(s) Educated Patient;Spouse    Methods Explanation;Demonstration;Verbal cues;Handout    Comprehension Verbalized understanding;Returned demonstration;Need further instruction;Verbal cues required              SLP Short Term Goals - 07/17/21 2751       SLP SHORT TERM GOAL #1   Title Pt will utilize external memory/communication aids to facilitate naming and recall of personally relevant information given usual mod A over 2 sessions    Time 2    Period Weeks    Status On-going      SLP SHORT TERM  GOAL #2   Title Pt will identify and correct phonemic paraphasias/articulation errors with usual mod A for 2/5 opportunities on structured speech tasks over 2 sessions    Baseline 07-07-21, 07-17-21    Time 2    Period Weeks    Status Achieved      SLP SHORT TERM GOAL #3   Title Caregiver will utilize auditory processing/comprehension strategies to aid attention and comprehension in simple conversation given occasional min A over 2 sessions    Baseline 07-07-21    Time 2    Period Weeks    Status On-going      SLP SHORT TERM GOAL #4   Title Pt will utilize word finding compensations in 5  minute simple conversation with usual mod A  over 2 sessions    Baseline 07-13-21    Time 2    Period Weeks    Status On-going      SLP SHORT TERM GOAL #5   Title Caregivers will modify work environment (as needed) to compensate for cognitive communication deficits with occasional min A over 2 sessions    Time 2    Period Weeks    Status On-going              SLP Long Term Goals - 07/17/21 6237       SLP LONG TERM GOAL #1   Title Pt will utilize external memory/communication aids to facilitate naming and recall of personally relevant information given occasional min A over 2 sessions    Time 6    Period Weeks    Status On-going      SLP LONG TERM GOAL #2   Title Pt will identify and correct phonemic paraphasias/articulation errors with usual mod A for 6/8 opportunities over 2 sessions    Time 10    Period Weeks    Status On-going      SLP LONG TERM GOAL #3   Title Caregiver will utilize auditory processing/comprehension strategies to aid attention and comprehension in simple conversation given rare min A over 2 sessions    Time 10    Period Weeks    Status On-going      SLP LONG TERM GOAL #4   Title Pt will utilize word finding compensations in 10 minute simple conversation with occasional mod A over 2 sessions    Time 10    Period Weeks    Status On-going      SLP LONG TERM GOAL #5   Title Pt will successfully continue work at [employer] with family support and external communication/memory aids to optimize communication effectiveness by last ST session    Time 10    Period Weeks    Status On-going              Plan - 07/17/21 6283     Clinical Impression Statement "[name]" was referred for OPST intervention to address aphasia and memory deficits related to dementia. SLP targeted and instructed strategies to aid word finding and fluency on structured tasks. Cued slow rate has been beneficial to optimize processing speed, word finding ability, and pt  independent ability to self correct errors. SLP generated appropriate responses to use at work for self-advocacy when reduced comprehension or slow processing noted. Due to decline in expressive aphasia, memory, and attention, pt would benefit from additional ST services to optimize current cognitive communciation for improved communication effectiveness and to increase QOL.    Speech Therapy Frequency 2x / week  Duration 12 weeks    Treatment/Interventions Compensatory strategies;Cueing hierarchy;Functional tasks;Patient/family education;Cognitive reorganization;Multimodal communcation approach;Language facilitation;Compensatory techniques;Internal/external aids;SLP instruction and feedback    Potential to Achieve Goals Fair    Potential Considerations Ability to learn/carryover information;Previous level of function;Severity of impairments;Medical prognosis    SLP Home Exercise Plan provided    Consulted and Agree with Plan of Care Patient;Family member/caregiver             Patient will benefit from skilled therapeutic intervention in order to improve the following deficits and impairments:   Cognitive communication deficit  Aphasia    Problem List Patient Active Problem List   Diagnosis Date Noted   Obstructive sleep apnea treated with continuous positive airway pressure (CPAP) 01/08/2017   Medication management 07/30/2016   Slow urinary stream 03/21/2016   Hyperlipidemia 06/22/2014   Mild HTN 01/16/2013   HYPERLIPIDEMIA 02/17/2008   HEMORRHOIDS-INTERNAL 02/17/2008   GERD 02/12/2008    Alinda Deem, CCC-SLP 07/17/2021, 9:45 AM  [facility] [address] [place], [place], [address] Phone: [phone]   Fax:  [phone]   Name: BERNON ARVISO MRN: [id number] Date of Birth: [birth year]

## 2021-07-17 NOTE — Patient Instructions (Signed)
If you don't understand someone, you can politely interrupt and say...  "I'm sorry. I didn't hear that last sentence. Do you mind repeating?"  "I have a hard time hearing. Can you repeat that?"  "Do you mind repeating that? I had trouble hearing you."

## 2021-07-18 ENCOUNTER — Other Ambulatory Visit: Payer: Self-pay | Admitting: Cardiovascular Disease

## 2021-07-21 ENCOUNTER — Ambulatory Visit: Payer: Medicare HMO

## 2021-07-21 ENCOUNTER — Other Ambulatory Visit: Payer: Self-pay

## 2021-07-21 DIAGNOSIS — R41841 Cognitive communication deficit: Secondary | ICD-10-CM | POA: Diagnosis not present

## 2021-07-21 DIAGNOSIS — R4701 Aphasia: Secondary | ICD-10-CM

## 2021-07-21 NOTE — Therapy (Signed)
[facility] [address] [place], [place], [address] Phone: [phone]   Fax:  [phone]  Speech Language Pathology Treatment  Patient Details  Name: Carlos Pierce MRN: [id number] Date of Birth: 1940/08/02 Referring Provider (SLP): Nickola Major, MD   Encounter Date: 07/21/2021   End of Session - 07/21/21 1518     Visit Number 7    Number of Visits 25    Date for SLP Re-Evaluation 09/15/21    Authorization Type [employer]    Authorization Time Period requested auth for 16 visits    Authorization - Visit Number 6    Authorization - Number of Visits 16    SLP Start Time 5462    SLP Stop Time  1607    SLP Time Calculation (min) 42 min    Activity Tolerance Patient tolerated treatment well             Past Medical History:  Diagnosis Date   Memory loss     Past Surgical History:  Procedure Laterality Date   CATARACT EXTRACTION Bilateral    HEMORRHOID BANDING  2016    There were no vitals filed for this visit.   Subjective Assessment - 07/21/21 1524     Subjective "I didn't go to work today"    Patient is accompained by: Family member    Currently in Pain? No/denies                   ADULT SLP TREATMENT - 07/21/21 1518       General Information   Behavior/Cognition Alert;Pleasant mood;Cooperative;Confused;Requires cueing      Treatment Provided   Treatment provided Cognitive-Linquistic      Cognitive-Linquistic Treatment   Treatment focused on Cognition;Aphasia;Patient/family/caregiver education    Skilled Treatment Pt exhibited improved reading today with use of slow rate to aid accuracy and self-corrections. Occasional vague/empty speech exhibited in conversation requiring intermittent prompting and occasional max SLP cues to aid word finding. Fatigue and frustration appeared to be contributing factor in increased word finding today. Written cues were rarely  effective, but phonemic cues and sentence completions were successful. SLP added to HEP.      Assessment / Recommendations / Plan   Plan Continue with current plan of care      Progression Toward Goals   Progression toward goals Progressing toward goals              SLP Education - 07/21/21 1615     Education Details HEP, cueing    Person(s) Educated Patient;Spouse    Methods Explanation;Demonstration;Verbal cues;Handout    Comprehension Verbalized understanding;Returned demonstration;Verbal cues required;Need further instruction              SLP Short Term Goals - 07/21/21 1518       SLP SHORT TERM GOAL #1   Title Pt will utilize external memory/communication aids to facilitate naming and recall of personally relevant information given usual mod A over 2 sessions    Time 2    Period Weeks    Status On-going      SLP SHORT TERM GOAL #2   Title Pt will identify and correct phonemic paraphasias/articulation errors with usual mod A for 2/5 opportunities on structured speech tasks over 2 sessions    Baseline 07-07-21, 07-17-21    Time 2    Period Weeks    Status Achieved      SLP SHORT TERM GOAL #3   Title Caregiver will utilize  auditory processing/comprehension strategies to aid attention and comprehension in simple conversation given occasional min A over 2 sessions    Baseline 07-07-21, 07-21-21    Time 2    Period Weeks    Status Achieved      SLP SHORT TERM GOAL #4   Title Pt will utilize word finding compensations in 5 minute simple conversation with usual mod A  over 2 sessions    Baseline 07-13-21    Time 2    Period Weeks    Status On-going      SLP SHORT TERM GOAL #5   Title Caregivers will modify work environment (as needed) to compensate for cognitive communication deficits with occasional min A over 2 sessions    Time 2    Period Weeks    Status On-going              SLP Long Term Goals - 07/21/21 1518       SLP LONG TERM GOAL #1   Title Pt  will utilize external memory/communication aids to facilitate naming and recall of personally relevant information given occasional min A over 2 sessions    Time 6    Period Weeks    Status On-going      SLP LONG TERM GOAL #2   Title Pt will identify and correct phonemic paraphasias/articulation errors with usual mod A for 6/8 opportunities over 2 sessions    Time 10    Period Weeks    Status On-going      SLP LONG TERM GOAL #3   Title Caregiver will utilize auditory processing/comprehension strategies to aid attention and comprehension in simple conversation given rare min A over 2 sessions    Time 10    Period Weeks    Status On-going      SLP LONG TERM GOAL #4   Title Pt will utilize word finding compensations in 10 minute simple conversation with occasional mod A over 2 sessions    Time 10    Period Weeks    Status On-going      SLP LONG TERM GOAL #5   Title Pt will successfully continue work at [employer] with family support and external communication/memory aids to optimize communication effectiveness by last ST session    Time 10    Period Weeks    Status On-going              Plan - 07/21/21 1518     Clinical Impression Statement "[name]" was referred for OPST intervention to address aphasia and memory deficits related to dementia. SLP targeted and instructed strategies to aid word finding and fluency on structured tasks. Cued slow rate has been beneficial to optimize processing speed, word finding ability, and pt independent ability to self correct errors. Due to decline in expressive aphasia, memory, and attention, pt would benefit from additional ST services to optimize current cognitive communciation for improved communication effectiveness and to increase QOL.    Speech Therapy Frequency 2x / week    Duration 12 weeks    Treatment/Interventions Compensatory strategies;Cueing hierarchy;Functional tasks;Patient/family education;Cognitive  reorganization;Multimodal communcation approach;Language facilitation;Compensatory techniques;Internal/external aids;SLP instruction and feedback    Potential to Achieve Goals Fair    Potential Considerations Ability to learn/carryover information;Previous level of function;Severity of impairments;Medical prognosis    SLP Home Exercise Plan provided    Consulted and Agree with Plan of Care Patient;Family member/caregiver             Patient will benefit from  skilled therapeutic intervention in order to improve the following deficits and impairments:   Cognitive communication deficit  Aphasia    Problem List Patient Active Problem List   Diagnosis Date Noted   Obstructive sleep apnea treated with continuous positive airway pressure (CPAP) 01/08/2017   Medication management 07/30/2016   Slow urinary stream 03/21/2016   Hyperlipidemia 06/22/2014   Mild HTN 01/16/2013   HYPERLIPIDEMIA 02/17/2008   HEMORRHOIDS-INTERNAL 02/17/2008   GERD 02/12/2008    Alinda Deem, CCC-SLP 07/21/2021, 4:16 PM  [facility] [address] [place], [place], [address] Phone: [phone]   Fax:  [phone]   Name: MIKEN STECHER MRN: [id number] Date of Birth: [birth year]

## 2021-07-24 ENCOUNTER — Ambulatory Visit: Payer: Medicare HMO

## 2021-07-24 ENCOUNTER — Other Ambulatory Visit: Payer: Self-pay

## 2021-07-24 DIAGNOSIS — R41841 Cognitive communication deficit: Secondary | ICD-10-CM

## 2021-07-24 DIAGNOSIS — R4701 Aphasia: Secondary | ICD-10-CM

## 2021-07-24 NOTE — Therapy (Signed)
[facility] [address] [place], [place], [address] Phone: [phone]   Fax:  [phone]  Speech Language Pathology Treatment  Patient Details  Name: Carlos Pierce MRN: [id number] Date of Birth: [birth year] Referring Provider (SLP): Nickola Major, MD   Encounter Date: 07/24/2021   End of Session - 07/24/21 0839     Visit Number 8    Number of Visits 25    Date for SLP Re-Evaluation 09/15/21    Authorization Type [employer]    Authorization Time Period requested auth for 16 visits    Authorization - Visit Number 7    Authorization - Number of Visits 16    SLP Start Time 0845    SLP Stop Time  0926    SLP Time Calculation (min) 41 min    Activity Tolerance Patient tolerated treatment well             Past Medical History:  Diagnosis Date   Memory loss     Past Surgical History:  Procedure Laterality Date   CATARACT EXTRACTION Bilateral    HEMORRHOID BANDING  2016    There were no vitals filed for this visit.   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 No/denies                   ADULT SLP TREATMENT - 07/24/21 0839       General Information   Behavior/Cognition Alert;Pleasant mood;Cooperative;Confused;Requires cueing      Treatment Provided   Treatment provided Cognitive-Linquistic      Cognitive-Linquistic Treatment   Treatment focused on Cognition;Aphasia;Patient/family/caregiver education    Skilled Treatment Pt able to recall going to auction this weekend with consistent anomia of key words regarding topic. SLP generated task for patient to write sentences/story with targeted key words to aid word finding and foster connections to those words as part of HEP. Pt required SLP A to generate response to explain scenario. Pt became fixated on written  portion of task, in which usual SLP modifications (closing notebook, requesting eye contact) and max prompting required to generate possible solutions to scenario. Wife endorsed this is usually occuring at home requiring max A to redirect attention. SLP provided education and recommendations to aid attention at home.      Assessment / Recommendations / Plan   Plan Continue with current plan of care      Progression Toward Goals   Progression toward goals Progressing toward goals              SLP Education - 07/24/21 0917     Education Details HEP    Person(s) Educated Patient;Spouse    Methods Explanation;Demonstration;Handout;Verbal cues    Comprehension Verbalized understanding;Returned demonstration;Need further instruction;Verbal cues required              SLP Short Term Goals - 07/24/21 0840       SLP SHORT TERM GOAL #1   Title Pt will utilize external memory/communication aids to facilitate naming and recall of personally relevant information given usual mod A over 2 sessions    Time 1    Period Weeks    Status On-going      SLP SHORT TERM GOAL #2   Title Pt will identify and correct phonemic paraphasias/articulation errors with usual mod A for 2/5 opportunities on  structured speech tasks over 2 sessions    Baseline 07-07-21, 07-17-21    Status Achieved      SLP SHORT TERM GOAL #3   Title Caregiver will utilize auditory processing/comprehension strategies to aid attention and comprehension in simple conversation given occasional min A over 2 sessions    Baseline 07-07-21, 07-21-21    Status Achieved      SLP SHORT TERM GOAL #4   Title Pt will utilize word finding compensations in 5 minute simple conversation with usual mod A  over 2 sessions    Baseline 07-13-21    Time 1    Period Weeks    Status On-going      SLP SHORT TERM GOAL #5   Title Caregivers will modify work environment (as needed) to compensate for cognitive communication deficits with occasional min A  over 2 sessions    Time 1    Period Weeks    Status On-going              SLP Long Term Goals - 07/24/21 0840       SLP LONG TERM GOAL #1   Title Pt will utilize external memory/communication aids to facilitate naming and recall of personally relevant information given occasional min A over 2 sessions    Time 5    Period Weeks    Status On-going      SLP LONG TERM GOAL #2   Title Pt will identify and correct phonemic paraphasias/articulation errors with usual mod A for 6/8 opportunities over 2 sessions    Time 9    Period Weeks    Status On-going      SLP LONG TERM GOAL #3   Title Caregiver will utilize auditory processing/comprehension strategies to aid attention and comprehension in simple conversation given rare min A over 2 sessions    Time 9    Period Weeks    Status On-going      SLP LONG TERM GOAL #4   Title Pt will utilize word finding compensations in 10 minute simple conversation with occasional mod A over 2 sessions    Time 9    Period Weeks    Status On-going      SLP LONG TERM GOAL #5   Title Pt will successfully continue work at [employer] with family support and external communication/memory aids to optimize communication effectiveness by last ST session    Time 9    Period Weeks    Status On-going              Plan - 07/24/21 0840     Clinical Impression Statement "[name]" was referred for OPST intervention to address aphasia and memory deficits related to dementia. SLP targeted and instructed strategies to aid word finding for anomia of high frequency, personally relevant terms. Usual cues required to aid attention this session as pt fixated on visual components which impacted verbal expression. Cued slow rate has been beneficial to optimize processing speed, word finding ability, and pt independent ability to self correct errors. Due to decline in expressive aphasia, memory, and attention, pt would benefit from additional ST services to  optimize current cognitive communciation for improved communication effectiveness and to increase QOL.    Speech Therapy Frequency 2x / week    Duration 12 weeks    Treatment/Interventions Compensatory strategies;Cueing hierarchy;Functional tasks;Patient/family education;Cognitive reorganization;Multimodal communcation approach;Language facilitation;Compensatory techniques;Internal/external aids;SLP instruction and feedback    Potential to Achieve Goals Fair    Potential Considerations Ability to  learn/carryover information;Previous level of function;Severity of impairments;Medical prognosis    SLP Home Exercise Plan provided    Consulted and Agree with Plan of Care Patient;Family member/caregiver             Patient will benefit from skilled therapeutic intervention in order to improve the following deficits and impairments:   Cognitive communication deficit  Aphasia    Problem List Patient Active Problem List   Diagnosis Date Noted   Obstructive sleep apnea treated with continuous positive airway pressure (CPAP) 01/08/2017   Medication management 07/30/2016   Slow urinary stream 03/21/2016   Hyperlipidemia 06/22/2014   Mild HTN 01/16/2013   HYPERLIPIDEMIA 02/17/2008   HEMORRHOIDS-INTERNAL 02/17/2008   GERD 02/12/2008    Alinda Deem, CCC-SLP 07/24/2021, 9:31 AM  [facility] [address] [place], [place], [address] Phone: [phone]   Fax:  [phone]   Name: Carlos Pierce MRN: [id number] Date of Birth: 1941-02-26

## 2021-07-24 NOTE — Patient Instructions (Addendum)
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

## 2021-07-27 ENCOUNTER — Other Ambulatory Visit: Payer: Self-pay

## 2021-07-27 ENCOUNTER — Ambulatory Visit: Payer: Medicare HMO

## 2021-07-27 DIAGNOSIS — R41841 Cognitive communication deficit: Secondary | ICD-10-CM

## 2021-07-27 DIAGNOSIS — R4701 Aphasia: Secondary | ICD-10-CM

## 2021-07-27 NOTE — Patient Instructions (Signed)
For homework, write 2-3 sentences about your dinner at [employer] and write 2-3 sentences about work.       _______________________________ at [employer].  [place] is _______________________.

## 2021-07-28 NOTE — Therapy (Signed)
[facility] [address] [place], [place], [address] Phone: [phone]   Fax:  [phone]  Speech Language Pathology Treatment  Patient Details  Name: Carlos Pierce MRN: [id number] Date of Birth: 06/30/1940 Referring Provider (SLP): Nickola Major, MD   Encounter Date: 07/27/2021   End of Session - 07/27/21 1607     Visit Number 9    Number of Visits 25    Date for SLP Re-Evaluation 09/15/21    Authorization Type [employer]    Authorization Time Period requested auth for 16 visits    Authorization - Visit Number 8    Authorization - Number of Visits 16    SLP Start Time 8315    SLP Stop Time  1700    SLP Time Calculation (min) 50 min    Activity Tolerance Patient tolerated treatment well             Past Medical History:  Diagnosis Date   Memory loss     Past Surgical History:  Procedure Laterality Date   CATARACT EXTRACTION Bilateral    HEMORRHOID BANDING  2016    There were no vitals filed for this visit.   Subjective Assessment - 07/27/21 1608     Subjective "good"    Currently in Pain? No/denies                   ADULT SLP TREATMENT - 07/27/21 1607       General Information   Behavior/Cognition Alert;Pleasant mood;Cooperative;Confused;Requires cueing      Treatment Provided   Treatment provided Cognitive-Linquistic      Cognitive-Linquistic Treatment   Treatment focused on Cognition;Aphasia;Patient/family/caregiver education    Skilled Treatment Pt completed one written paragraph related to personal topic (auction). Wife reported she provided max A to generate sentences using targeted words despite prior discussion. SLP trialed use of Wh- questions to aid generation of ideas re: topic of work. Wife provided prior communication support to aid patient recall, which was effective. Mod to max A required to answer simple questions related to work given structured  framework. SLP scaffolded tasks to include fill-in the blank sentences with mod to max A required to complete sentence despite max verbal and written cues present.      Assessment / Recommendations / Plan   Plan Continue with current plan of care      Progression Toward Goals   Progression toward goals Progressing toward goals              SLP Education - 07/28/21 0804     Education Details caregiver techniques to aid word finding and thought organization    Person(s) Educated Patient;Spouse    Methods Explanation;Demonstration;Verbal cues;Handout    Comprehension Verbalized understanding;Returned demonstration;Verbal cues required;Need further instruction              SLP Short Term Goals - 07/27/21 1608       SLP SHORT TERM GOAL #1   Title Pt will utilize external memory/communication aids to facilitate naming and recall of personally relevant information given usual mod A over 2 sessions    Baseline 07-28-21    Time 1    Period Weeks    Status On-going      SLP SHORT TERM GOAL #2   Title Pt will identify and correct phonemic paraphasias/articulation errors with usual mod A for 2/5 opportunities on structured speech tasks over 2 sessions    Baseline 07-07-21, 07-17-21    Status  Achieved      SLP SHORT TERM GOAL #3   Title Caregiver will utilize auditory processing/comprehension strategies to aid attention and comprehension in simple conversation given occasional min A over 2 sessions    Baseline 07-07-21, 07-21-21    Status Achieved      SLP SHORT TERM GOAL #4   Title Pt will utilize word finding compensations in 5 minute simple conversation with usual mod A  over 2 sessions    Baseline 07-13-21    Time 1    Period Weeks    Status On-going      SLP SHORT TERM GOAL #5   Title Caregivers will modify work environment (as needed) to compensate for cognitive communication deficits with occasional min A over 2 sessions    Time 1    Period Weeks    Status On-going               SLP Long Term Goals - 07/27/21 Emily #1   Title Pt will utilize external memory/communication aids to facilitate naming and recall of personally relevant information given occasional min A over 2 sessions    Time 5    Period Weeks    Status On-going      SLP LONG TERM GOAL #2   Title Pt will identify and correct phonemic paraphasias/articulation errors with usual mod A for 6/8 opportunities over 2 sessions    Time 9    Period Weeks    Status On-going      SLP LONG TERM GOAL #3   Title Caregiver will utilize auditory processing/comprehension strategies to aid attention and comprehension in simple conversation given rare min A over 2 sessions    Time 9    Period Weeks    Status On-going      SLP LONG TERM GOAL #4   Title Pt will utilize word finding compensations in 10 minute simple conversation with occasional mod A over 2 sessions    Time 9    Period Weeks    Status On-going      SLP LONG TERM GOAL #5   Title Pt will successfully continue work at [employer] with family support and external communication/memory aids to optimize communication effectiveness by last ST session    Time 9    Period Weeks    Status On-going              Plan - 07/27/21 1608     Clinical Impression Statement "Carlos Pierce" was referred for OPST intervention to address aphasia and memory deficits related to dementia. SLP targeted and instructed strategies to aid anomia for written descriptions of favorite items/places. Usual cues required to generate sentences using targeted words and occasional cues to aid attention this session as pt fixated on visual components which impacted verbal expression. Cued slow rate has been beneficial to optimize processing speed, word finding ability, and pt independent ability to self correct errors. Due to decline in expressive aphasia, memory, and attention, pt would benefit from additional ST services to optimize current  cognitive communciation for improved communication effectiveness and to increase QOL.    Speech Therapy Frequency 2x / week    Duration 12 weeks    Treatment/Interventions Compensatory strategies;Cueing hierarchy;Functional tasks;Patient/family education;Cognitive reorganization;Multimodal communcation approach;Language facilitation;Compensatory techniques;Internal/external aids;SLP instruction and feedback    Potential to Achieve Goals Fair    Potential Considerations Ability to learn/carryover information;Previous level of function;Severity of impairments;Medical prognosis  SLP Home Exercise Plan provided    Consulted and Agree with Plan of Care Patient;Family member/caregiver             Patient will benefit from skilled therapeutic intervention in order to improve the following deficits and impairments:   Cognitive communication deficit  Aphasia    Problem List Patient Active Problem List   Diagnosis Date Noted   Obstructive sleep apnea treated with continuous positive airway pressure (CPAP) 01/08/2017   Medication management 07/30/2016   Slow urinary stream 03/21/2016   Hyperlipidemia 06/22/2014   Mild HTN 01/16/2013   HYPERLIPIDEMIA 02/17/2008   HEMORRHOIDS-INTERNAL 02/17/2008   GERD 02/12/2008    Alinda Deem, CCC-SLP 07/28/2021, 8:07 AM  [facility] [address] [place], [place], [address] Phone: [phone]   Fax:  [phone]   Name: ALEXANDERJAMES BERG MRN: [id number] Date of Birth: [birth year]

## 2021-07-31 ENCOUNTER — Ambulatory Visit: Payer: Medicare HMO

## 2021-07-31 ENCOUNTER — Other Ambulatory Visit: Payer: Self-pay

## 2021-07-31 DIAGNOSIS — R4701 Aphasia: Secondary | ICD-10-CM

## 2021-07-31 DIAGNOSIS — R41841 Cognitive communication deficit: Secondary | ICD-10-CM

## 2021-07-31 NOTE — Patient Instructions (Signed)
Practice writing sentences/short stories with these keywords:   Automobile collectibles    Your house in [place]

## 2021-07-31 NOTE — Therapy (Signed)
[facility] [address] [place], [place], [address] Phone: [phone]   Fax:  [phone]  Speech Language Pathology Treatment/Progress Note  Patient Details  Name: Carlos Pierce MRN: [id number] Date of Birth: 12/28/1940 Referring Provider (SLP): Nickola Major, MD   Encounter Date: 07/31/2021   End of Session - 07/31/21 0840     Visit Number 10    Number of Visits 25    Date for SLP Re-Evaluation 09/15/21    Authorization Type [employer]    Authorization Time Period requested auth for 16 visits    Authorization - Visit Number 9    Authorization - Number of Visits 16    SLP Start Time 0845    SLP Stop Time  0930    SLP Time Calculation (min) 45 min    Activity Tolerance Patient tolerated treatment well             Past Medical History:  Diagnosis Date   Memory loss     Past Surgical History:  Procedure Laterality Date   CATARACT EXTRACTION Bilateral    HEMORRHOID BANDING  2016    There were no vitals filed for this visit.   Subjective Assessment - 07/31/21 0846     Subjective "I laid in bed all day"    Patient is accompained by: Family member    Currently in Pain? No/denies              Speech Therapy Progress Note  Dates of Reporting Period: 06-23-21 to current  Objective Reports of Subjective Statement: Pt has been seen for 10 ST visits targeting aphasia and cognitive linguistic skills secondary to dementia.   Objective Measurements: Pt exhibits mild improvements in ability to optimize independent word finding and self-corrections of dysnomia given intermittently cued slow rate, compensatory strategies, and extra processing time. Attention benefits from reduction of external visual stimuli to aid processing of verbal communication. For tasks of increased complexity (generating own ideas/thoughts), pt requires mod to max A from SLP and/or wife to complete due to  combination of cognitive and language deficits.   Goal Update: see goals below   Plan: continue per POC  Reason Skilled Services are Required: pt would continue to benefit from skilled ST intervention to optimize maintenance and perseveration of current cognitive linguistic skills     ADULT SLP TREATMENT - 07/31/21 0839       General Information   Behavior/Cognition Alert;Pleasant mood;Cooperative;Confused;Requires cueing      Treatment Provided   Treatment provided Cognitive-Linquistic      Cognitive-Linquistic Treatment   Treatment focused on Cognition;Aphasia;Patient/family/caregiver education    Skilled Treatment Pt returned with HEP completed to generate short functional short/paragraphs related to relevant topics to eventually serve as communication support. Occasional cues required to slow rate to aid word finding during oral reading. Pt benefits from structured conversation and usual prompting to generate ideas prior to writing. Adequate recall of SLP dictation exhibited in recall for shory writing.      Assessment / Recommendations / Plan   Plan Continue with current plan of care      Progression Toward Goals   Progression toward goals Progressing toward goals              SLP Education - 07/31/21 1028     Education Details HEP    Person(s) Educated Patient;Spouse    Methods Explanation;Demonstration;Verbal cues;Handout    Comprehension Verbalized understanding;Returned demonstration;Verbal cues required;Need further instruction  SLP Short Term Goals - 07/31/21 0840       SLP SHORT TERM GOAL #1   Title Pt will utilize external memory/communication aids to facilitate naming and recall of personally relevant information given usual mod A over 2 sessions    Baseline 07-28-21    Status Partially Met      SLP SHORT TERM GOAL #2   Title Pt will identify and correct phonemic paraphasias/articulation errors with usual mod A for 2/5 opportunities on  structured speech tasks over 2 sessions    Baseline 07-07-21, 07-17-21    Status Achieved      SLP SHORT TERM GOAL #3   Title Caregiver will utilize auditory processing/comprehension strategies to aid attention and comprehension in simple conversation given occasional min A over 2 sessions    Baseline 07-07-21, 07-21-21    Status Achieved      SLP SHORT TERM GOAL #4   Title Pt will utilize word finding compensations in 5 minute simple conversation with usual mod A  over 2 sessions    Baseline 07-13-21    Status Partially Met      SLP SHORT TERM GOAL #5   Title Caregivers will modify work environment (as needed) to compensate for cognitive communication deficits with occasional min A over 2 sessions    Status Deferred              SLP Long Term Goals - 07/31/21 0840       SLP LONG TERM GOAL #1   Title Pt will utilize external memory/communication aids to facilitate naming and recall of personally relevant information given occasional min A over 2 sessions    Time 4    Period Weeks    Status On-going      SLP LONG TERM GOAL #2   Title Pt will identify and correct phonemic paraphasias/articulation errors with usual mod A for 6/8 opportunities over 2 sessions    Time 8    Period Weeks    Status On-going      SLP LONG TERM GOAL #3   Title Caregiver will utilize auditory processing/comprehension strategies to aid attention and comprehension in simple conversation given rare min A over 2 sessions    Time 8    Period Weeks    Status On-going      SLP LONG TERM GOAL #4   Title Pt will utilize word finding compensations in 10 minute simple conversation with occasional mod A over 2 sessions    Time 8    Period Weeks    Status On-going      SLP LONG TERM GOAL #5   Title Pt will successfully continue work at [employer] with family support and external communication/memory aids to optimize communication effectiveness by last ST session    Time 8    Period Weeks    Status  On-going              Plan - 07/31/21 0840     Clinical Impression Statement "[name]" was referred for OPST intervention to address aphasia and memory deficits related to dementia. SLP targeted and instructed strategies to aid anomia for written descriptions of favorite items/places to serve as current/future communication supports. Usual cues required to generate sentences using targeted words and occasional cues to aid attention this session as pt fixated on visual components which impacted verbal expression. Cued slow rate has been beneficial to optimize processing speed, word finding ability, and pt independent ability to self correct errors. Due  to decline in expressive aphasia, memory, and attention, pt would benefit from additional ST services to optimize current cognitive communciation for improved communication effectiveness and to increase QOL.    Speech Therapy Frequency 2x / week    Duration 12 weeks    Treatment/Interventions Compensatory strategies;Cueing hierarchy;Functional tasks;Patient/family education;Cognitive reorganization;Multimodal communcation approach;Language facilitation;Compensatory techniques;Internal/external aids;SLP instruction and feedback    Potential to Achieve Goals Fair    Potential Considerations Ability to learn/carryover information;Previous level of function;Severity of impairments;Medical prognosis    SLP Home Exercise Plan provided    Consulted and Agree with Plan of Care Patient;Family member/caregiver             Patient will benefit from skilled therapeutic intervention in order to improve the following deficits and impairments:   Cognitive communication deficit  Aphasia    Problem List Patient Active Problem List   Diagnosis Date Noted   Obstructive sleep apnea treated with continuous positive airway pressure (CPAP) 01/08/2017   Medication management 07/30/2016   Slow urinary stream 03/21/2016   Hyperlipidemia 06/22/2014   Mild HTN  01/16/2013   HYPERLIPIDEMIA 02/17/2008   HEMORRHOIDS-INTERNAL 02/17/2008   GERD 02/12/2008    Marzetta Board, CCC-SLP 07/31/2021, 10:29 AM  [facility] [address] [place], [place], [address] Phone: [phone]   Fax:  [phone]   Name: Carlos Pierce MRN: [id number] Date of Birth: 11-12-1940

## 2021-08-03 ENCOUNTER — Other Ambulatory Visit: Payer: Self-pay

## 2021-08-03 ENCOUNTER — Ambulatory Visit: Payer: Medicare HMO

## 2021-08-03 DIAGNOSIS — R41841 Cognitive communication deficit: Secondary | ICD-10-CM | POA: Diagnosis not present

## 2021-08-03 DIAGNOSIS — R4701 Aphasia: Secondary | ICD-10-CM

## 2021-08-03 NOTE — Therapy (Signed)
[facility] [address] [place], [place], [address] Phone: [phone]   Fax:  [phone]  Speech Language Pathology Treatment  Patient Details  Name: Carlos Pierce MRN: [id number] Date of Birth: 1941/01/10 Referring Provider (SLP): Nickola Major, MD   Encounter Date: 08/03/2021   End of Session - 08/03/21 1354     Visit Number 11    Number of Visits 25    Date for SLP Re-Evaluation 09/15/21    Authorization Type [employer]    Authorization Time Period requested auth for 16 visits    Authorization - Visit Number 10    Authorization - Number of Visits 16    SLP Start Time 4259    SLP Stop Time  1440    SLP Time Calculation (min) 45 min    Activity Tolerance Patient tolerated treatment well             Past Medical History:  Diagnosis Date   Memory loss     Past Surgical History:  Procedure Laterality Date   CATARACT EXTRACTION Bilateral    HEMORRHOID BANDING  2016    There were no vitals filed for this visit.   Subjective Assessment - 08/03/21 1355     Subjective "I stayed in bed"    Patient is accompained by: Family member    Currently in Pain? No/denies                   ADULT SLP TREATMENT - 08/03/21 1354       General Information   Behavior/Cognition Alert;Pleasant mood;Cooperative;Confused;Requires cueing      Treatment Provided   Treatment provided Cognitive-Linquistic      Cognitive-Linquistic Treatment   Treatment focused on Cognition;Aphasia;Patient/family/caregiver education    Skilled Treatment Pt returned with written assignment. Wife typed, which aided reading accuracy due to reduced legibility of handwriting. Pt able to self-correct some errors while reading. SLP recommended increased use of visual aids to support communication and recall. SLP will assist with updating communication supports with visual aids as provided. Pt able to self-correct  dysnomia/anomia x5 in conversation with extra time. Intermittent cues required to slow rate to aid processing and word finding in conversation. Pt continues to greet and talk with customers at work, which has been benefical for patient.      Assessment / Recommendations / Plan   Plan Continue with current plan of care      Progression Toward Goals   Progression toward goals Progressing toward goals              SLP Education - 08/03/21 1443     Education Details HEP, recommendations    Person(s) Educated Patient;Spouse    Methods Explanation;Demonstration;Handout    Comprehension Verbalized understanding;Returned demonstration;Need further instruction              SLP Short Term Goals - 07/31/21 0840       SLP SHORT TERM GOAL #1   Title Pt will utilize external memory/communication aids to facilitate naming and recall of personally relevant information given usual mod A over 2 sessions    Baseline 07-28-21    Status Partially Met      SLP SHORT TERM GOAL #2   Title Pt will identify and correct phonemic paraphasias/articulation errors with usual mod A for 2/5 opportunities on structured speech tasks over 2 sessions    Baseline 07-07-21, 07-17-21    Status Achieved      SLP SHORT TERM  GOAL #3   Title Caregiver will utilize auditory processing/comprehension strategies to aid attention and comprehension in simple conversation given occasional min A over 2 sessions    Baseline 07-07-21, 07-21-21    Status Achieved      SLP SHORT TERM GOAL #4   Title Pt will utilize word finding compensations in 5 minute simple conversation with usual mod A  over 2 sessions    Baseline 07-13-21    Status Partially Met      SLP SHORT TERM GOAL #5   Title Caregivers will modify work environment (as needed) to compensate for cognitive communication deficits with occasional min A over 2 sessions    Status Deferred              SLP Long Term Goals - 08/03/21 [place]  #1   Title Pt will utilize external memory/communication aids to facilitate naming and recall of personally relevant information given occasional min A over 2 sessions    Baseline 08-03-21    Time 4    Period Weeks    Status On-going      SLP LONG TERM GOAL #2   Title Pt will identify and correct phonemic paraphasias/articulation errors with usual mod A for 6/8 opportunities over 2 sessions    Baseline 08-03-21    Time 8    Period Weeks    Status On-going      SLP LONG TERM GOAL #3   Title Caregiver will utilize auditory processing/comprehension strategies to aid attention and comprehension in simple conversation given rare min A over 2 sessions    Time 8    Period Weeks    Status On-going      SLP LONG TERM GOAL #4   Title Pt will utilize word finding compensations in 10 minute simple conversation with occasional mod A over 2 sessions    Time 8    Period Weeks    Status On-going      SLP LONG TERM GOAL #5   Title Pt will successfully continue work at [employer] with family support and external communication/memory aids to optimize communication effectiveness by last ST session    Time 8    Period Weeks    Status On-going              Plan - 08/03/21 1355     Clinical Impression Statement "Carlos Pierce" was referred for OPST intervention to address aphasia and memory deficits related to dementia. SLP targeted and instructed strategies to aid anomia for written descriptions of favorite items/places to serve as current/future communication supports. Usual cues required to generate sentences using targeted words and occasional cues to aid attention this session as pt fixated on visual components which impacted verbal expression. Cued slow rate has been beneficial to optimize processing speed, word finding ability, and pt independent ability to self correct errors. Due to decline in expressive aphasia, memory, and attention, pt would benefit from additional ST services to optimize  current cognitive communciation for improved communication effectiveness and to increase QOL.    Speech Therapy Frequency 2x / week    Duration 12 weeks    Treatment/Interventions Compensatory strategies;Cueing hierarchy;Functional tasks;Patient/family education;Cognitive reorganization;Multimodal communcation approach;Language facilitation;Compensatory techniques;Internal/external aids;SLP instruction and feedback    Potential to Achieve Goals Fair    Potential Considerations Ability to learn/carryover information;Previous level of function;Severity of impairments;Medical prognosis    SLP Home Exercise Plan provided    Consulted and Agree  with Plan of Care Patient;Family member/caregiver             Patient will benefit from skilled therapeutic intervention in order to improve the following deficits and impairments:   Cognitive communication deficit  Aphasia    Problem List Patient Active Problem List   Diagnosis Date Noted   Obstructive sleep apnea treated with continuous positive airway pressure (CPAP) 01/08/2017   Medication management 07/30/2016   Slow urinary stream 03/21/2016   Hyperlipidemia 06/22/2014   Mild HTN 01/16/2013   HYPERLIPIDEMIA 02/17/2008   HEMORRHOIDS-INTERNAL 02/17/2008   GERD 02/12/2008    Marzetta Board, CCC-SLP 08/03/2021, 2:44 PM  [facility] [address] [place], [place], [address] Phone: [phone]   Fax:  [phone]   Name: Carlos Pierce MRN: [id number] Date of Birth: [birth year]

## 2021-08-07 ENCOUNTER — Ambulatory Visit: Payer: Medicare HMO

## 2021-08-07 ENCOUNTER — Other Ambulatory Visit: Payer: Self-pay

## 2021-08-07 DIAGNOSIS — R41841 Cognitive communication deficit: Secondary | ICD-10-CM | POA: Diagnosis not present

## 2021-08-07 DIAGNOSIS — R4701 Aphasia: Secondary | ICD-10-CM

## 2021-08-07 NOTE — Therapy (Signed)
[facility] [address] [place], [place], [address] Phone: [phone]   Fax:  [phone]  Speech Language Pathology Treatment  Patient Details  Name: Carlos Pierce MRN: [id number] Date of Birth: [birth year] Referring Provider (SLP): Nickola Major, MD   Encounter Date: 08/07/2021   End of Session - 08/07/21 0839     Visit Number 12    Number of Visits 25    Date for SLP Re-Evaluation 09/15/21    Authorization Type [employer]    Authorization Time Period requested auth for 16 visits    Authorization - Visit Number 11    Authorization - Number of Visits 16    SLP Start Time 0848    SLP Stop Time  0928    SLP Time Calculation (min) 40 min    Activity Tolerance Patient tolerated treatment well             Past Medical History:  Diagnosis Date   Memory loss     Past Surgical History:  Procedure Laterality Date   CATARACT EXTRACTION Bilateral    HEMORRHOID BANDING  2016    There were no vitals filed for this visit.   Subjective Assessment - 08/07/21 0848     Subjective "I'm awake"    Patient is accompained by: Family member    Currently in Pain? No/denies                   ADULT SLP TREATMENT - 08/07/21 0838       General Information   Behavior/Cognition Alert;Pleasant mood;Cooperative;Confused;Requires cueing      Treatment Provided   Treatment provided Cognitive-Linquistic      Cognitive-Linquistic Treatment   Treatment focused on Cognition;Aphasia;Patient/family/caregiver education    Skilled Treatment Pt returned with written hwk, including pictures to support cognitive communication. Intermittent mid to mod A required to aid anomia in structured conversation. Pt benefited from cued slow rate, reduction of internal pressure via cued breathing breaks, and establishment of eye contact to aid attention. Pt continues to discuss personally relevant topics with success  given intermittent A.      Assessment / Recommendations / Plan   Plan Continue with current plan of care      Progression Toward Goals   Progression toward goals Progressing toward goals              SLP Education - 08/07/21 0929     Education Details HEP, strategies to support cog communication    Person(s) Educated Patient;Spouse    Methods Explanation;Demonstration;Verbal cues    Comprehension Verbalized understanding;Returned demonstration;Need further instruction              SLP Short Term Goals - 07/31/21 0840       SLP SHORT TERM GOAL #1   Title Pt will utilize external memory/communication aids to facilitate naming and recall of personally relevant information given usual mod A over 2 sessions    Baseline 07-28-21    Status Partially Met      SLP SHORT TERM GOAL #2   Title Pt will identify and correct phonemic paraphasias/articulation errors with usual mod A for 2/5 opportunities on structured speech tasks over 2 sessions    Baseline 07-07-21, 07-17-21    Status Achieved      SLP SHORT TERM GOAL #3   Title Caregiver will utilize auditory processing/comprehension strategies to aid attention and comprehension in simple conversation given occasional min A over 2 sessions    Baseline  07-07-21, 07-21-21    Status Achieved      SLP SHORT TERM GOAL #4   Title Pt will utilize word finding compensations in 5 minute simple conversation with usual mod A  over 2 sessions    Baseline 07-13-21    Status Partially Met      SLP SHORT TERM GOAL #5   Title Caregivers will modify work environment (as needed) to compensate for cognitive communication deficits with occasional min A over 2 sessions    Status Deferred              SLP Long Term Goals - 08/07/21 0839       SLP LONG TERM GOAL #1   Title Pt will utilize external memory/communication aids to facilitate naming and recall of personally relevant information given occasional min A over 2 sessions    Baseline  08-03-21, 08-07-21    Time 3    Period Weeks    Status Achieved      SLP LONG TERM GOAL #2   Title Pt will identify and correct phonemic paraphasias/articulation errors with usual mod A for 6/8 opportunities over 2 sessions    Baseline 08-03-21    Time 7    Period Weeks    Status On-going      SLP LONG TERM GOAL #3   Title Caregiver will utilize auditory processing/comprehension strategies to aid attention and comprehension in simple conversation given rare min A over 2 sessions    Baseline 08-07-21    Time 7    Period Weeks    Status On-going      SLP LONG TERM GOAL #4   Title Pt will utilize word finding compensations in 10 minute simple conversation with occasional mod A over 2 sessions    Time 7    Period Weeks    Status On-going      SLP LONG TERM GOAL #5   Title Pt will successfully continue work at [employer] with family support and external communication/memory aids to optimize communication effectiveness by last ST session    Time 7    Period Weeks    Status On-going              Plan - 08/07/21 0839     Clinical Impression Statement "Carlos Pierce" was referred for OPST intervention to address aphasia and memory deficits related to dementia. SLP targeted and instructed strategies to aid anomia for written and verbal descriptions of favorite items/places to serve as current/future communication supports. Visual aids were successful to support current cognitive communication in conversation today. Cued slow rate has been beneficial to optimize processing speed, word finding ability, and pt independent ability to self correct errors. Due to decline in expressive aphasia, memory, and attention, pt would benefit from additional ST services to optimize current cognitive communciation for improved communication effectiveness and to increase QOL.    Speech Therapy Frequency 2x / week    Duration 12 weeks    Treatment/Interventions Compensatory strategies;Cueing  hierarchy;Functional tasks;Patient/family education;Cognitive reorganization;Multimodal communcation approach;Language facilitation;Compensatory techniques;Internal/external aids;SLP instruction and feedback    Potential to Achieve Goals Fair    Potential Considerations Ability to learn/carryover information;Previous level of function;Severity of impairments;Medical prognosis    SLP Home Exercise Plan provided    Consulted and Agree with Plan of Care Patient;Family member/caregiver             Patient will benefit from skilled therapeutic intervention in order to improve the following deficits and impairments:   Cognitive  communication deficit  Aphasia    Problem List Patient Active Problem List   Diagnosis Date Noted   Obstructive sleep apnea treated with continuous positive airway pressure (CPAP) 01/08/2017   Medication management 07/30/2016   Slow urinary stream 03/21/2016   Hyperlipidemia 06/22/2014   Mild HTN 01/16/2013   HYPERLIPIDEMIA 02/17/2008   HEMORRHOIDS-INTERNAL 02/17/2008   GERD 02/12/2008    Marzetta Board, CCC-SLP 08/07/2021, 11:37 AM  [facility] [address] [place], [place], [address] Phone: [phone]   Fax:  [phone]   Name: Carlos Pierce MRN: [id number] Date of Birth: [birth year]

## 2021-08-10 ENCOUNTER — Other Ambulatory Visit: Payer: Self-pay

## 2021-08-10 ENCOUNTER — Ambulatory Visit: Payer: Medicare HMO | Attending: Family Medicine

## 2021-08-10 DIAGNOSIS — R41841 Cognitive communication deficit: Secondary | ICD-10-CM | POA: Diagnosis present

## 2021-08-10 DIAGNOSIS — R4701 Aphasia: Secondary | ICD-10-CM | POA: Diagnosis present

## 2021-08-11 NOTE — Therapy (Signed)
[facility] ?[place], [place], [address] ?Phone: [phone]   Fax:  [phone] ? ?Speech Language Pathology Treatment ? ?Patient Details  ?Name: Carlos Pierce ?MRN: [id number] ?Date of Birth: 03/14/1941 ?Referring Provider (SLP): Nickola Major, MD ? ? ?Encounter Date: 08/10/2021 ? ? End of Session - 08/10/21 1608   ? ? Visit Number 13   ? Number of Visits 25   ? Date for SLP Re-Evaluation 09/15/21   ? Authorization Type [employer]   ? Authorization Time Period requested auth for 16 visits   ? Authorization - Visit Number 12   ? Authorization - Number of Visits 16   ? SLP Start Time [id number]   ? SLP Stop Time  1652   ? SLP Time Calculation (min) 44 min   ? Activity Tolerance Patient tolerated treatment well   ? ?  ?  ? ?  ? ? ?Past Medical History:  ?Diagnosis Date  ? Memory loss   ? ? ?Past Surgical History:  ?Procedure Laterality Date  ? CATARACT EXTRACTION Bilateral   ? HEMORRHOID BANDING  2016  ? ? ?There were no vitals filed for this visit. ? ? Subjective Assessment - 08/10/21 1609   ? ? Subjective "Nothing. I stayed home"   ? Patient is accompained by: Family member   ? Currently in Pain? No/denies   ? ?  ?  ? ?  ? ? ? ? ? ? ? ? ADULT SLP TREATMENT - 08/10/21 1607   ? ?  ? General Information  ? Behavior/Cognition Alert;Pleasant mood;Cooperative;Confused;Requires cueing   ?  ? Treatment Provided  ? Treatment provided Cognitive-Linquistic   ?  ? Cognitive-Linquistic Treatment  ? Treatment focused on Cognition;Aphasia;Patient/family/caregiver education   ? Skilled Treatment Recent increased reports of patient staying home from work. Wife, [name], also reported some concern for noticable decline in language and attention at home. SLP recommended f/u with MD and provided education re: potential expectations with progressive disease. SLP targeted use of created communication supports to assist patient in conversation, in which pt  able to benefit from supports with occasional fading to rare cues today. SLP provided additional education re: strategies to aid attention, recall, and naming at home.   ?  ? Assessment / Recommendations / Plan  ? Plan Continue with current plan of care   ?  ? Progression Toward Goals  ? Progression toward goals Progressing toward goals   ? ?  ?  ? ?  ? ? ? SLP Education - 08/11/21 0800   ? ? Education Details attention/memory/communication strategies, use communication supports   ? Person(s) Educated Patient;Spouse   ? Methods Explanation;Demonstration;Verbal cues;Handout   ? Comprehension Verbalized understanding;Returned demonstration;Verbal cues required;Need further instruction   ? ?  ?  ? ?  ? ? ? SLP Short Term Goals - 07/31/21 0840   ? ?  ? SLP SHORT TERM GOAL #1  ? Title Pt will utilize external memory/communication aids to facilitate naming and recall of personally relevant information given usual mod A over 2 sessions   ? Baseline 07-28-21   ? Status Partially Met   ?  ? SLP SHORT TERM GOAL #2  ? Title Pt will identify and correct phonemic paraphasias/articulation errors with usual mod A for 2/5 opportunities on structured speech tasks over 2 sessions   ? Baseline 07-07-21, 07-17-21   ? Status Achieved   ?  ? SLP SHORT TERM GOAL #3  ? Title  Caregiver will utilize auditory processing/comprehension strategies to aid attention and comprehension in simple conversation given occasional min A over 2 sessions   ? Baseline 07-07-21, 07-21-21   ? Status Achieved   ?  ? SLP SHORT TERM GOAL #4  ? Title Pt will utilize word finding compensations in 5 minute simple conversation with usual mod A  over 2 sessions   ? Baseline 07-13-21   ? Status Partially Met   ?  ? SLP SHORT TERM GOAL #5  ? Title Caregivers will modify work environment (as needed) to compensate for cognitive communication deficits with occasional min A over 2 sessions   ? Status Deferred   ? ?  ?  ? ?  ? ? ? SLP Long Term Goals - 08/10/21 1608   ? ?  ? SLP  LONG TERM GOAL #1  ? Title Pt will utilize external memory/communication aids to facilitate naming and recall of personally relevant information given occasional min A over 2 sessions   ? Baseline 08-03-21, 08-07-21   ? Status Achieved   ?  ? SLP LONG TERM GOAL #2  ? Title Pt will identify and correct phonemic paraphasias/articulation errors with usual mod A for 6/8 opportunities over 2 sessions   ? Baseline 08-03-21   ? Time 7   ? Period Weeks   ? Status On-going   ?  ? SLP LONG TERM GOAL #3  ? Title Caregiver will utilize auditory processing/comprehension strategies to aid attention and comprehension in simple conversation given rare min A over 2 sessions   ? Baseline 08-07-21   ? Time 7   ? Period Weeks   ? Status On-going   ?  ? SLP LONG TERM GOAL #4  ? Title Pt will utilize word finding compensations in 10 minute simple conversation with occasional mod A over 2 sessions   ? Time 7   ? Period Weeks   ? Status On-going   ?  ? SLP LONG TERM GOAL #5  ? Title Pt will successfully continue work at [employer] with family support and external communication/memory aids to optimize communication effectiveness by last ST session   ? Time 7   ? Period Weeks   ? Status On-going   ? ?  ?  ? ?  ? ? ? Plan - 08/10/21 1608   ? ? Clinical Impression Statement "[name]" was referred for OPST intervention to address aphasia and memory deficits related to dementia. SLP targeted and instructed use of communication supports to optimize verbal descriptions of favorite items/places. Visual aids were successful to support current cognitive communication in conversation today with occasional fading to rare cues. Cued slow rate has been beneficial to optimize processing speed, word finding ability, and pt independent ability to self correct some errors. Wife expressed concern for more noticable decline in language and attention at home, in which SLP provided education and recommendations to support patient given progressive nature of  dementia. Due to decline in expressive aphasia, memory, and attention, pt would continue to benefit from additional ST services to optimize current cognitive communciation for improved communication effectiveness and to increase QOL.   ? Speech Therapy Frequency 2x / week   ? Duration 12 weeks   ? Treatment/Interventions Compensatory strategies;Cueing hierarchy;Functional tasks;Patient/family education;Cognitive reorganization;Multimodal communcation approach;Language facilitation;Compensatory techniques;Internal/external aids;SLP instruction and feedback   ? Potential to [place]   ? Potential Considerations Ability to learn/carryover information;Previous level of function;Severity of impairments;Medical prognosis   ? SLP Home Exercise  Plan provided   ? Consulted and Agree with Plan of Care Patient;Family member/caregiver   ? ?  ?  ? ?  ? ? ?Patient will benefit from skilled therapeutic intervention in order to improve the following deficits and impairments:   ?Cognitive communication deficit ? ?Aphasia ? ? ? ?Problem List ?Patient Active Problem List  ? Diagnosis Date Noted  ? Obstructive sleep apnea treated with continuous positive airway pressure (CPAP) 01/08/2017  ? Medication management 07/30/2016  ? Slow urinary stream 03/21/2016  ? Hyperlipidemia 06/22/2014  ? Mild HTN 01/16/2013  ? HYPERLIPIDEMIA 02/17/2008  ? HEMORRHOIDS-INTERNAL 02/17/2008  ? GERD 02/12/2008  ? ? ?Marzetta Board, CCC-SLP ?08/11/2021, 8:02 AM ? ?[facility] ?[address] ?[place], [place], [address] ?Phone: [phone]   Fax:  [phone] ? ? ?Name: CORDNEY BARSTOW ?MRN: [id number] ?Date of Birth: 1940-09-28 ? ?

## 2021-08-14 ENCOUNTER — Ambulatory Visit: Payer: Medicare HMO

## 2021-08-14 ENCOUNTER — Other Ambulatory Visit: Payer: Self-pay

## 2021-08-14 DIAGNOSIS — R41841 Cognitive communication deficit: Secondary | ICD-10-CM | POA: Diagnosis not present

## 2021-08-14 DIAGNOSIS — R4701 Aphasia: Secondary | ICD-10-CM

## 2021-08-14 NOTE — Therapy (Signed)
[facility] ?[facility] ?[place], [place], [address] ?Phone: [phone]   Fax:  [phone] ? ?Speech Language Pathology Treatment ? ?Patient Details  ?Name: Carlos Pierce ?MRN: [id number] ?Date of Birth: 1941/05/16 ?Referring Provider (SLP): Nickola Major, MD ? ? ?Encounter Date: 08/14/2021 ? ? End of Session - 08/14/21 1761   ? ? Visit Number 14   ? Number of Visits 25   ? Date for SLP Re-Evaluation 09/15/21   ? Authorization Type [employer]   ? Authorization Time Period requested auth for 16 visits   ? Authorization - Visit Number 13   ? Authorization - Number of Visits 16   ? SLP Start Time 0845   ? SLP Stop Time  0930   ? SLP Time Calculation (min) 45 min   ? Activity Tolerance Patient tolerated treatment well   ? ?  ?  ? ?  ? ? ?Past Medical History:  ?Diagnosis Date  ? Memory loss   ? ? ?Past Surgical History:  ?Procedure Laterality Date  ? CATARACT EXTRACTION Bilateral   ? HEMORRHOID BANDING  2016  ? ? ?There were no vitals filed for this visit. ? ? Subjective Assessment - 08/14/21 0841   ? ? Subjective "Good"   ? Patient is accompained by: Family member   wife  ? Currently in Pain? No/denies   ? ?  ?  ? ?  ? ? ? ? ? ? ? ? ADULT SLP TREATMENT - 08/14/21 0832   ? ?  ? General Information  ? Behavior/Cognition Alert;Pleasant mood;Cooperative;Confused;Requires cueing   ?  ? Treatment Provided  ? Treatment provided Cognitive-Linquistic   ?  ? Cognitive-Linquistic Treatment  ? Treatment focused on Cognition;Aphasia;Patient/family/caregiver education   ? Skilled Treatment Pt required max prompting to verbally ID weekend events. Increased anomia exhibited this session, which required mod to max verbal and visual cues to correct. Intermittent mod A required to establish eye contact to aid attention. SLP educated and instructed use of repetition and rephrasing to aid patient immediate recall of auditory information. Recent cognitive  linguistic decline noted, in which SLP continues to educate and instruct use of communication supports and memory/attention strategies. Pt expected to f/u with neurologist in April.   ?  ? Assessment / Recommendations / Plan  ? Plan Continue with current plan of care   ?  ? Progression Toward Goals  ? Progression toward goals Progressing toward goals   ? ?  ?  ? ?  ? ? ? SLP Education - 08/14/21 1028   ? ? Education Details atttention/memory/communication strategies   ? Person(s) Educated Patient;Spouse   ? Methods Explanation;Demonstration;Verbal cues;Handout   ? Comprehension Verbalized understanding;Returned demonstration;Verbal cues required;Need further instruction   ? ?  ?  ? ?  ? ? ? SLP Short Term Goals - 07/31/21 0840   ? ?  ? SLP SHORT TERM GOAL #1  ? Title Pt will utilize external memory/communication aids to facilitate naming and recall of personally relevant information given usual mod A over 2 sessions   ? Baseline 07-28-21   ? Status Partially Met   ?  ? SLP SHORT TERM GOAL #2  ? Title Pt will identify and correct phonemic paraphasias/articulation errors with usual mod A for 2/5 opportunities on structured speech tasks over 2 sessions   ? Baseline 07-07-21, 07-17-21   ? Status Achieved   ?  ? SLP SHORT TERM GOAL #3  ? Title Caregiver will  utilize auditory processing/comprehension strategies to aid attention and comprehension in simple conversation given occasional min A over 2 sessions   ? Baseline 07-07-21, 07-21-21   ? Status Achieved   ?  ? SLP SHORT TERM GOAL #4  ? Title Pt will utilize word finding compensations in 5 minute simple conversation with usual mod A  over 2 sessions   ? Baseline 07-13-21   ? Status Partially Met   ?  ? SLP SHORT TERM GOAL #5  ? Title Caregivers will modify work environment (as needed) to compensate for cognitive communication deficits with occasional min A over 2 sessions   ? Status Deferred   ? ?  ?  ? ?  ? ? ? SLP Long Term Goals - 08/14/21 4944   ? ?  ? SLP LONG TERM GOAL  #1  ? Title Pt will utilize external memory/communication aids to facilitate naming and recall of personally relevant information given occasional min A over 2 sessions   ? Baseline 08-03-21, 08-07-21   ? Status Achieved   ?  ? SLP LONG TERM GOAL #2  ? Title Pt will identify and correct phonemic paraphasias/articulation errors with usual mod A for 6/8 opportunities over 2 sessions   ? Baseline 08-03-21   ? Time 6   ? Period Weeks   ? Status On-going   ?  ? SLP LONG TERM GOAL #3  ? Title Caregiver will utilize auditory processing/comprehension strategies to aid attention and comprehension in simple conversation given rare min A over 2 sessions   ? Baseline 08-07-21   ? Time 6   ? Period Weeks   ? Status On-going   ?  ? SLP LONG TERM GOAL #4  ? Title Pt will utilize word finding compensations in 10 minute simple conversation with occasional mod A over 2 sessions   ? Time 6   ? Period Weeks   ? Status On-going   ?  ? SLP LONG TERM GOAL #5  ? Title Pt will successfully continue work at [employer] with family support and external communication/memory aids to optimize communication effectiveness by last ST session   ? Time 6   ? Period Weeks   ? Status On-going   ? ?  ?  ? ?  ? ? ? Plan - 08/14/21 0833   ? ? Clinical Impression Statement "Carlos Pierce" was referred for OPST intervention to address aphasia and memory deficits related to dementia. SLP targeted and instructed use of communication supports to optimize verbal descriptions of favorite items/places. Visual aids were successful to support current cognitive communication in conversation today with occasional cues. Wife expressed concern for more noticable decline in language and attention at home, in which SLP provided education and recommendations to support patient given progressive nature of dementia. Due to decline in expressive aphasia, memory, and attention, pt would continue to benefit from additional ST services to optimize current cognitive communciation for  improved communication effectiveness and to increase QOL.   ? Speech Therapy Frequency 2x / week   ? Duration 12 weeks   ? Treatment/Interventions Compensatory strategies;Cueing hierarchy;Functional tasks;Patient/family education;Cognitive reorganization;Multimodal communcation approach;Language facilitation;Compensatory techniques;Internal/external aids;SLP instruction and feedback   ? Potential to [place]   ? Potential Considerations Ability to learn/carryover information;Previous level of function;Severity of impairments;Medical prognosis   ? SLP Home Exercise Plan provided   ? Consulted and Agree with Plan of Care Patient;Family member/caregiver   ? ?  ?  ? ?  ? ? ?Patient  will benefit from skilled therapeutic intervention in order to improve the following deficits and impairments:   ?Cognitive communication deficit ? ?Aphasia ? ? ? ?Problem List ?Patient Active Problem List  ? Diagnosis Date Noted  ? Obstructive sleep apnea treated with continuous positive airway pressure (CPAP) 01/08/2017  ? Medication management 07/30/2016  ? Slow urinary stream 03/21/2016  ? Hyperlipidemia 06/22/2014  ? Mild HTN 01/16/2013  ? HYPERLIPIDEMIA 02/17/2008  ? HEMORRHOIDS-INTERNAL 02/17/2008  ? GERD 02/12/2008  ? ? ?Marzetta Board, CCC-SLP ?08/14/2021, 10:29 AM ? ?[facility] ?[facility] [place] ?[place], [place], [address] ?Phone: [phone]   Fax:  [phone] ? ? ?Name: Carlos Pierce ?MRN: [id number] ?Date of Birth: 1940-08-05 ? ?

## 2021-08-17 ENCOUNTER — Ambulatory Visit: Payer: Medicare HMO

## 2021-08-17 ENCOUNTER — Other Ambulatory Visit: Payer: Self-pay

## 2021-08-17 DIAGNOSIS — R41841 Cognitive communication deficit: Secondary | ICD-10-CM | POA: Diagnosis not present

## 2021-08-17 DIAGNOSIS — R4701 Aphasia: Secondary | ICD-10-CM

## 2021-08-17 NOTE — Therapy (Signed)
[facility] ?[place] ?[place], [place], [address] ?Phone: [phone]   Fax:  [phone] ? ?Speech Language Pathology Treatment ? ?Patient Details  ?Name: Carlos Pierce ?MRN: [id number] ?Date of Birth: [birth year] ?Referring Provider (SLP): Nickola Major, MD ? ? ?Encounter Date: 08/17/2021 ? ? End of Session - 08/17/21 1519   ? ? Visit Number 15   ? Number of Visits 25   ? Date for SLP Re-Evaluation 09/15/21   ? Authorization Type [employer]   ? Authorization Time Period requested auth for 16 visits   ? Authorization - Visit Number 14   ? Authorization - Number of Visits 16   ? SLP Start Time 1530   ? SLP Stop Time  1615   ? SLP Time Calculation (min) 45 min   ? Activity Tolerance Patient tolerated treatment well   ? ?  ?  ? ?  ? ? ?Past Medical History:  ?Diagnosis Date  ? Memory loss   ? ? ?Past Surgical History:  ?Procedure Laterality Date  ? CATARACT EXTRACTION Bilateral   ? HEMORRHOID BANDING  2016  ? ? ?There were no vitals filed for this visit. ? ? Subjective Assessment - 08/17/21 1525   ? ? Subjective "slow"   ? Patient is accompained by: Family member   wife  ? Currently in Pain? No/denies   ? ?  ?  ? ?  ? ? ? ? ? ? ? ? ADULT SLP TREATMENT - 08/17/21 1519   ? ?  ? General Information  ? Behavior/Cognition Alert;Pleasant mood;Cooperative;Confused;Requires cueing   ?  ? Treatment Provided  ? Treatment provided Cognitive-Linquistic   ?  ? Cognitive-Linquistic Treatment  ? Treatment focused on Cognition;Aphasia;Patient/family/caregiver education   ? Skilled Treatment Pt filled out weekly schedule to aid recall and communication. Occasional cues mod cues required to aid reading accuracy. Pt presents with increased anomia, vague language, and circumlocution in conversation requiring increased SLP/wife assistance. Pt even exhibited increased difficulty with picture naming and simple descriptions requiring increased prompting and  reliance on visual aids. SLP plans to update communication supports as pt is becoming more reliant for verbal expression and memory.   ?  ? Assessment / Recommendations / Plan  ? Plan Continue with current plan of care   ?  ? Progression Toward Goals  ? Progression toward goals Progressing toward goals   slower progression  ? ?  ?  ? ?  ? ? ? SLP Education - 08/17/21 1618   ? ? Education Details need to update communication support, strategies   ? Person(s) Educated Patient;Spouse   ? Methods Explanation;Demonstration;Verbal cues   ? Comprehension Verbalized understanding;Returned demonstration;Verbal cues required;Need further instruction   ? ?  ?  ? ?  ? ? ? SLP Short Term Goals - 07/31/21 0840   ? ?  ? SLP SHORT TERM GOAL #1  ? Title Pt will utilize external memory/communication aids to facilitate naming and recall of personally relevant information given usual mod A over 2 sessions   ? Baseline 07-28-21   ? Status Partially Met   ?  ? SLP SHORT TERM GOAL #2  ? Title Pt will identify and correct phonemic paraphasias/articulation errors with usual mod A for 2/5 opportunities on structured speech tasks over 2 sessions   ? Baseline 07-07-21, 07-17-21   ? Status Achieved   ?  ? SLP SHORT TERM GOAL #3  ? Title Caregiver will utilize auditory processing/comprehension strategies  to aid attention and comprehension in simple conversation given occasional min A over 2 sessions   ? Baseline 07-07-21, 07-21-21   ? Status Achieved   ?  ? SLP SHORT TERM GOAL #4  ? Title Pt will utilize word finding compensations in 5 minute simple conversation with usual mod A  over 2 sessions   ? Baseline 07-13-21   ? Status Partially Met   ?  ? SLP SHORT TERM GOAL #5  ? Title Caregivers will modify work environment (as needed) to compensate for cognitive communication deficits with occasional min A over 2 sessions   ? Status Deferred   ? ?  ?  ? ?  ? ? ? SLP Long Term Goals - 08/17/21 1520   ? ?  ? SLP LONG TERM GOAL #1  ? Title Pt will utilize  external memory/communication aids to facilitate naming and recall of personally relevant information given occasional min A over 2 sessions   ? Baseline 08-03-21, 08-07-21   ? Status Achieved   ?  ? SLP LONG TERM GOAL #2  ? Title Pt will identify and correct phonemic paraphasias/articulation errors with usual mod A for 6/8 opportunities over 2 sessions   ? Baseline 08-03-21   ? Time 6   ? Period Weeks   ? Status On-going   ?  ? SLP LONG TERM GOAL #3  ? Title Caregiver will utilize auditory processing/comprehension strategies to aid attention and comprehension in simple conversation given rare min A over 2 sessions   ? Baseline 08-07-21   ? Time 6   ? Period Weeks   ? Status On-going   ?  ? SLP LONG TERM GOAL #4  ? Title Pt will utilize word finding compensations in 10 minute simple conversation with occasional mod A over 2 sessions   ? Time 6   ? Period Weeks   ? Status On-going   ?  ? SLP LONG TERM GOAL #5  ? Title Pt will successfully continue work at [employer] with family support and external communication/memory aids to optimize communication effectiveness by last ST session   ? Time 6   ? Period Weeks   ? Status On-going   ? ?  ?  ? ?  ? ? ? Plan - 08/17/21 1520   ? ? Clinical Impression Statement "Carlos Pierce" was referred for OPST intervention to address aphasia and memory deficits related to dementia. SLP targeted and instructed use of communication supports to optimize verbal descriptions of favorite items/places. Visual aids were successful to support current cognitive communication in conversation today with occasional to usual cues. Pt is presenting with more noticable decline in language, recall, and attention in therapy, in which SLP provided education and recommendations to support patient given progressive nature of dementia. Due to decline in expressive aphasia, memory, and attention, pt would continue to benefit from additional ST services to optimize current cognitive communciation for improved  communication effectiveness and to increase QOL.   ? Speech Therapy Frequency 2x / week   ? Duration 12 weeks   ? Treatment/Interventions Compensatory strategies;Cueing hierarchy;Functional tasks;Patient/family education;Cognitive reorganization;Multimodal communcation approach;Language facilitation;Compensatory techniques;Internal/external aids;SLP instruction and feedback   ? Potential to [place]   ? Potential Considerations Ability to learn/carryover information;Previous level of function;Severity of impairments;Medical prognosis   ? SLP Home Exercise Plan provided   ? Consulted and Agree with Plan of Care Patient;Family member/caregiver   ? ?  ?  ? ?  ? ? ?Patient will  benefit from skilled therapeutic intervention in order to improve the following deficits and impairments:   ?Cognitive communication deficit ? ?Aphasia ? ? ? ?Problem List ?Patient Active Problem List  ? Diagnosis Date Noted  ? Obstructive sleep apnea treated with continuous positive airway pressure (CPAP) 01/08/2017  ? Medication management 07/30/2016  ? Slow urinary stream 03/21/2016  ? Hyperlipidemia 06/22/2014  ? Mild HTN 01/16/2013  ? HYPERLIPIDEMIA 02/17/2008  ? HEMORRHOIDS-INTERNAL 02/17/2008  ? GERD 02/12/2008  ? ? ?Marzetta Board, CCC-SLP ?08/17/2021, 4:20 PM ? ?[facility] ?[place] ?[place], [place], [address] ?Phone: [phone]   Fax:  [phone] ? ? ?Name: Carlos Pierce ?MRN: [id number] ?Date of Birth: 05-06-1941 ? ?

## 2021-08-21 ENCOUNTER — Other Ambulatory Visit: Payer: Self-pay

## 2021-08-21 ENCOUNTER — Ambulatory Visit: Payer: Medicare HMO

## 2021-08-21 DIAGNOSIS — R41841 Cognitive communication deficit: Secondary | ICD-10-CM

## 2021-08-21 DIAGNOSIS — R4701 Aphasia: Secondary | ICD-10-CM

## 2021-08-21 NOTE — Therapy (Signed)
[facility] ?[facility] ?[place], [place], [address] ?Phone: [phone]   Fax:  [phone] ? ?Speech Language Pathology Treatment ? ?Patient Details  ?Name: Carlos Pierce ?MRN: [id number] ?Date of Birth: 1941-06-04 ?Referring Provider (SLP): Nickola Major, MD ? ? ?Encounter Date: 08/21/2021 ? ? End of Session - 08/21/21 8768   ? ? Visit Number 16   ? Number of Visits 25   ? Date for SLP Re-Evaluation 09/15/21   ? Authorization Type [employer]   ? Authorization Time Period requested auth for 16 visits   ? Authorization - Visit Number 15   ? Authorization - Number of Visits 16   ? SLP Start Time 0845   ? SLP Stop Time  0930   ? SLP Time Calculation (min) 45 min   ? Activity Tolerance Patient tolerated treatment well   ? ?  ?  ? ?  ? ? ?Past Medical History:  ?Diagnosis Date  ? Memory loss   ? ? ?Past Surgical History:  ?Procedure Laterality Date  ? CATARACT EXTRACTION Bilateral   ? HEMORRHOID BANDING  2016  ? ? ?There were no vitals filed for this visit. ? ? Subjective Assessment - 08/21/21 0847   ? ? Subjective "we were supposed to go..."   ? Patient is accompained by: Family member   wife, Carlos Pierce  ? Currently in Pain? No/denies   ? ?  ?  ? ?  ? ? ? ? ? ? ? ? ADULT SLP TREATMENT - 08/21/21 0838   ? ?  ? General Information  ? Behavior/Cognition Alert;Pleasant mood;Cooperative;Confused;Requires cueing   ?  ? Treatment Provided  ? Treatment provided Cognitive-Linquistic   ?  ? Cognitive-Linquistic Treatment  ? Treatment focused on Cognition;Aphasia;Patient/family/caregiver education   ? Skilled Treatment Increased anomia exhibited in opening conversation, with SLP cuing and instructing use of visual supports to aid recall and naming. Intermittent mid to mod A required to ID pertinent information. SLP educated wife on how to modify supports to optimize pt comprehension and attention (photos with captions, less written information, larger  images). Pt exhibited awareness that he missed novel information from last session as he was rifling through papers indicating awareness of reduced attention. SLP educated impact of reduced attention on comprehension and recall and strategies to utilize, in which pt indicated understanding.   ?  ? Assessment / Recommendations / Plan  ? Plan Continue with current plan of care   ?  ? Progression Toward Goals  ? Progression toward goals Progressing toward goals   ? ?  ?  ? ?  ? ? ? SLP Education - 08/21/21 0933   ? ? Education Details communication binder, modifications to aid verbal expression and recall/attention, attention strategies   ? Person(s) Educated Patient;Spouse   ? Methods Explanation;Demonstration;Verbal cues;Handout   ? Comprehension Verbalized understanding;Returned demonstration;Need further instruction   ? ?  ?  ? ?  ? ? ? SLP Short Term Goals - 07/31/21 0840   ? ?  ? SLP SHORT TERM GOAL #1  ? Title Pt will utilize external memory/communication aids to facilitate naming and recall of personally relevant information given usual mod A over 2 sessions   ? Baseline 07-28-21   ? Status Partially Met   ?  ? SLP SHORT TERM GOAL #2  ? Title Pt will identify and correct phonemic paraphasias/articulation errors with usual mod A for 2/5 opportunities on structured speech tasks over 2 sessions   ?  Baseline 07-07-21, 07-17-21   ? Status Achieved   ?  ? SLP SHORT TERM GOAL #3  ? Title Caregiver will utilize auditory processing/comprehension strategies to aid attention and comprehension in simple conversation given occasional min A over 2 sessions   ? Baseline 07-07-21, 07-21-21   ? Status Achieved   ?  ? SLP SHORT TERM GOAL #4  ? Title Pt will utilize word finding compensations in 5 minute simple conversation with usual mod A  over 2 sessions   ? Baseline 07-13-21   ? Status Partially Met   ?  ? SLP SHORT TERM GOAL #5  ? Title Caregivers will modify work environment (as needed) to compensate for cognitive communication  deficits with occasional min A over 2 sessions   ? Status Deferred   ? ?  ?  ? ?  ? ? ? SLP Long Term Goals - 08/21/21 0839   ? ?  ? SLP LONG TERM GOAL #1  ? Title Pt will utilize external memory/communication aids to facilitate naming and recall of personally relevant information given occasional min A over 2 sessions   ? Baseline 08-03-21, 08-07-21   ? Status Achieved   ?  ? SLP LONG TERM GOAL #2  ? Title Pt will identify and correct phonemic paraphasias/articulation errors with usual mod A for 6/8 opportunities over 2 sessions   ? Baseline 08-03-21   ? Time 5   ? Period Weeks   ? Status On-going   ?  ? SLP LONG TERM GOAL #3  ? Title Caregiver will utilize auditory processing/comprehension strategies to aid attention and comprehension in simple conversation given rare min A over 2 sessions   ? Baseline 08-07-21   ? Time 5   ? Period Weeks   ? Status On-going   ?  ? SLP LONG TERM GOAL #4  ? Title Pt will utilize word finding compensations in 10 minute simple conversation with occasional mod A over 2 sessions   ? Time 5   ? Period Weeks   ? Status On-going   ?  ? SLP LONG TERM GOAL #5  ? Title Pt will successfully continue work at [employer] with family support and external communication/memory aids to optimize communication effectiveness by last ST session   ? Time 5   ? Period Weeks   ? Status On-going   ? ?  ?  ? ?  ? ? ? Plan - 08/21/21 0839   ? ? Clinical Impression Statement "Carlos Pierce" was referred for OPST intervention to address aphasia and memory deficits related to dementia. SLP targeted and instructed use of communication supports to optimize verbal descriptions of favorite items/places. Visual aids were successful to support current cognitive communication in conversation today with occasional to usual cues. Pt is presenting with more noticable decline in language and attention in therapy, in which SLP provided education and recommendations to support patient with communication/memory book given  progressive nature of dementia. Due to decline in expressive aphasia, memory, and attention, pt would continue to benefit from additional ST services to optimize current cognitive communciation for improved communication effectiveness and to increase QOL.   ? Speech Therapy Frequency 2x / week   ? Duration 12 weeks   ? Treatment/Interventions Compensatory strategies;Cueing hierarchy;Functional tasks;Patient/family education;Cognitive reorganization;Multimodal communcation approach;Language facilitation;Compensatory techniques;Internal/external aids;SLP instruction and feedback   ? Potential to [place]   ? Potential Considerations Ability to learn/carryover information;Previous level of function;Severity of impairments;Medical prognosis   ? SLP Home  Exercise Plan provided   ? Consulted and Agree with Plan of Care Patient;Family member/caregiver   ? ?  ?  ? ?  ? ? ?Patient will benefit from skilled therapeutic intervention in order to improve the following deficits and impairments:   ?Cognitive communication deficit ? ?Aphasia ? ? ? ?Problem List ?Patient Active Problem List  ? Diagnosis Date Noted  ? Obstructive sleep apnea treated with continuous positive airway pressure (CPAP) 01/08/2017  ? Medication management 07/30/2016  ? Slow urinary stream 03/21/2016  ? Hyperlipidemia 06/22/2014  ? Mild HTN 01/16/2013  ? HYPERLIPIDEMIA 02/17/2008  ? HEMORRHOIDS-INTERNAL 02/17/2008  ? GERD 02/12/2008  ? ? ?Marzetta Board, CCC-SLP ?08/21/2021, 10:21 AM ? ?[facility] ?[place] ?[place], [place], [address] ?Phone: [phone]   Fax:  [phone] ? ? ?Name: PASQUAL FARIAS ?MRN: [id number] ?Date of Birth: [birth year] ? ?

## 2021-08-23 ENCOUNTER — Other Ambulatory Visit: Payer: Self-pay

## 2021-08-23 ENCOUNTER — Ambulatory Visit: Payer: Medicare HMO

## 2021-08-23 DIAGNOSIS — R41841 Cognitive communication deficit: Secondary | ICD-10-CM | POA: Diagnosis not present

## 2021-08-24 ENCOUNTER — Ambulatory Visit: Payer: Medicare HMO | Admitting: Cardiovascular Disease

## 2021-08-24 ENCOUNTER — Encounter: Payer: Self-pay | Admitting: Cardiovascular Disease

## 2021-08-24 VITALS — BP 116/66 | HR 60 | Ht 66.0 in | Wt 154.0 lb

## 2021-08-24 DIAGNOSIS — R413 Other amnesia: Secondary | ICD-10-CM | POA: Diagnosis not present

## 2021-08-24 DIAGNOSIS — G4733 Obstructive sleep apnea (adult) (pediatric): Secondary | ICD-10-CM

## 2021-08-24 DIAGNOSIS — E782 Mixed hyperlipidemia: Secondary | ICD-10-CM

## 2021-08-24 DIAGNOSIS — I10 Essential (primary) hypertension: Secondary | ICD-10-CM | POA: Diagnosis not present

## 2021-08-24 DIAGNOSIS — Z8616 Personal history of COVID-19: Secondary | ICD-10-CM

## 2021-08-24 DIAGNOSIS — E039 Hypothyroidism, unspecified: Secondary | ICD-10-CM

## 2021-08-24 LAB — COMPREHENSIVE METABOLIC PANEL
ALT: 41 IU/L (ref 0–44)
AST: 27 IU/L (ref 0–40)
Albumin/Globulin Ratio: 1.9 (ref 1.2–2.2)
Albumin: 4.4 g/dL (ref 3.7–4.7)
Alkaline Phosphatase: 61 IU/L (ref 44–121)
BUN/Creatinine Ratio: 16 (ref 10–24)
BUN: 22 mg/dL (ref 8–27)
Bilirubin Total: 0.4 mg/dL (ref 0.0–1.2)
CO2: 22 mmol/L (ref 20–29)
Calcium: 9.7 mg/dL (ref 8.6–10.2)
Chloride: 107 mmol/L — ABNORMAL HIGH (ref 96–106)
Creatinine, Ser: 1.34 mg/dL — ABNORMAL HIGH (ref 0.76–1.27)
Globulin, Total: 2.3 g/dL (ref 1.5–4.5)
Glucose: 100 mg/dL — ABNORMAL HIGH (ref 70–99)
Potassium: 4.6 mmol/L (ref 3.5–5.2)
Sodium: 143 mmol/L (ref 134–144)
Total Protein: 6.7 g/dL (ref 6.0–8.5)
eGFR: 54 mL/min/{1.73_m2} — ABNORMAL LOW (ref >59)

## 2021-08-24 LAB — CBC
Hematocrit: 40.5 % (ref 37.5–51.0)
Hemoglobin: 13.7 g/dL (ref 13.0–17.7)
MCH: 29.9 pg (ref 26.6–33.0)
MCHC: 33.8 g/dL (ref 31.5–35.7)
MCV: 88 fL (ref 79–97)
Platelets: 161 10*3/uL (ref 150–450)
RBC: 4.58 x10E6/uL (ref 4.14–5.80)
RDW: 13 % (ref 11.6–15.4)
WBC: 7.1 10*3/uL (ref 3.4–10.8)

## 2021-08-24 LAB — LIPID PANEL
Chol/HDL Ratio: 3.3 ratio (ref 0.0–5.0)
Cholesterol, Total: 196 mg/dL (ref 100–199)
HDL: 60 mg/dL (ref >39)
LDL Chol Calc (NIH): 115 mg/dL — ABNORMAL HIGH (ref 0–99)
Triglycerides: 116 mg/dL (ref 0–149)
VLDL Cholesterol Cal: 21 mg/dL (ref 5–40)

## 2021-08-24 MED ORDER — EZETIMIBE 10 MG PO TABS: 10.0000 mg | ORAL_TABLET | Freq: Every day | ORAL | 3 refills | Status: DC

## 2021-08-24 MED ORDER — OMEGA-3-ACID ETHYL ESTERS 1 G PO CAPS: 1.0000 g | ORAL_CAPSULE | Freq: Every day | ORAL | 3 refills | Status: DC

## 2021-08-24 NOTE — Therapy (Signed)
[facility] ?[facility] ?[address] ?[place], [place], [address] ?Phone: [phone]   Fax:  [phone] ? ?Speech Language Pathology Treatment ? ?Patient Details  ?Name: Carlos Pierce ?MRN: [id number] ?Date of Birth: 02/04/1941 ?Referring Provider (SLP): Eksir, Samantha A, MD ? ? ?Encounter Date: 08/23/2021 ? ? End of Session - 08/23/21 1537   ? ? Visit Number 17   ? Number of Visits 25   ? Date for SLP Re-Evaluation 09/15/21   ? Authorization Type [employer]   ? Authorization Time Period requested auth for 16 visits - will request 6 more visits   ? Authorization - Visit Number 16   ? Authorization - Number of Visits 16   ? SLP Start Time 1535   ? SLP Stop Time  1615   ? SLP Time Calculation (min) 40 min   ? Activity Tolerance Patient tolerated treatment well   ? ?  ?  ? ?  ? ? ?Past Medical History:  ?Diagnosis Date  ? Memory loss   ? ? ?Past Surgical History:  ?Procedure Laterality Date  ? CATARACT EXTRACTION Bilateral   ? HEMORRHOID BANDING  2016  ? ? ?There were no vitals filed for this visit. ? ? Subjective Assessment - 08/24/21 0830   ? ? Subjective showed SLP binder   ? Patient is accompained by: Family member   ? Currently in Pain? No/denies   ? ?  ?  ? ?  ? ? ? ? ? ? ? ? ADULT SLP TREATMENT - 08/24/21 0831   ? ?  ? General Information  ? Behavior/Cognition Alert;Pleasant mood;Cooperative;Confused;Requires cueing   ?  ? Treatment Provided  ? Treatment provided Cognitive-Linquistic   ?  ? Cognitive-Linquistic Treatment  ? Treatment focused on Cognition;Aphasia;Patient/family/caregiver education   ? Skilled Treatment Pt entered with communication/memory binder. Pt's wife able to implement previous SLP recommendations to aid organization and pt comprehension of external aid. Usual fading to occasional min to mod A required to locate and ID requested information and/or provide simple explanation of caption. Pt would benefit from continuation of SLP  services to modify external communication/memory supports but wife requested short break from ST due to personal family matter.   ?  ? Assessment / Recommendations / Plan  ? Plan Continue with current plan of care   hold for 1 month due to personal family matter  ?  ? Progression Toward Goals  ? Progression toward goals Progressing toward goals   ? ?  ?  ? ?  ? ? ? ? ? SLP Short Term Goals - 07/31/21 0840   ? ?  ? SLP SHORT TERM GOAL #1  ? Title Pt will utilize external memory/communication aids to facilitate naming and recall of personally relevant information given usual mod A over 2 sessions   ? Baseline 07-28-21   ? Status Partially Met   ?  ? SLP SHORT TERM GOAL #2  ? Title Pt will identify and correct phonemic paraphasias/articulation errors with usual mod A for 2/5 opportunities on structured speech tasks over 2 sessions   ? Baseline 07-07-21, 07-17-21   ? Status Achieved   ?  ? SLP SHORT TERM GOAL #3  ? Title Caregiver will utilize auditory processing/comprehension strategies to aid attention and comprehension in simple conversation given occasional min A over 2 sessions   ? Baseline 07-07-21, 07-21-21   ? Status Achieved   ?  ? SLP SHORT TERM GOAL #4  ? Title Pt   will utilize word finding compensations in 5 minute simple conversation with usual mod A  over 2 sessions   ? Baseline 07-13-21   ? Status Partially Met   ?  ? SLP SHORT TERM GOAL #5  ? Title Caregivers will modify work environment (as needed) to compensate for cognitive communication deficits with occasional min A over 2 sessions   ? Status Deferred   ? ?  ?  ? ?  ? ? ? SLP Long Term Goals - 08/24/21 0835   ? ?  ? SLP LONG TERM GOAL #1  ? Title Pt will utilize external memory/communication aids to facilitate naming and recall of personally relevant information given occasional min A over 2 sessions   ? Baseline 08-03-21, 08-07-21   ? Status Achieved   ?  ? SLP LONG TERM GOAL #2  ? Title Pt will identify and correct phonemic paraphasias/articulation errors  with usual mod A for 6/8 opportunities over 2 sessions   ? Baseline 08-03-21   ? Time 5   ? Period Weeks   ? Status On-going   ?  ? SLP LONG TERM GOAL #3  ? Title Caregiver will utilize auditory processing/comprehension strategies to aid attention and comprehension in simple conversation given rare min A over 2 sessions   ? Baseline 08-07-21   ? Time 5   ? Period Weeks   ? Status On-going   ?  ? SLP LONG TERM GOAL #4  ? Title Pt will utilize word finding compensations in 10 minute simple conversation with occasional mod A over 2 sessions   ? Time 5   ? Period Weeks   ? Status On-going   ?  ? SLP LONG TERM GOAL #5  ? Title Pt will successfully continue work at [employer] with family support and external communication/memory aids to optimize communication effectiveness by last ST session   ? Time 5   ? Period Weeks   ? Status On-going   ? ?  ?  ? ?  ? ? ? Plan - 08/24/21 0835   ? ? Clinical Impression Statement "[name]" was referred for OPST intervention to address aphasia and memory deficits related to dementia. SLP targeted and instructed use of communication supports to optimize verbal descriptions of favorite items/places. Visual aids were successful to support current cognitive communication in conversation today with occasional to usual cues. Pt is presenting with more noticable decline in language and attention in therapy, in which SLP provided education and recommendations to support patient with communication/memory book given progressive nature of dementia. Due to decline in expressive aphasia, memory, and attention, pt would continue to benefit from additional ST services to optimize current cognitive communciation for improved communication effectiveness and to increase QOL.   ? Speech Therapy Frequency 2x / week   ? Duration 12 weeks   ? Treatment/Interventions Compensatory strategies;Cueing hierarchy;Functional tasks;Patient/family education;Cognitive reorganization;Multimodal communcation  approach;Language facilitation;Compensatory techniques;Internal/external aids;SLP instruction and feedback   ? Potential to [place]   ? Potential Considerations Ability to learn/carryover information;Previous level of function;Severity of impairments;Medical prognosis   ? SLP Home Exercise Plan provided   ? Consulted and Agree with Plan of Care Patient;Family member/caregiver   ? ?  ?  ? ?  ? ? ?Patient will benefit from skilled therapeutic intervention in order to improve the following deficits and impairments:   ?Cognitive communication deficit ? ?Aphasia ? ? ? ?Problem List ?Patient Active Problem List  ? Diagnosis Date Noted  ? Obstructive  sleep apnea treated with continuous positive airway pressure (CPAP) 01/08/2017  ? Medication management 07/30/2016  ? Slow urinary stream 03/21/2016  ? Hyperlipidemia 06/22/2014  ? Mild HTN 01/16/2013  ? HYPERLIPIDEMIA 02/17/2008  ? HEMORRHOIDS-INTERNAL 02/17/2008  ? GERD 02/12/2008  ? ? ?Marzetta Board, CCC-SLP ?08/24/2021, 8:36 AM ? ?[facility] ?[place] ?[place], [place], [address] ?Phone: [phone]   Fax:  [phone] ? ? ?Name: EMILIAN STAWICKI ?MRN: [id number] ?Date of Birth: 1941-03-14 ? ?

## 2021-08-24 NOTE — Patient Instructions (Signed)
Medication Instructions:  ?Your physician recommends that you continue on your current medications as directed. Please refer to the Current Medication list given to you today. ? ?*If you need a refill on your cardiac medications before your next appointment, please call your pharmacy* ? ? ?Lab Work: ?Your physician recommends that you have labs drawn today: CMET, CBC, Lipids, TSH ? ?If you have labs (blood work) drawn today and your tests are completely normal, you will receive your results only by: ?MyChart Message (if you have MyChart) OR ?A paper copy in the mail ?If you have any lab test that is abnormal or we need to change your treatment, we will call you to review the results. ? ? ?Follow-Up: ?At [facility], you and your health needs are our priority.  As part of our continuing mission to provide you with exceptional heart care, we have created designated Provider Care Teams.  These Care Teams include your primary Cardiologist (physician) and Advanced Practice Providers (APPs -  Physician Assistants and Nurse Practitioners) who all work together to provide you with the care you need, when you need it. ? ?We recommend signing up for the patient portal called "MyChart".  Sign up information is provided on this After Visit Summary.  MyChart is used to connect with patients for Virtual Visits (Telemedicine).  Patients are able to view lab/test results, encounter notes, upcoming appointments, etc.  Non-urgent messages can be sent to your provider as well.   ?To learn more about what you can do with MyChart, go to [email or web address].   ? ?Your next appointment:   ?12 month(s) ? ?The format for your next appointment:   ?In Person ? ?Provider:   ?Shelva Majestic, MD ?

## 2021-08-24 NOTE — Progress Notes (Signed)
Patient ID: Carlos SUNGA, male   DOB: 03/30/1941, 81 y.o.   MRN: [id number] ? ? ? ? ?HPI: Carlos Pierce is a 81 y.o. male who presents to the office today for a 43 month cardiology evaluation. ? ?Carlos Pierce  has a history of mild hypertension and hyperlipidemia. Remotely, he has had mild elevation of PSAs which have been followed by Drs [name] and more recently Dr. [name].  He has had mild transaminase elevation in the past on Crestor. ? ?In 2013 laboratory revealed  a total cholesterol 213 triglycerides 136 HDL 46 LDL 140. PSA was 3.19. Normal LFTs.  He has tolerated  Livalo 2 mg in addition to Zetia 10 mg. Repeat blood work on 01/12/2013 showed a total cholesterol 144 HDL 55 LDL 70 VLDL 19 triglycerides 96. He is tolerating the medicine well without side effects. Liver function studies have remained normal; PSA  decreased at 2.61. He remains active and exercises daily. Over the past 6 years he has lost 15-20 pounds as a result of Carlos activity and improve diet. ? ?He has continued to exercise at least  5 days per week.  He exercises on a stationary bike for over an hour for approximately 18 miles 3 days per week and on alternating days does low resistance training. ? ?He had undergone a colonoscopy by Dr. [name].He denies chest pain PND or orthopnea. He denies palpitations. He denies myalgias.  He had experienced one episode where he said up very abruptly and did have transient dizziness where he stumbled.  He has not had recurrence of this.  He denied associated palpitations.  He denied vertigo. ? ?In December 2016 lipid studies were excellent with a total cholesterol 141, triglycerides 96, HDL 47 and LDL 75.  PSA was 3.54.  ? ?In 06/29/2016  CBC was normal.  PSA was 2.7.  BUN27, creatinine 1.17.  LFTs were normal.  Lipid studies continue to be excellent with a total cholesterol of 146, triglycerides 1:30, HDL 51, and LDL 69.  ? ?Since I saw him in January 2018, he has had memory issues.   He also had some development of lower back discomfort.  He held off exercise for approximately 2 months but is now back exercising again.  He denies chest pain or shortness of breath.  He has now required hearing aids.  He underwent a sleep evaluation by the neurologist and was found to have mild sleep apnea.  Repeat blood pressure was 118/64 he uses CPAP for obstructive sleep apnea and admits to 100% compliance.  Laboratory in January 2019 showed a total cholesterol 173, HDL 56, LDL 94 and triglycerides 130.  He was Zetia and pravastatin 40 mg.  He has tried [employer], mining for Carlos short-term memory loss with plus minus benefit.  He continues to be on lisinopril 5 mg for hypertension.  GERD is controlled with Zantac.  Due to concerns for memory issues and statins he ultimately was advised to discontinue pravastatin by neurology. ? ?Since I saw him in April 2019, he has been followed by Dr. [name] and is now donepezil 10 mg at bedtime in addition to memantine 10 mg twice a day.  Carlos Pierce was wondering about potential additional evaluation elsewhere.  According to the patient not had any major improvement in memory but perhaps there is not been as rapid deterioration as there had in the past.  At times Carlos Pierce has noticed that it is difficult for him to get words out.  The patient denies  any chest tightness or pressure.  He denied palpitations, presyncope or syncope.   ? ?I saw him on March 2020.  Laboratory on August 12, 2018  showed mild glucose elevation at 106.  Renal function was improved with a creatinine of 1.26.  Fair to 5 months ago lipid studies are significantly improved with total cholesterol improving from 261 to 175, triglycerides 155 to63, and LDL cholesterol from 180 to 110.  He continues to take Zetia.  He also has been taking Carlos son's lovaza generic omega 3 fatty acid.  In retrospect, he does not believe the pravastatin contributed to any of Carlos memory issues.   ? ?In 2020 He contracted COVID-19 from  Carlos son and had symptoms of fever for several days, cough, loss of taste and smell.  He had taken some of Carlos Pierce's doxycycline days and he believes this may have ameliorated some of Carlos symptoms.  However he did note significant residual fatigability.  He admits to fatigue.  He continues to have difficulty losing Carlos words and recently saw Carlos neurologist who scheduled him for repeat sleep study which is scheduled for October 13.  He denies chest pain.  He denies palpitations.  He is not taking rosuvastatin but is taking Zetia 10 mg as well as lisinopril 5 mg daily and lovasa 1 capsule daily.   ? ?I evaluated him in a telemedicine visit in April 2021.  He had undergone a sleep study by Dr. [name] and initially was tried on BiPAP therapy.  He was ultimately switched to ASV therapy with an EPAP of 15, pressure support minimum of 3 maximum of 10 with 1 L of supplemental oxygen.  He was continuing to have some memory difficulty and continues to be on Namenda and Aricept. ? ?I last saw him on July 18, 2020.  Since Carlos prior evaluation he had undergone resection of a basal cell cancer on Carlos nose.  He uses hearing aids for difficulty hearing.  He continues to use ASV therapy.  He denies chest pain.  He is no longer taking lisinopril.  He continues to be on Zetia 10 mg and Lovaza for Carlos mixed hyperlipidemia.  He continues to be on Aricept and Namenda for Carlos dementia.  He had not had recent laboratory repeat laboratory was recommended. ? ?Since I last saw him, he has been undergoing speech therapy since Carlos dementia now seems to be more affecting Carlos speech.  He is unaware of any chest pain.  He continues to work in Carlos [employer] business.  He is not on any medication for blood pressure for him statin therapy and continues to be on Zetia for hyperlipidemia.  He continues to be on Aricept and Namenda for Carlos dementia and is on low-dose levothyroxine for mild hypothyroidism and Pepcid for GERD.  He presents for yearly  evaluation. ? ? ? ?Current Outpatient Medications  ?Medication Sig Dispense Refill  ? ACETYLCYSTEINE PO Take by mouth.    ? BRAN FIBER PO Take by mouth.    ? Coenzyme Q10 (CO Q 10 PO) Take 300 mg by mouth daily.    ? donepezil (ARICEPT) 10 MG tablet Take 1 tablet (10 mg total) by mouth at bedtime. 90 tablet 3  ? famotidine (PEPCID) 20 MG tablet Take 20 mg by mouth daily.     ? Ginkgo Biloba 40 MG TABS Take 240 mg by mouth daily.    ? L-Tyrosine 500 MG CAPS Take 1 capsule by mouth daily.    ?  levothyroxine (SYNTHROID) 25 MCG tablet Take 25 mcg by mouth daily before breakfast.    ? memantine (NAMENDA) 10 MG tablet Take 1 tablet (10 mg total) by mouth 2 (two) times daily. 180 tablet 3  ? Multiple Vitamins-Minerals (MULTIVITAMIN & MINERAL PO) Take 1 tablet by mouth daily.    ? ezetimibe (ZETIA) 10 MG tablet Take 1 tablet (10 mg total) by mouth daily. 90 tablet 3  ? omega-3 acid ethyl esters (LOVAZA) 1 g capsule Take 1 capsule (1 g total) by mouth daily. 90 capsule 3  ? ?No current facility-administered medications for this visit.  ? ? ?Social History  ? ?Socioeconomic History  ? Marital status: Married  ?  Spouse name: [name]  ? Number of children: 1  ? Years of education: 45  ? Highest education level: Not on file  ?Occupational History  ?  Comment: [employer]  ?Tobacco Use  ? Smoking status: Never  ? Smokeless tobacco: Never  ?Substance and Sexual Activity  ? Alcohol use: Yes  ?  Alcohol/week: 2.0 standard drinks  ?  Types: 2 Standard drinks or equivalent per week  ?  Comment: wine a few days a week  ? Drug use: No  ? Sexual activity: Not on file  ?Other Topics Concern  ? Not on file  ?Social History Narrative  ? Drinks 1 cup of coffee a day   ? ?Social Determinants of Health  ? ?Financial Resource Strain: Not on file  ?Food Insecurity: Not on file  ?Transportation Needs: Not on file  ?Physical Activity: Not on file  ?Stress: Not on file  ?Social Connections: Not on file  ?Intimate Partner Violence: Not  on file  ? ? ?Additional social history is notable in that he is married has one child who is also my patient who has a history of hyperlipidemia.  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 ? ?ROS

## 2021-08-25 ENCOUNTER — Encounter: Payer: Self-pay | Admitting: Cardiovascular Disease

## 2021-09-20 ENCOUNTER — Ambulatory Visit: Payer: Medicare HMO | Attending: Family Medicine

## 2021-09-20 DIAGNOSIS — R41841 Cognitive communication deficit: Secondary | ICD-10-CM | POA: Insufficient documentation

## 2021-09-20 DIAGNOSIS — R4701 Aphasia: Secondary | ICD-10-CM | POA: Diagnosis present

## 2021-09-21 NOTE — Therapy (Signed)
[facility] ?[place] ?[place], [place], [address] ?Phone: [phone]   Fax:  [phone] ? ?Speech Language Pathology Treatment/Recert ? ?Patient Details  ?Name: Carlos Pierce ?MRN: [id number] ?Date of Birth: [birth year] ?Referring Provider (SLP): Nickola Major, MD ? ? ?Encounter Date: 09/20/2021 ? ? End of Session - 09/20/21 1446   ? ? Visit Number 18   ? Number of Visits 25   ? Date for SLP Re-Evaluation 19/50/93   recert for 1x/week for 6 weeks  ? Authorization Type [employer]   ? Authorization Time Period 7 SLP visits 08/23/21 - 10/27/21   ? Authorization - Visit Number 1   ? Authorization - Number of Visits 7   ? SLP Start Time 1401   ? SLP Stop Time  1443   ? SLP Time Calculation (min) 42 min   ? Activity Tolerance Patient tolerated treatment well   ? ?  ?  ? ?  ? ? ?Past Medical History:  ?Diagnosis Date  ? Memory loss   ? ? ?Past Surgical History:  ?Procedure Laterality Date  ? CATARACT EXTRACTION Bilateral   ? HEMORRHOID BANDING  2016  ? ? ?There were no vitals filed for this visit. ? ? Subjective Assessment - 09/20/21 1404   ? ? Subjective "nothing"   ? Patient is accompained by: Family member   ? Currently in Pain? No/denies   ? ?  ?  ? ?  ? ? ? ? ? ? ? ? ADULT SLP TREATMENT - 09/20/21 1405   ? ?  ? General Information  ? Behavior/Cognition Alert;Pleasant mood;Cooperative;Confused;Requires cueing   ?  ? Treatment Provided  ? Treatment provided Cognitive-Linquistic   ?  ? Cognitive-Linquistic Treatment  ? Treatment focused on Cognition;Aphasia;Patient/family/caregiver education   ? Skilled Treatment Pt returned to [place] after several weeks due to personal family matter. Since last ST session, pt has increased physical activity with target of daily exercises. SLP provided positive reinforcement given possitve correlation between physical activity and brain health. SLP re-educated use of strategie to aid anomia as anomia occured  intermittently in conversation today. SLP re-educated importance of daily reading of memory/communication book. For recert, wife, [name], identified increased difficulty with names and recalling how to use remote at home. SLP will target in upcoming ST sessions.   ?  ? Assessment / Recommendations / Plan  ? Plan Continue with current plan of care;Goals updated   ?  ? Progression Toward Goals  ? Progression toward goals Progressing toward goals   ? ?  ?  ? ?  ? ? ? SLP Education - 09/21/21 0845   ? ? Education Details goals update, anomia compensations, daily use of memory/communication book   ? Person(s) Educated Patient;Spouse   ? Methods Explanation;Demonstration;Verbal cues   ? Comprehension Verbalized understanding;Returned demonstration;Need further instruction   ? ?  ?  ? ?  ? ? ? SLP Short Term Goals - 07/31/21 0840   ? ?  ? SLP SHORT TERM GOAL #1  ? Title Pt will utilize external memory/communication aids to facilitate naming and recall of personally relevant information given usual mod A over 2 sessions   ? Baseline 07-28-21   ? Status Partially Met   ?  ? SLP SHORT TERM GOAL #2  ? Title Pt will identify and correct phonemic paraphasias/articulation errors with usual mod A for 2/5 opportunities on structured speech tasks over 2 sessions   ? Baseline 07-07-21, 07-17-21   ?  Status Achieved   ?  ? SLP SHORT TERM GOAL #3  ? Title Caregiver will utilize auditory processing/comprehension strategies to aid attention and comprehension in simple conversation given occasional min A over 2 sessions   ? Baseline 07-07-21, 07-21-21   ? Status Achieved   ?  ? SLP SHORT TERM GOAL #4  ? Title Pt will utilize word finding compensations in 5 minute simple conversation with usual mod A  over 2 sessions   ? Baseline 07-13-21   ? Status Partially Met   ?  ? SLP SHORT TERM GOAL #5  ? Title Caregivers will modify work environment (as needed) to compensate for cognitive communication deficits with occasional min A over 2 sessions   ? Status  Deferred   ? ?  ?  ? ?  ? ? ? SLP Long Term Goals - 09/21/21 0906   ? ?  ? SLP LONG TERM GOAL #1  ? Title Pt will utilize external memory/communication aids to facilitate naming and recall of personally relevant information given occasional min A over 2 sessions   ? Baseline 08-03-21, 08-07-21   ? Status Achieved   ?  ? SLP LONG TERM GOAL #2  ? Title Pt will identify and correct phonemic paraphasias/articulation errors with usual mod A for 2/4 opportunities over 2 sessions   ? Baseline 08-03-21   ? Time 6   ? Period Weeks   ? Status Revised   ongoing for recert  ?  ? SLP LONG TERM GOAL #3  ? Title Caregiver will utilize auditory processing/comprehension strategies to aid attention and comprehension in simple conversation given rare min A over 2 sessions   ? Baseline 08-07-21, 09-21-21   ? Status Achieved   ?  ? SLP LONG TERM GOAL #4  ? Title Pt will utilize word finding compensations in 10 minute simple conversation with usual mod A over 2 sessions   ? Baseline 09-21-21   ? Time 6   ? Period Weeks   ? Status Revised   ?  ? SLP LONG TERM GOAL #5  ? Title Pt will successfully continue work at family [employer] with family support and external communication/memory aids to optimize communication effectiveness by last ST session   ? Time 6   ? Period Weeks   ? Status On-going   ongoing for recert  ?  ? SLP LONG TERM GOAL #6  ? Title Utilizing [employer], pt will recall targeted stimuli with improved accuracy over 3 sessions   ? Time 6   ? Period Weeks   ? Status New   ? ?  ?  ? ?  ? ? ? Plan - 09/20/21 1447   ? ? Clinical Impression Statement "Carlos Pierce" was referred for OPST intervention to address aphasia and memory deficits related to dementia. SLP re-educated daily use of communication supports to optimize recall and verbal communication of pertinent biographical information and favorite items/places. SLP and family identified new goals related to communication (names of pertinent employees) and recall of functional tasks  (using remote) to target in upcoming ST sessions. Recert completed for 1x/week for 6 weeks to address additional goals. Pt would continue to benefit from additional ST services to optimize current cognitive communciation for improved communication effectiveness and to increase QOL.   ? Speech Therapy Frequency 1x /week   ? Duration Other (comment)   1x/week for 6 weeks for recert  ? Treatment/Interventions Compensatory strategies;Cueing hierarchy;Functional tasks;Patient/family education;Cognitive reorganization;Multimodal communcation approach;Language facilitation;Compensatory techniques;Internal/external aids;SLP  instruction and feedback   ? Potential to [place]   ? Potential Considerations Ability to learn/carryover information;Previous level of function;Severity of impairments;Medical prognosis   ? SLP Home Exercise Plan provided   ? Consulted and Agree with Plan of Care Patient;Family member/caregiver   ? ?  ?  ? ?  ? ? ?Patient will benefit from skilled therapeutic intervention in order to improve the following deficits and impairments:   ?Aphasia ? ?Cognitive communication deficit ? ? ? ?Problem List ?Patient Active Problem List  ? Diagnosis Date Noted  ? Obstructive sleep apnea treated with continuous positive airway pressure (CPAP) 01/08/2017  ? Medication management 07/30/2016  ? Slow urinary stream 03/21/2016  ? Hyperlipidemia 06/22/2014  ? Mild HTN 01/16/2013  ? HYPERLIPIDEMIA 02/17/2008  ? HEMORRHOIDS-INTERNAL 02/17/2008  ? GERD 02/12/2008  ? ? ?Marzetta Board, CCC-SLP ?09/21/2021, 9:08 AM ? ?[facility] ?[place] ?[place], [place], [address] ?Phone: [phone]   Fax:  [phone] ? ? ?Name: Carlos Pierce ?MRN: [id number] ?Date of Birth: 01-Feb-1941 ? ?

## 2021-09-27 ENCOUNTER — Ambulatory Visit: Payer: Medicare HMO

## 2021-09-27 DIAGNOSIS — R4701 Aphasia: Secondary | ICD-10-CM

## 2021-09-27 DIAGNOSIS — R41841 Cognitive communication deficit: Secondary | ICD-10-CM

## 2021-09-27 NOTE — Therapy (Signed)
[facility] ?[place] ?[place], [place], [address] ?Phone: [phone]   Fax:  [phone] ? ?Speech Language Pathology Treatment ? ?Patient Details  ?Name: Carlos Pierce ?MRN: [id number] ?Date of Birth: Jun 08, 1941 ?Referring Provider (SLP): Carlos Major, MD ? ? ?Encounter Date: 09/27/2021 ? ? End of Session - 09/27/21 1625   ? ? Visit Number 19   ? Number of Visits 25   ? Date for SLP Re-Evaluation 11/03/21   ? Authorization Type [employer]   ? Authorization Time Period 7 SLP visits 08/23/21 - 10/27/21   ? Authorization - Visit Number 2   ? Authorization - Number of Visits 7   ? SLP Start Time 1402   ? SLP Stop Time  1448   ? SLP Time Calculation (min) 46 min   ? Activity Tolerance Patient tolerated treatment well   ? ?  ?  ? ?  ? ? ?Past Medical History:  ?Diagnosis Date  ? Memory loss   ? ? ?Past Surgical History:  ?Procedure Laterality Date  ? CATARACT EXTRACTION Bilateral   ? HEMORRHOID BANDING  2016  ? ? ?There were no vitals filed for this visit. ? ? Subjective Assessment - 09/27/21 1403   ? ? Subjective "it goes by fast" re: busy work days   ? Patient is accompained by: Family member   ? Currently in Pain? No/denies   ? ?  ?  ? ?  ? ? ? ? ? ? ? ? ADULT SLP TREATMENT - 09/27/21 1403   ? ?  ? General Information  ? Behavior/Cognition Alert;Pleasant mood;Cooperative;Confused;Requires cueing   ?  ? Treatment Provided  ? Treatment provided Cognitive-Linquistic   ?  ? Cognitive-Linquistic Treatment  ? Treatment focused on Cognition;Aphasia;Patient/family/caregiver education   ? Skilled Treatment SLP utilized Spaced Retrieval Therapy to address recall and correct usage of television remote (provided from home). Visual supports were effective to aid recall. Pt able to demonstrate targeted sequence with rare min A for correct button (slightly below targeted button). In conversation, usual anomia and reduced recall exhibited requiring  increased assistance from wife and SLP. Strategies and cues were rarely effective today, which may be related to cognitive component.   ?  ? Assessment / Recommendations / Plan  ? Plan Continue with current plan of care  ?  ? Progression Toward Goals  ? Progression toward goals Progressing toward goals   ? ?  ?  ? ?  ? ? ? SLP Education - 09/27/21 1624   ? ? Education Details Spaced Retrieval for remote usage, visual aids   ? Person(s) Educated Patient;Spouse   ? Methods Explanation;Demonstration;Verbal cues;Handout   ? Comprehension Returned demonstration;Verbalized understanding;Verbal cues required;Need further instruction   ? ?  ?  ? ?  ? ? ? SLP Short Term Goals - 07/31/21 0840   ? ?  ? SLP SHORT TERM GOAL #1  ? Title Pt will utilize external memory/communication aids to facilitate naming and recall of personally relevant information given usual mod A over 2 sessions   ? Baseline 07-28-21   ? Status Partially Met   ?  ? SLP SHORT TERM GOAL #2  ? Title Pt will identify and correct phonemic paraphasias/articulation errors with usual mod A for 2/5 opportunities on structured speech tasks over 2 sessions   ? Baseline 07-07-21, 07-17-21   ? Status Achieved   ?  ? SLP SHORT TERM GOAL #3  ? Title Caregiver will utilize  auditory processing/comprehension strategies to aid attention and comprehension in simple conversation given occasional min A over 2 sessions   ? Baseline 07-07-21, 07-21-21   ? Status Achieved   ?  ? SLP SHORT TERM GOAL #4  ? Title Pt will utilize word finding compensations in 5 minute simple conversation with usual mod A  over 2 sessions   ? Baseline 07-13-21   ? Status Partially Met   ?  ? SLP SHORT TERM GOAL #5  ? Title Caregivers will modify work environment (as needed) to compensate for cognitive communication deficits with occasional min A over 2 sessions   ? Status Deferred   ? ?  ?  ? ?  ? ? ? SLP Long Term Goals - 09/27/21 1625   ? ?  ? SLP LONG TERM GOAL #1  ? Title Pt will utilize external  memory/communication aids to facilitate naming and recall of personally relevant information given occasional min A over 2 sessions   ? Baseline 08-03-21, 08-07-21   ? Status Achieved   ?  ? SLP LONG TERM GOAL #2  ? Title Pt will identify and correct phonemic paraphasias/articulation errors with usual mod A for 2/4 opportunities over 2 sessions   ? Baseline 08-03-21   ? Time 5   ? Period Weeks   ? Status On-going   ongoing for recert  ?  ? SLP LONG TERM GOAL #3  ? Title Caregiver will utilize auditory processing/comprehension strategies to aid attention and comprehension in simple conversation given rare min A over 2 sessions   ? Baseline 08-07-21, 09-21-21   ? Status Achieved   ?  ? SLP LONG TERM GOAL #4  ? Title Pt will utilize word finding compensations in 10 minute simple conversation with usual mod A over 2 sessions   ? Baseline 09-21-21   ? Time 5   ? Period Weeks   ? Status On-going   ?  ? SLP LONG TERM GOAL #5  ? Title Pt will successfully continue work at [employer] with family support and external communication/memory aids to optimize communication effectiveness by last ST session   ? Time 5   ? Period Weeks   ? Status On-going   ongoing for recert  ?  ? SLP LONG TERM GOAL #6  ? Title Utilizing [employer], pt will recall targeted stimuli with improved accuracy over 3 sessions   ? Baseline 09-27-21   ? Time 5   ? Period Weeks   ? Status New   ? ?  ?  ? ?  ? ? ? Plan - 09/27/21 1625   ? ? Clinical Impression Statement "Carlos Pierce" was referred for OPST intervention to address aphasia and memory deficits related to dementia. SLP re-educated daily use of communication supports to optimize recall and verbal communication of pertinent biographical information and favorite items/places. SLP and family identified new goals related to communication (names of pertinent employees) and recall of functional tasks (using remote) to target in upcoming ST sessions. Pt would continue to benefit from additional ST services  to optimize current cognitive communciation for improved communication effectiveness and to increase QOL.   ? Speech Therapy Frequency 1x /week   ? Duration Other (comment)   1x/week for 6 weeks for recert  ? Treatment/Interventions Compensatory strategies;Cueing hierarchy;Functional tasks;Patient/family education;Cognitive reorganization;Multimodal communcation approach;Language facilitation;Compensatory techniques;Internal/external aids;SLP instruction and feedback   ? Potential to [place]   ? Potential Considerations Ability to learn/carryover information;Previous level of function;Severity of  impairments;Medical prognosis   ? SLP Home Exercise Plan provided   ? Consulted and Agree with Plan of Care Patient;Family member/caregiver   ? ?  ?  ? ?  ? ? ?Patient will benefit from skilled therapeutic intervention in order to improve the following deficits and impairments:   ?Aphasia ? ?Cognitive communication deficit ? ? ? ?Problem List ?Patient Active Problem List  ? Diagnosis Date Noted  ? Obstructive sleep apnea treated with continuous positive airway pressure (CPAP) 01/08/2017  ? Medication management 07/30/2016  ? Slow urinary stream 03/21/2016  ? Hyperlipidemia 06/22/2014  ? Mild HTN 01/16/2013  ? HYPERLIPIDEMIA 02/17/2008  ? HEMORRHOIDS-INTERNAL 02/17/2008  ? GERD 02/12/2008  ? ? ?Marzetta Board, CCC-SLP ?09/27/2021, 4:26 PM ? ?[facility] ?[facility] ?[place], [place], [address] ?Phone: [phone]   Fax:  [phone] ? ? ?Name: Carlos Pierce ?MRN: [id number] ?Date of Birth: [birth year] ? ?

## 2021-10-03 ENCOUNTER — Encounter: Payer: Self-pay | Admitting: Family Medicine

## 2021-10-04 ENCOUNTER — Ambulatory Visit: Payer: Medicare HMO | Admitting: Family Medicine

## 2021-10-04 ENCOUNTER — Encounter: Payer: Self-pay | Admitting: Family Medicine

## 2021-10-04 ENCOUNTER — Ambulatory Visit: Payer: Medicare HMO

## 2021-10-04 VITALS — BP 139/73 | HR 64 | Ht 66.0 in | Wt 154.6 lb

## 2021-10-04 DIAGNOSIS — R4701 Aphasia: Secondary | ICD-10-CM | POA: Diagnosis not present

## 2021-10-04 DIAGNOSIS — F03B0 Unspecified dementia, moderate, without behavioral disturbance, psychotic disturbance, mood disturbance, and anxiety: Secondary | ICD-10-CM | POA: Diagnosis not present

## 2021-10-04 DIAGNOSIS — G4733 Obstructive sleep apnea (adult) (pediatric): Secondary | ICD-10-CM | POA: Diagnosis not present

## 2021-10-04 DIAGNOSIS — Z7189 Other specified counseling: Secondary | ICD-10-CM | POA: Diagnosis not present

## 2021-10-04 DIAGNOSIS — R41841 Cognitive communication deficit: Secondary | ICD-10-CM

## 2021-10-04 MED ORDER — ESCITALOPRAM OXALATE 5 MG PO TABS: 5.0000 mg | ORAL_TABLET | Freq: Every day | ORAL | 1 refills | Status: DC

## 2021-10-04 NOTE — Progress Notes (Signed)
? ? ?Chief Complaint  ?Patient presents with  ? Follow-up  ?  RM 17, w wife. Here to f/u for OSA on ASV and dementia. Pt would like to get a new machine. Set up date: 11/13/2016. No change in  memory. Attempted MMSE today, unable to complete.  ? ? ?HISTORY OF PRESENT ILLNESS: ? ?10/04/21 ALL: ?Carlos Pierce returns for follow up for OSA on ASV and dementia. He continues donepezil and memantine. He presents with Mrs Pettengill, who aids in history. Memory seems about the same. He continues to have difficulty with expressive aphasia. She reports that he seems to understand more than he is able to verbalize. He continues to work with [employer] and feels this is helping significantly. He is able to perform more tasks around the home. He does endorse more feelings of sadness. Wife reports that he is more withdrawn. 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 No accidents or episodes of getting lost.  ?He continues to use his work out room daily. He does cardio and weight training for about 30 minutes a day.  ? ?His wife reports that when he uses ASV for at least 6-8 hours he seems much more alert and energized. Some nights he gets up to use restroom. He continues to use 2L of O2 bled into ASV. He is sleeping well most nights. No difficulty with machine or supplies.  ? ? ? ?04/03/2021 ALL:  ?Carlos Pierce returns for follow up for OSA on ASV and memory loss. Memory is stable on Aricept and Namenda. Memory loss progressing. Some days are better than other. PCP sent him for ST. He completed ST with neuro rehab for aphasia 03/14/21. He plans to check back in in January. He does feel that therapy has helped. He is better able to form sentences. Mood is good. No behavioral concerns. He sleeps about 6-9 hours. He recently got a new bed and feels he is sleeping better. He is using ASV nightly. He uses 2L O2 at night. He is using FFM and is happy with the fit. 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 He is still driving. Only a couple of mile to and from work. Wife has not noted any significant difficulty. She feels that he drives safely.  ? ?Set up date 11/13/2016 ? ? ? ? ?03/31/2020 ALL:  ?Carlos Pierce is a 81 y.o. male here today for follow up for OSA on ASV and memory loss. He continues Aricept and Namenda.  ? ?Compliance report dated 02/29/2020 through 03/29/2020 reveals that he used ASV 30 of the past 30 days for compliance of 100%.  He used ASV greater than 4 hours 27 of the past 30 days for compliance of 90%.  Average usage was 7 hours and 16 minutes residual AHI was 2.3 with a EPAP of 15 cm of water, minimum pressure support 3 cm of water and maximum pressure support of 10 cm of water.  There was a leak noted in the 95th percentile at 33 L/min. ? ?HISTORY (copied from my note on 08/27/2019) ? ?Carlos Pierce is a 81 y.o. male here today for follow up for dementia and OSA recently started on ASV. AHI remained elevated on CPAP and titration study showed resolution with ASV with O2 at 1L. He returns today for compliance review.  He reports that he is doing fairly well with therapy.  He does mention concerns of not having  a chinstrap.  When fitted in the office during his titration study, he did well with a small Quatro full facemask and chinstrap.  He is uncertain of what mask he is using now.  He has noted an increased leak when he sleeps on his side.  He had a.  Of time where he fell and was unable to use his machine for about a week.  He reports slipping after stepping down from the fireplace.  He denies any difficulty with recurrent falls or changes in gait. He continues Namenda '10mg'$  BID and Aricept '10mg'$  QHS. He feels that memory is fairly stable. He has good and bad days.  He is able to perform all ADLs independently.  He is driving without difficulty.  He is able to manage finances and take medications independently. ?  ?Compliance report dated  06/27/2019 through 08/25/2019 reveals that he has used ASV therapy 44 of the past 60 days for compliance of 73%.  He has used ASV therapy greater than 4 hours 40 of the past 60 days for compliance of 67%.  Average usage on days used was 6 hours and 11 minutes.  Residual AHI was 4.0 with a EPAP of 15 cm of water, minimum pressure support of 3 cm of water and maximum pressure support of 10 cm of water.  There was a significant leak noted in the 95th percentile of 77.4 L/min. ?  ? ?REVIEW OF SYSTEMS: Out of a complete 14 system review of symptoms, the patient complains only of the following symptoms, memory loss, aphasia, hearing loss and all other reviewed systems are negative. ? ?ESS: 2 ? ?ALLERGIES: ?No Known Allergies ? ? ?HOME MEDICATIONS: ?Outpatient Medications Prior to Visit  ?Medication Sig Dispense Refill  ? ACETYLCYSTEINE PO Take by mouth.    ? BRAN FIBER PO Take by mouth.    ? Coenzyme Q10 (CO Q 10 PO) Take 300 mg by mouth daily.    ? donepezil (ARICEPT) 10 MG tablet Take 1 tablet (10 mg total) by mouth at bedtime. 90 tablet 3  ? ezetimibe (ZETIA) 10 MG tablet Take 1 tablet (10 mg total) by mouth daily. 90 tablet 3  ? famotidine (PEPCID) 20 MG tablet Take 20 mg by mouth daily.     ? Ginkgo Biloba 40 MG TABS Take 240 mg by mouth daily.    ? L-Tyrosine 500 MG CAPS Take 1 capsule by mouth daily.    ? levothyroxine (SYNTHROID) 25 MCG tablet Take 25 mcg by mouth daily before breakfast.    ? memantine (NAMENDA) 10 MG tablet Take 1 tablet (10 mg total) by mouth 2 (two) times daily. 180 tablet 3  ? Multiple Vitamins-Minerals (MULTIVITAMIN & MINERAL PO) Take 1 tablet by mouth daily.    ? omega-3 acid ethyl esters (LOVAZA) 1 g capsule Take 1 capsule (1 g total) by mouth daily. 90 capsule 3  ? ?No facility-administered medications prior to visit.  ? ? ? ?PAST MEDICAL HISTORY: ?Past Medical History:  ?Diagnosis Date  ? Memory loss   ? ? ? ?PAST SURGICAL HISTORY: ?Past Surgical History:  ?Procedure Laterality Date  ?  CATARACT EXTRACTION Bilateral   ? HEMORRHOID BANDING  2016  ? ? ? ?FAMILY HISTORY: ?History reviewed. No pertinent family history. ? ? ?SOCIAL HISTORY: ?Social History  ? ?Socioeconomic History  ? Marital status: Married  ?  Spouse name: [name]  ? Number of children: 1  ? Years of education: 47  ? Highest education level: Not on file  ?  Occupational History  ?  Comment: [employer]  ?Tobacco Use  ? Smoking status: Never  ? Smokeless tobacco: Never  ?Substance and Sexual Activity  ? Alcohol use: Yes  ?  Alcohol/week: 2.0 standard drinks  ?  Types: 2 Standard drinks or equivalent per week  ?  Comment: wine a few days a week  ? Drug use: No  ? Sexual activity: Not on file  ?Other Topics Concern  ? Not on file  ?Social History Narrative  ? Drinks 1 cup of coffee a day   ? ?Social Determinants of Health  ? ?Financial Resource Strain: Not on file  ?Food Insecurity: Not on file  ?Transportation Needs: Not on file  ?Physical Activity: Not on file  ?Stress: Not on file  ?Social Connections: Not on file  ?Intimate Partner Violence: Not on file  ? ? ? ? ?PHYSICAL EXAM ? ?Vitals:  ? 10/04/21 0845  ?BP: 139/73  ?Pulse: 64  ?Weight: 154 lb 9.6 oz (70.1 kg)  ?Height: '5\' 6"'$  (1.676 m)  ? ? ? ?Body mass index is 24.95 kg/m?. ? ? ?Generalized: Well developed, in no acute distress  ? ?Neurological examination  ?Mentation: Alert, he is not oriented to time, but is able to correctly state place and situation. Can assist with most history taking. Follows all commands, expressive aphasia, comprehension intact.   ?Cranial nerve II-XII: Pupils were equal round reactive to light. Extraocular movements were full, visual field were full  ?Motor: The motor testing reveals 5 over 5 strength of all 4 extremities. Good symmetric motor tone is noted throughout.  ?Gait and station: Gait is normal.  ? ? ? ?DIAGNOSTIC DATA (LABS, IMAGING, TESTING) ?- I reviewed patient records, labs, notes, testing and imaging myself where available. ? ?Lab  Results  ?Component Value Date  ? WBC 7.1 08/24/2021  ? HGB 13.7 08/24/2021  ? HCT 40.5 08/24/2021  ? MCV 88 08/24/2021  ? PLT 161 08/24/2021  ? ?   ?Component Value Date/Time  ? NA 143 08/24/2021 0919  ? K 4

## 2021-10-04 NOTE — Therapy (Signed)
[facility], [place], [address] ?Phone: [phone]   Fax:  [phone] ? ?Speech Language Pathology Treatment/Progress Note ? ?Patient Details  ?Name: Carlos Pierce ?MRN: [id number] ?Date of Birth: 18-Sep-1940 ?Referring Provider (SLP): Nickola Major, MD ? ? ?Encounter Date: 10/04/2021 ? ? End of Session - 10/04/21 1008   ? ? Visit Number 20   ? Number of Visits 25   ? Date for SLP Re-Evaluation 11/03/21   ? Authorization Type [employer]   ? Authorization - Visit Number 3   ? Authorization - Number of Visits 7   ? SLP Start Time 1015   ? SLP Stop Time  1100   ? SLP Time Calculation (min) 45 min   ? Activity Tolerance Patient tolerated treatment well   ? ?  ?  ? ?  ? ? ?Past Medical History:  ?Diagnosis Date  ? Memory loss   ? ? ?Past Surgical History:  ?Procedure Laterality Date  ? CATARACT EXTRACTION Bilateral   ? HEMORRHOID BANDING  2016  ? ? ?There were no vitals filed for this visit. ? ? Subjective Assessment - 10/04/21 1016   ? ? Subjective "not a good day"   ? Patient is accompained by: Family member   ? Currently in Pain? No/denies   ? ?  ?  ? ?  ? ? ?Speech Therapy Progress Note ? ?Dates of Reporting Period: 07-31-21 to current ? ?Objective Reports of Subjective Statement: Pt has been seen for 20 ST visits targeting aphasia and cognition related to dementia.  ? ?Objective Measurements: Increased focus on implementing and utilizing external aids and supports due to progressive decline in language and cognition exhibited. Initiated training of Spaced Retrieval to aid carryover of information to aid daily functioning and independence.   ? ?Goal Update: see goals below ? ?Plan: continue per POC ? ?Reason Skilled Services are Required: Pt and wife would continue to benefit from SLP education and instruction to maximize and maintain current cognitive linguistic skills to slow progression of dementia and optimize  patient functional independence.  ? ? ? ? ? ADULT SLP TREATMENT - 10/04/21 1007   ? ?  ? General Information  ? Behavior/Cognition Alert;Pleasant mood;Cooperative;Confused;Requires cueing   ?  ? Treatment Provided  ? Treatment provided Cognitive-Linquistic   ?  ? Cognitive-Linquistic Treatment  ? Treatment focused on Cognition;Aphasia;Patient/family/caregiver education   ? Skilled Treatment Discussed at length prior neurology appointment as it was recommended patient no longer drive based on updated testing. SLP provided educational resources re: driving evaluations. SLP also educated possible impact of declining language and memory on functional safety. SLP educated use of external aids for improved orientation (looking at watch for time/DOW/date - may benefit from digital watch as this was difficult with current analog clock). SLP also focused on Spaced Retrieval for name recall of newest employee. Pt able to recall up to 8 minutes with rare min A. After five minute delay, pt unable to recall. Spaced Retrieval for use of remote was effective for last 6/7 days since last ST session.   ?  ? Assessment / Recommendations / Plan  ? Plan Continue with current plan of care  ?  ? Progression Toward Goals  ? Progression toward goals Progressing toward goals   ? ?  ?  ? ?  ? ? ? SLP Education - 10/04/21 1141   ? ? Education Details driving evaluation, orientation aids   ? Person(s) Educated  Patient;Spouse   ? Methods Explanation;Demonstration   ? Comprehension Verbalized understanding;Returned demonstration;Need further instruction   ? ?  ?  ? ?  ? ? ? SLP Short Term Goals - 07/31/21 0840   ? ?  ? SLP SHORT TERM GOAL #1  ? Title Pt will utilize external memory/communication aids to facilitate naming and recall of personally relevant information given usual mod A over 2 sessions   ? Baseline 07-28-21   ? Status Partially Met   ?  ? SLP SHORT TERM GOAL #2  ? Title Pt will identify and correct phonemic paraphasias/articulation  errors with usual mod A for 2/5 opportunities on structured speech tasks over 2 sessions   ? Baseline 07-07-21, 07-17-21   ? Status Achieved   ?  ? SLP SHORT TERM GOAL #3  ? Title Caregiver will utilize auditory processing/comprehension strategies to aid attention and comprehension in simple conversation given occasional min A over 2 sessions   ? Baseline 07-07-21, 07-21-21   ? Status Achieved   ?  ? SLP SHORT TERM GOAL #4  ? Title Pt will utilize word finding compensations in 5 minute simple conversation with usual mod A  over 2 sessions   ? Baseline 07-13-21   ? Status Partially Met   ?  ? SLP SHORT TERM GOAL #5  ? Title Caregivers will modify work environment (as needed) to compensate for cognitive communication deficits with occasional min A over 2 sessions   ? Status Deferred   ? ?  ?  ? ?  ? ? ? SLP Long Term Goals - 10/04/21 1009   ? ?  ? SLP LONG TERM GOAL #1  ? Title Pt will utilize external memory/communication aids to facilitate naming and recall of personally relevant information given occasional min A over 2 sessions   ? Baseline 08-03-21, 08-07-21   ? Status Achieved   ?  ? SLP LONG TERM GOAL #2  ? Title Pt will identify and correct phonemic paraphasias/articulation errors with usual mod A for 2/4 opportunities over 2 sessions   ? Baseline 08-03-21   ? Time 4   ? Period Weeks   ? Status On-going   ongoing for recert  ?  ? SLP LONG TERM GOAL #3  ? Title Caregiver will utilize auditory processing/comprehension strategies to aid attention and comprehension in simple conversation given rare min A over 2 sessions   ? Baseline 08-07-21, 09-21-21   ? Status Achieved   ?  ? SLP LONG TERM GOAL #4  ? Title Pt will utilize word finding compensations in 10 minute simple conversation with usual mod A over 2 sessions   ? Baseline 09-21-21   ? Time 4   ? Period Weeks   ? Status On-going   ?  ? SLP LONG TERM GOAL #5  ? 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 Time 4   ? Period Weeks   ? Status On-going   ongoing for recert  ?  ? SLP LONG TERM GOAL #6  ? Title Utilizing [employer], pt will recall targeted stimuli with improved accuracy over 3 sessions   ? Baseline 09-27-21   ? Time 4   ? Period Weeks   ? Status On-going   ? ?  ?  ? ?  ? ? ? Plan - 10/04/21 1008   ? ? Clinical Impression  Statement "Carlos Pierce" was referred for OPST intervention to address aphasia and memory deficits related to dementia. SLP re-educated daily use of communication supports to optimize recall and verbal communication of pertinent biographical information and favorite items/places. SLP targeted goals related to communication (names of pertinent employees) and recall of functional tasks (using remote) to target in upcoming ST sessions. Pt would continue to benefit from additional ST services to optimize current cognitive communciation for improved communication effectiveness and to increase QOL.   ? Speech Therapy Frequency 1x /week   ? Duration Other (comment)   1x/week for 6 weeks for recert  ? Treatment/Interventions Compensatory strategies;Cueing hierarchy;Functional tasks;Patient/family education;Cognitive reorganization;Multimodal communcation approach;Language facilitation;Compensatory techniques;Internal/external aids;SLP instruction and feedback   ? Potential to [place]   ? Potential Considerations Ability to learn/carryover information;Previous level of function;Severity of impairments;Medical prognosis   ? SLP Home Exercise Plan provided   ? Consulted and Agree with Plan of Care Patient;Family member/caregiver   ? ?  ?  ? ?  ? ? ?Patient will benefit from skilled therapeutic intervention in order to improve the following deficits and impairments:   ?Aphasia ? ?Cognitive communication deficit ? ? ? ?Problem List ?Patient Active Problem List  ? Diagnosis Date Noted  ? Obstructive sleep  apnea treated with continuous positive airway pressure (CPAP) 01/08/2017  ? Medication management 07/30/2016  ? Slow urinary stream 03/21/2016  ? Hyperlipidemia 06/22/2014  ? Mild HTN 01/16/2013  ? HYPERLIPIDEMIA 02/17/2008

## 2021-10-04 NOTE — Patient Instructions (Addendum)
Below is our plan: ? ?We will continue donepezil '10mg'$  daily and memantine '10mg'$  twice daily. We will start a low dose of escitalopram. Start '5mg'$  daily at bedtime. Please monitor closely for any specific mood or behavioral changes. Let me know immediately if there are any concerns of side effects.  ? ?I recommend that you not dive at this time. Please consider a driving evaluation to determine safety operating a vehicle.  ? ?Please make sure you are staying well hydrated. I recommend 50-60 ounces daily. Well balanced diet and regular exercise encouraged. Consistent sleep schedule with 6-8 hours recommended.  ? ?Please continue follow up with care team as directed.  ? ?Follow up with me in 4 months  ? ?You may receive a survey regarding today's visit. I encourage you to leave honest feed back as I do use this information to improve patient care. Thank you for seeing me today!  ? ? ?

## 2021-10-11 ENCOUNTER — Ambulatory Visit: Payer: Medicare HMO | Attending: Family Medicine

## 2021-10-11 DIAGNOSIS — R41841 Cognitive communication deficit: Secondary | ICD-10-CM | POA: Insufficient documentation

## 2021-10-11 DIAGNOSIS — R4701 Aphasia: Secondary | ICD-10-CM | POA: Insufficient documentation

## 2021-10-11 NOTE — Therapy (Signed)
[facility] ?[address] ?[place], [place], [address] ?Phone: [phone]   Fax:  [phone] ? ?Speech Language Pathology Treatment ? ?Patient Details  ?Name: Carlos Pierce ?MRN: [id number] ?Date of Birth: [birth year] ?Referring Provider (SLP): Nickola Major, MD ? ? ?Encounter Date: 10/11/2021 ? ? End of Session - 10/11/21 1405   ? ? Visit Number 21   ? Number of Visits 25   ? Date for SLP Re-Evaluation 11/03/21   ? Authorization Type [employer]   ? Authorization Time Period 7 SLP visits 08/23/21 - 10/27/21   ? Authorization - Visit Number 4   ? Authorization - Number of Visits 7   ? SLP Start Time 1402   ? SLP Stop Time  1445   ? SLP Time Calculation (min) 43 min   ? Activity Tolerance Patient tolerated treatment well   ? ?  ?  ? ?  ? ? ?Past Medical History:  ?Diagnosis Date  ? Memory loss   ? ? ?Past Surgical History:  ?Procedure Laterality Date  ? CATARACT EXTRACTION Bilateral   ? HEMORRHOID BANDING  2016  ? ? ?There were no vitals filed for this visit. ? ? Subjective Assessment - 10/11/21 1404   ? ? Subjective "nothing new"   ? Patient is accompained by: Family member   ? Currently in Pain? No/denies   ? ?  ?  ? ?  ? ? ? ? ? ? ? ? ADULT SLP TREATMENT - 10/11/21 1404   ? ?  ? General Information  ? Behavior/Cognition Alert;Pleasant mood;Cooperative;Confused;Requires cueing   ?  ? Treatment Provided  ? Treatment provided Cognitive-Linquistic   ?  ? Cognitive-Linquistic Treatment  ? Treatment focused on Cognition;Aphasia;Patient/family/caregiver education   ? Skilled Treatment Increased vague language exhibited today, which required SLP cues to identify and compensate for word retrieval. Pt intermittently benefitted from description strategies and written cues. Successful carryover of Spaced Retrieval for remote usage reported. Spaced Retrieval was less effective for name reall for employee but pt able to be cued to utilize communication  support with good success. Some additional prompting required to aid locating more novel information within book x3.   ?  ? Assessment / Recommendations / Plan  ? Plan Continue with current plan of care   ?  ? Progression Toward Goals  ? Progression toward goals Progressing toward goals   ? ?  ?  ? ?  ? ? ? SLP Education - 10/11/21 1633   ? ? Education Details communication support   ? Person(s) Educated Patient;Spouse   ? Methods Explanation;Demonstration;Verbal cues;Handout   ? Comprehension Verbalized understanding;Returned demonstration;Verbal cues required;Need further instruction   ? ?  ?  ? ?  ? ? ? SLP Short Term Goals - 07/31/21 0840   ? ?  ? SLP SHORT TERM GOAL #1  ? Title Pt will utilize external memory/communication aids to facilitate naming and recall of personally relevant information given usual mod A over 2 sessions   ? Baseline 07-28-21   ? Status Partially Met   ?  ? SLP SHORT TERM GOAL #2  ? Title Pt will identify and correct phonemic paraphasias/articulation errors with usual mod A for 2/5 opportunities on structured speech tasks over 2 sessions   ? Baseline 07-07-21, 07-17-21   ? Status Achieved   ?  ? SLP SHORT TERM GOAL #3  ? Title Caregiver will utilize auditory processing/comprehension strategies to aid attention and comprehension in simple  conversation given occasional min A over 2 sessions   ? Baseline 07-07-21, 07-21-21   ? Status Achieved   ?  ? SLP SHORT TERM GOAL #4  ? Title Pt will utilize word finding compensations in 5 minute simple conversation with usual mod A  over 2 sessions   ? Baseline 07-13-21   ? Status Partially Met   ?  ? SLP SHORT TERM GOAL #5  ? Title Caregivers will modify work environment (as needed) to compensate for cognitive communication deficits with occasional min A over 2 sessions   ? Status Deferred   ? ?  ?  ? ?  ? ? ? SLP Long Term Goals - 10/11/21 1634   ? ?  ? SLP LONG TERM GOAL #1  ? Title Pt will utilize external memory/communication aids to facilitate naming and  recall of personally relevant information given occasional min A over 2 sessions   ? Baseline 08-03-21, 08-07-21   ? Status Achieved   ?  ? SLP LONG TERM GOAL #2  ? Title Pt will identify and correct phonemic paraphasias/articulation errors with usual mod A for 2/4 opportunities over 2 sessions   ? Baseline 08-03-21   ? Time 3   ? Period Weeks   ? Status On-going   ongoing for recert  ?  ? SLP LONG TERM GOAL #3  ? Title Caregiver will utilize auditory processing/comprehension strategies to aid attention and comprehension in simple conversation given rare min A over 2 sessions   ? Baseline 08-07-21, 09-21-21   ? Status Achieved   ?  ? SLP LONG TERM GOAL #4  ? Title Pt will utilize word finding compensations in 10 minute simple conversation with usual mod A over 2 sessions   ? Baseline 09-21-21, 10-11-21   ? Status Achieved   ?  ? SLP LONG TERM GOAL #5  ? Title Pt will successfully continue work at [employer] with family support and external communication/memory aids to optimize communication effectiveness by last ST session   ? Time 3   ? Period Weeks   ? Status On-going   ongoing for recert  ?  ? SLP LONG TERM GOAL #6  ? Title Utilizing [employer], pt will recall targeted stimuli with improved accuracy over 3 sessions   ? Baseline 09-27-21, 10-11-21   ? Time 3   ? Period Weeks   ? Status On-going   ? ?  ?  ? ?  ? ? ? Plan - 10/11/21 1407   ? ? Clinical Impression Statement "[name]" was referred for OPST intervention to address aphasia and memory deficits related to dementia. SLP targeted using communication supports to optimize recall and verbal communication of pertinent biographical information and favorite items/places, which good success and carryover given mild prompting. SLP targeted goals related to communication (names of pertinent employees), with benefit of visual aids. Pt would continue to benefit from additional ST services to optimize current cognitive communciation for improved communication  effectiveness and to increase QOL.   ? Speech Therapy Frequency 1x /week   ? Duration Other (comment)   1x/week for 6 weeks for recert  ? Treatment/Interventions Compensatory strategies;Cueing hierarchy;Functional tasks;Patient/family education;Cognitive reorganization;Multimodal communcation approach;Language facilitation;Compensatory techniques;Internal/external aids;SLP instruction and feedback   ? Potential to [place]   ? Potential Considerations Ability to learn/carryover information;Previous level of function;Severity of impairments;Medical prognosis   ? SLP Home Exercise Plan provided   ? Consulted and Agree with Plan of Care Patient;Family member/caregiver   ? ?  ?  ? ?  ? ? ?  Patient will benefit from skilled therapeutic intervention in order to improve the following deficits and impairments:   ?Aphasia ? ?Cognitive communication deficit ? ? ? ?Problem List ?Patient Active Problem List  ? Diagnosis Date Noted  ? Obstructive sleep apnea treated with continuous positive airway pressure (CPAP) 01/08/2017  ? Medication management 07/30/2016  ? Slow urinary stream 03/21/2016  ? Hyperlipidemia 06/22/2014  ? Mild HTN 01/16/2013  ? HYPERLIPIDEMIA 02/17/2008  ? HEMORRHOIDS-INTERNAL 02/17/2008  ? GERD 02/12/2008  ? ? ?Marzetta Board, CCC-SLP ?10/11/2021, 4:35 PM ? ?[facility] ?[place] ?[place], [place], [address] ?Phone: [phone]   Fax:  [phone] ? ? ?Name: DONNY HEFFERN ?MRN: [id number] ?Date of Birth: Nov 17, 1940 ? ?

## 2021-10-18 ENCOUNTER — Encounter: Payer: Medicare HMO | Admitting: Speech Pathology

## 2021-10-20 ENCOUNTER — Ambulatory Visit: Payer: Medicare HMO

## 2021-10-20 DIAGNOSIS — R4701 Aphasia: Secondary | ICD-10-CM | POA: Diagnosis not present

## 2021-10-20 DIAGNOSIS — R41841 Cognitive communication deficit: Secondary | ICD-10-CM

## 2021-10-20 NOTE — Therapy (Signed)
[facility] ?[place] ?[place], [place], [address] ?Phone: [phone]   Fax:  [phone] ? ?Speech Language Pathology Treatment ? ?Patient Details  ?Name: Carlos Pierce ?MRN: [id number] ?Date of Birth: [birth year] ?Referring Provider (SLP): Nickola Major, MD ? ? ?Encounter Date: 10/20/2021 ? ? End of Session - 10/20/21 1339   ? ? Visit Number 22   ? Number of Visits 25   ? Date for SLP Re-Evaluation 11/03/21   ? Authorization Type [employer]   ? Authorization Time Period 7 SLP visits 08/23/21 - 10/27/21   ? Authorization - Visit Number 6   ? Authorization - Number of Visits 7   ? SLP Start Time 1340   ? SLP Stop Time  1425   ? SLP Time Calculation (min) 45 min   ? Activity Tolerance Patient tolerated treatment well   ? ?  ?  ? ?  ? ? ?Past Medical History:  ?Diagnosis Date  ? Memory loss   ? ? ?Past Surgical History:  ?Procedure Laterality Date  ? CATARACT EXTRACTION Bilateral   ? HEMORRHOID BANDING  2016  ? ? ?There were no vitals filed for this visit. ? ? Subjective Assessment - 10/20/21 1340   ? ? Subjective "I went to the eye doctor"   ? Patient is accompained by: Family member   ? Currently in Pain? No/denies   ? ?  ?  ? ?  ? ? ? ? ? ? ? ? ADULT SLP TREATMENT - 10/20/21 1339   ? ?  ? General Information  ? Behavior/Cognition Alert;Pleasant mood;Cooperative;Confused;Requires cueing   ?  ? Treatment Provided  ? Treatment provided Cognitive-Linquistic   ?  ? Cognitive-Linquistic Treatment  ? Treatment focused on Cognition;Aphasia;Patient/family/caregiver education   ? Skilled Treatment Engaged patient in conversation regarding recent events, in which pt benefited from extra time and semantic cues. Spaced Retrieval technique was effective for recalling how to use remote consistently and employee name occasionally. Pt able to utilize communication support to optimize verbal expression and recall with rare mod A.   ?  ? Assessment /  Recommendations / Plan  ? Plan Continue with current plan of care  ?  ? Progression Toward Goals  ? Progression toward goals Progressing toward goals   ? ?  ?  ? ?  ? ? ? SLP Education - 10/20/21 1426   ? ? Education Details communication support, Licensed conveyancer   ? Person(s) Educated Patient;Spouse   ? Methods Explanation;Demonstration;Handout   ? Comprehension Verbalized understanding;Returned demonstration;Need further instruction   ? ?  ?  ? ?  ? ? ? SLP Short Term Goals - 07/31/21 0840   ? ?  ? SLP SHORT TERM GOAL #1  ? Title Pt will utilize external memory/communication aids to facilitate naming and recall of personally relevant information given usual mod A over 2 sessions   ? Baseline 07-28-21   ? Status Partially Met   ?  ? SLP SHORT TERM GOAL #2  ? Title Pt will identify and correct phonemic paraphasias/articulation errors with usual mod A for 2/5 opportunities on structured speech tasks over 2 sessions   ? Baseline 07-07-21, 07-17-21   ? Status Achieved   ?  ? SLP SHORT TERM GOAL #3  ? Title Caregiver will utilize auditory processing/comprehension strategies to aid attention and comprehension in simple conversation given occasional min A over 2 sessions   ? Baseline 07-07-21, 07-21-21   ? Status Achieved   ?  ?  SLP SHORT TERM GOAL #4  ? Title Pt will utilize word finding compensations in 5 minute simple conversation with usual mod A  over 2 sessions   ? Baseline 07-13-21   ? Status Partially Met   ?  ? SLP SHORT TERM GOAL #5  ? Title Caregivers will modify work environment (as needed) to compensate for cognitive communication deficits with occasional min A over 2 sessions   ? Status Deferred   ? ?  ?  ? ?  ? ? ? SLP Long Term Goals - 10/20/21 1339   ? ?  ? SLP LONG TERM GOAL #1  ? Title Pt will utilize external memory/communication aids to facilitate naming and recall of personally relevant information given occasional min A over 2 sessions   ? Baseline 08-03-21, 08-07-21   ? Status Achieved   ?  ? SLP LONG TERM  GOAL #2  ? Title Pt will identify and correct phonemic paraphasias/articulation errors with usual mod A for 2/4 opportunities over 2 sessions   ? Baseline 08-03-21, 10-20-21   ? Time 2   ? Period Weeks   ? Status Achieved   ?  ? SLP LONG TERM GOAL #3  ? Title Caregiver will utilize auditory processing/comprehension strategies to aid attention and comprehension in simple conversation given rare min A over 2 sessions   ? Baseline 08-07-21, 09-21-21   ? Status Achieved   ?  ? SLP LONG TERM GOAL #4  ? Title Pt will utilize word finding compensations in 10 minute simple conversation with usual mod A over 2 sessions   ? Baseline 09-21-21, 10-11-21   ? Status Achieved   ?  ? SLP LONG TERM GOAL #5  ? Title Pt will successfully continue work at [employer] with family support and external communication/memory aids to optimize communication effectiveness by last ST session   ? Time 2   ? Period Weeks   ? Status On-going   ongoing for recert  ?  ? SLP LONG TERM GOAL #6  ? Title Utilizing [employer], pt will recall targeted stimuli with improved accuracy over 3 sessions   ? Baseline 09-27-21, 10-11-21, 10-20-21   ? Time 2   ? Period Weeks   ? Status Achieved   ? ?  ?  ? ?  ? ? ? Plan - 10/20/21 1339   ? ? Clinical Impression Statement "Carlos Pierce" was referred for OPST intervention to address aphasia and memory deficits related to dementia. SLP targeted using communication supports to optimize recall and verbal communication of pertinent biographical information and favorite items/places, which good success and carryover given rare mild to moderate prompting. SLP targeted goals related to communication (names of pertinent employees), with benefit of visual aids. Pt would continue to benefit from additional ST services to optimize current cognitive communciation for improved communication effectiveness and to increase QOL.   ? Speech Therapy Frequency 1x /week   ? Duration Other (comment)   1x/week for 6 weeks for recert  ?  Treatment/Interventions Compensatory strategies;Cueing hierarchy;Functional tasks;Patient/family education;Cognitive reorganization;Multimodal communcation approach;Language facilitation;Compensatory techniques;Internal/external aids;SLP instruction and feedback   ? Potential to [place]   ? Potential Considerations Ability to learn/carryover information;Previous level of function;Severity of impairments;Medical prognosis   ? SLP Home Exercise Plan provided   ? Consulted and Agree with Plan of Care Patient;Family member/caregiver   ? ?  ?  ? ?  ? ? ?Patient will benefit from skilled therapeutic intervention in order to improve the  following deficits and impairments:   ?Aphasia ? ?Cognitive communication deficit ? ? ? ?Problem List ?Patient Active Problem List  ? Diagnosis Date Noted  ? Obstructive sleep apnea treated with continuous positive airway pressure (CPAP) 01/08/2017  ? Medication management 07/30/2016  ? Slow urinary stream 03/21/2016  ? Hyperlipidemia 06/22/2014  ? Mild HTN 01/16/2013  ? HYPERLIPIDEMIA 02/17/2008  ? HEMORRHOIDS-INTERNAL 02/17/2008  ? GERD 02/12/2008  ? ? ?Marzetta Board, CCC-SLP ?10/20/2021, 2:27 PM ? ?[facility] ?[address] [place] ?[place], [place], [address] ?Phone: [phone]   Fax:  [phone] ? ? ?Name: Carlos Pierce ?MRN: [id number] ?Date of Birth: 1941/02/26 ? ?

## 2021-10-25 ENCOUNTER — Ambulatory Visit: Payer: Medicare HMO

## 2021-10-25 DIAGNOSIS — R4701 Aphasia: Secondary | ICD-10-CM | POA: Diagnosis not present

## 2021-10-25 DIAGNOSIS — R41841 Cognitive communication deficit: Secondary | ICD-10-CM

## 2021-10-25 NOTE — Therapy (Signed)
[facility] ?[place] ?[place], [place], [address] ?Phone: [phone]   Fax:  [phone] ? ?Speech Language Pathology Treatment/Discharge ? ?Patient Details  ?Name: Carlos Pierce ?MRN: [id number] ?Date of Birth: 05-04-1941 ?Referring Provider (SLP): Nickola Major, MD ? ? ?Encounter Date: 10/25/2021 ? ? End of Session - 10/25/21 1347   ? ? Visit Number 23   ? Number of Visits 25   ? Date for SLP Re-Evaluation 11/03/21   ? Authorization Type [employer]   ? Authorization Time Period 7 SLP visits 08/23/21 - 10/27/21   ? Authorization - Visit Number 7   ? Authorization - Number of Visits 7   ? SLP Start Time 1350   ? SLP Stop Time  1435   ? SLP Time Calculation (min) 45 min   ? Activity Tolerance Patient tolerated treatment well   ? ?  ?  ? ?  ? ? ?Past Medical History:  ?Diagnosis Date  ? Memory loss   ? ? ?Past Surgical History:  ?Procedure Laterality Date  ? CATARACT EXTRACTION Bilateral   ? HEMORRHOID BANDING  2016  ? ? ?There were no vitals filed for this visit. ? ? Subjective Assessment - 10/25/21 1351   ? ? Subjective "doing well"   ? Patient is accompained by: Family member   ? Currently in Pain? No/denies   ? ?  ?  ? ?  ? ? ? ?SPEECH THERAPY DISCHARGE SUMMARY ? ?Visits from Start of Care: 23 ? ?Current functional level related to goals / functional outcomes: ?Carlos Pierce demonstrates increasing carryover and implementation of established memory/communication supports to assist with anomia and short term recall deficits. Spaced Retrieval Therapy has been intermittently beneficial to aid recall of functional information and tasks. Pt is pleased with current progress and agreeable to ST discharge this final visit.  ?  ?Remaining deficits: ?Dementia ?  ?Education / Equipment: ?Communication/memory supports, functional activities, caregiver education   ? ?Patient agrees to discharge. Patient goals were met. Patient is being discharged due to  meeting the stated rehab goals.. ? ? ? ? ADULT SLP TREATMENT - 10/25/21 1346   ? ?  ? General Information  ? Behavior/Cognition Alert;Pleasant mood;Cooperative;Confused;Requires cueing   ?  ? Treatment Provided  ? Treatment provided Cognitive-Linquistic   ?  ? Cognitive-Linquistic Treatment  ? Treatment focused on Cognition;Aphasia;Patient/family/caregiver education   ? Skilled Treatment Good carryover and implementation of memory/communication supports with rare to occasional prompting. Verbal expression and recall benefits from use of external aids and Spaced Retrieval due to progression of dementia. Pt and wife are pleased with current progress and agreeable to ST discharge this date.   ?  ? Assessment / Recommendations / Plan  ? Plan Discharge SLP treatment due to (comment)   POC complete  ?  ? Progression Toward Goals  ? Progression toward goals Goals met, education completed, patient discharged from SLP   ? ?  ?  ? ?  ? ? ? SLP Education - 10/25/21 1349   ? ? Education Details discharge summary, recommendations   ? Person(s) Educated Patient;Spouse   ? Methods Explanation;Demonstration   ? Comprehension Verbalized understanding;Returned demonstration   ? ?  ?  ? ?  ? ? ? SLP Short Term Goals - 07/31/21 0840   ? ?  ? SLP SHORT TERM GOAL #1  ? Title Pt will utilize external memory/communication aids to facilitate naming and recall of personally relevant information given usual mod A  over 2 sessions   ? Baseline 07-28-21   ? Status Partially Met   ?  ? SLP SHORT TERM GOAL #2  ? Title Pt will identify and correct phonemic paraphasias/articulation errors with usual mod A for 2/5 opportunities on structured speech tasks over 2 sessions   ? Baseline 07-07-21, 07-17-21   ? Status Achieved   ?  ? SLP SHORT TERM GOAL #3  ? Title Caregiver will utilize auditory processing/comprehension strategies to aid attention and comprehension in simple conversation given occasional min A over 2 sessions   ? Baseline 07-07-21, 07-21-21   ?  Status Achieved   ?  ? SLP SHORT TERM GOAL #4  ? Title Pt will utilize word finding compensations in 5 minute simple conversation with usual mod A  over 2 sessions   ? Baseline 07-13-21   ? Status Partially Met   ?  ? SLP SHORT TERM GOAL #5  ? Title Caregivers will modify work environment (as needed) to compensate for cognitive communication deficits with occasional min A over 2 sessions   ? Status Deferred   ? ?  ?  ? ?  ? ? ? SLP Long Term Goals - 10/25/21 1348   ? ?  ? SLP LONG TERM GOAL #1  ? Title Pt will utilize external memory/communication aids to facilitate naming and recall of personally relevant information given occasional min A over 2 sessions   ? Baseline 08-03-21, 08-07-21   ? Status Achieved   ?  ? SLP LONG TERM GOAL #2  ? Title Pt will identify and correct phonemic paraphasias/articulation errors with usual mod A for 2/4 opportunities over 2 sessions   ? Baseline 08-03-21, 10-20-21   ? Status Achieved   ?  ? SLP LONG TERM GOAL #3  ? Title Caregiver will utilize auditory processing/comprehension strategies to aid attention and comprehension in simple conversation given rare min A over 2 sessions   ? Baseline 08-07-21, 09-21-21   ? Status Achieved   ?  ? SLP LONG TERM GOAL #4  ? Title Pt will utilize word finding compensations in 10 minute simple conversation with usual mod A over 2 sessions   ? Baseline 09-21-21, 10-11-21   ? Status Achieved   ?  ? SLP LONG TERM GOAL #5  ? Title Pt will successfully continue work at [employer] with family support and external communication/memory aids to optimize communication effectiveness by last ST session   ? Status Achieved   ?  ? SLP LONG TERM GOAL #6  ? Title Utilizing [employer], pt will recall targeted stimuli with improved accuracy over 3 sessions   ? Baseline 09-27-21, 10-11-21, 10-20-21   ? Status Achieved   ? ?  ?  ? ?  ? ? ? Plan - 10/25/21 1347   ? ? Clinical Impression Statement "Carlos Pierce" was referred for OPST intervention to address aphasia and memory  deficits related to dementia. SLP educated and trained use of communication/memory supports to optimize recall and verbal communication of pertinent biographical and personally relevant information, in which good success and carryover given rare mild to moderate prompting. Pt made good progress towards ST goals and will be discharged this session as ST goals met.   ? Speech Therapy Frequency 1x /week   ? Duration Other (comment)   1x/week for 6 weeks for recert  ? Treatment/Interventions Compensatory strategies;Cueing hierarchy;Functional tasks;Patient/family education;Cognitive reorganization;Multimodal communcation approach;Language facilitation;Compensatory techniques;Internal/external aids;SLP instruction and feedback   ? Potential to Achieve Goals  [place]   ? Potential Considerations Ability to learn/carryover information;Previous level of function;Severity of impairments;Medical prognosis   ? SLP Home Exercise Plan provided   ? Consulted and Agree with Plan of Care Patient;Family member/caregiver   ? ?  ?  ? ?  ? ? ?Patient will benefit from skilled therapeutic intervention in order to improve the following deficits and impairments:   ?Aphasia ? ?Cognitive communication deficit ? ? ? ?Problem List ?Patient Active Problem List  ? Diagnosis Date Noted  ? Obstructive sleep apnea treated with continuous positive airway pressure (CPAP) 01/08/2017  ? Medication management 07/30/2016  ? Slow urinary stream 03/21/2016  ? Hyperlipidemia 06/22/2014  ? Mild HTN 01/16/2013  ? HYPERLIPIDEMIA 02/17/2008  ? HEMORRHOIDS-INTERNAL 02/17/2008  ? GERD 02/12/2008  ? ? ?Marzetta Board, CCC-SLP ?10/25/2021, 3:15 PM ? ?[facility] ?[place] ?[place], [place], [address] ?Phone: [phone]   Fax:  [phone] ? ? ?Name: KANDEN CAREY ?MRN: [id number] ?Date of Birth: 1941/05/04 ? ?

## 2021-12-05 ENCOUNTER — Other Ambulatory Visit: Payer: Self-pay | Admitting: Otolaryngology

## 2021-12-05 DIAGNOSIS — R1314 Dysphagia, pharyngoesophageal phase: Secondary | ICD-10-CM

## 2021-12-07 ENCOUNTER — Other Ambulatory Visit: Payer: Self-pay | Admitting: Otolaryngology

## 2021-12-07 ENCOUNTER — Ambulatory Visit
Admission: RE | Admit: 2021-12-07 | Discharge: 2021-12-07 | Disposition: A | Discharge status: Home / Self Care | Payer: Medicare HMO | Source: Ambulatory Visit | Attending: Otolaryngology | Admitting: Otolaryngology

## 2021-12-07 ENCOUNTER — Other Ambulatory Visit (HOSPITAL_COMMUNITY): Payer: Self-pay | Admitting: Otolaryngology

## 2021-12-07 DIAGNOSIS — R1314 Dysphagia, pharyngoesophageal phase: Secondary | ICD-10-CM

## 2021-12-13 ENCOUNTER — Ambulatory Visit
Admission: RE | Admit: 2021-12-13 | Discharge: 2021-12-13 | Disposition: A | Discharge status: Home / Self Care | Payer: Medicare HMO | Source: Ambulatory Visit | Attending: Otolaryngology | Admitting: Otolaryngology

## 2021-12-13 DIAGNOSIS — R1314 Dysphagia, pharyngoesophageal phase: Secondary | ICD-10-CM

## 2022-02-01 NOTE — Progress Notes (Addendum)
Chief Complaint  Patient presents with   Follow-up    RM 9, with wife. Last seen 10/04/21. No concerns. No changes. Eligible for new CPAP as of July 2023 (spoke w/ NP about this at last visit). Would like order placed/sen to [facility] for this.    HISTORY OF PRESENT ILLNESS:  02/05/22 ALL: Florence returns for follow up for OSA on ASV and dementia. He was last seen 09/2021 and we added escitalopram. He continued donepezil and memantine. Since, he is doing pretty good. He presents with his wife, today, who aids in history. She reports escitalopram has helped with sadness but she feels it works better with every other day dosing. He seemed to have more difficulty sleeping and questionable hallucinations with '5mg'$  daily. He is sleeping well. He is using ASV nightly but having trouble remembering to restart therapy after using the restroom. Appetite is good. 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 He is planning to return to [place] in October/November. Gait seems a little unsteady at times. No falls and no assistive devices are used.     10/04/2021 ALL: Wolf returns for follow up for OSA on ASV and dementia. He continues donepezil and memantine. He presents with Mrs Carreiro, who aids in history. Memory seems about the same. He continues to have difficulty with expressive aphasia. She reports that he seems to understand more than he is able to verbalize. He continues to work with [facility] and feels this is helping significantly. He is able to perform more tasks around the home. He does endorse more feelings of sadness. Wife reports that he is more withdrawn. 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 No accidents or episodes of getting lost.   He continues to use his work out room daily. He does cardio and weight training for about 30 minutes a day.   His wife reports that when he uses ASV for at least 6-8 hours he seems  much more alert and energized. Some nights he gets up to use restroom. He continues to use 2L of O2 bled into ASV. He is sleeping well most nights. No difficulty with machine or supplies.     04/03/2021 ALL:  Dixon returns for follow up for OSA on ASV and memory loss. Memory is stable on Aricept and Namenda. Memory loss progressing. Some days are better than other. PCP sent him for ST. He completed ST with neuro rehab for aphasia 03/14/21. He plans to check back in in January. He does feel that therapy has helped. He is better able to form sentences. Mood is good. No behavioral concerns. He sleeps about 6-9 hours. He recently got a new bed and feels he is sleeping better. He is using ASV nightly. He uses 2L O2 at night. He is using FFM and is happy with the fit. 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 He is still driving. Only a couple of mile to and from work. Wife has not noted any significant difficulty. She feels that he drives safely.   Set up date 11/13/2016     03/31/2020 ALL:  VOSHON PETRO is a 81 y.o. male here today for follow up for OSA on ASV and memory loss. He continues Aricept and Namenda.   Compliance report dated 02/29/2020 through 03/29/2020 reveals that he used ASV 30 of the past 30 days for compliance of 100%.  He used  ASV greater than 4 hours 27 of the past 30 days for compliance of 90%.  Average usage was 7 hours and 16 minutes residual AHI was 2.3 with a EPAP of 15 cm of water, minimum pressure support 3 cm of water and maximum pressure support of 10 cm of water.  There was a leak noted in the 95th percentile at 33 L/min.  HISTORY (copied from my note on 08/27/2019)  KHYLEN RIOLO is a 81 y.o. male here today for follow up for dementia and OSA recently started on ASV. AHI remained elevated on CPAP and titration study showed resolution with ASV with O2 at 1L. He returns today for compliance review.  He reports that he is doing fairly well  with therapy.  He does mention concerns of not having a chinstrap.  When fitted in the office during his titration study, he did well with a small Quatro full facemask and chinstrap.  He is uncertain of what mask he is using now.  He has noted an increased leak when he sleeps on his side.  He had a.  Of time where he fell and was unable to use his machine for about a week.  He reports slipping after stepping down from the fireplace.  He denies any difficulty with recurrent falls or changes in gait. He continues Namenda '10mg'$  BID and Aricept '10mg'$  QHS. He feels that memory is fairly stable. He has good and bad days.  He is able to perform all ADLs independently.  He is driving without difficulty.  He is able to manage finances and take medications independently.   Compliance report dated 06/27/2019 through 08/25/2019 reveals that he has used ASV therapy 44 of the past 60 days for compliance of 73%.  He has used ASV therapy greater than 4 hours 40 of the past 60 days for compliance of 67%.  Average usage on days used was 6 hours and 11 minutes.  Residual AHI was 4.0 with a EPAP of 15 cm of water, minimum pressure support of 3 cm of water and maximum pressure support of 10 cm of water.  There was a significant leak noted in the 95th percentile of 77.4 L/min.    REVIEW OF SYSTEMS: Out of a complete 14 system review of symptoms, the patient complains only of the following symptoms, memory loss, aphasia, hearing loss and all other reviewed systems are negative.  ESS: 2  ALLERGIES: No Known Allergies   HOME MEDICATIONS: Outpatient Medications Prior to Visit  Medication Sig Dispense Refill   ACETYLCYSTEINE PO Take by mouth.     BRAN FIBER PO Take by mouth.     Coenzyme Q10 (CO Q 10 PO) Take 300 mg by mouth daily.     donepezil (ARICEPT) 10 MG tablet Take 1 tablet (10 mg total) by mouth at bedtime. 90 tablet 3   escitalopram (LEXAPRO) 5 MG tablet Take 1 tablet (5 mg total) by mouth daily. 180 tablet 1    ezetimibe (ZETIA) 10 MG tablet Take 1 tablet (10 mg total) by mouth daily. 90 tablet 3   famotidine (PEPCID) 20 MG tablet Take 20 mg by mouth daily.      Ginkgo Biloba 40 MG TABS Take 240 mg by mouth daily.     L-Tyrosine 500 MG CAPS Take 1 capsule by mouth daily.     levothyroxine (SYNTHROID) 25 MCG tablet Take 25 mcg by mouth daily before breakfast.     memantine (NAMENDA) 10 MG tablet Take 1 tablet (10  mg total) by mouth 2 (two) times daily. 180 tablet 3   Multiple Vitamins-Minerals (MULTIVITAMIN & MINERAL PO) Take 1 tablet by mouth daily.     omega-3 acid ethyl esters (LOVAZA) 1 g capsule Take 1 capsule (1 g total) by mouth daily. 90 capsule 3   No facility-administered medications prior to visit.     PAST MEDICAL HISTORY: Past Medical History:  Diagnosis Date   Memory loss      PAST SURGICAL HISTORY: Past Surgical History:  Procedure Laterality Date   CATARACT EXTRACTION Bilateral    HEMORRHOID BANDING  2016     FAMILY HISTORY: No family history on file.   SOCIAL HISTORY: Social History   Socioeconomic History   Marital status: Married    Spouse name: [name]   Number of children: 1   Years of education: 14   Highest education level: Not on file  Occupational History    Comment:  [employer]  Tobacco Use   Smoking status: Never   Smokeless tobacco: Never  Substance and Sexual Activity   Alcohol use: Yes    Alcohol/week: 2.0 standard drinks of alcohol    Types: 2 Standard drinks or equivalent per week    Comment: wine a few days a week   Drug use: No   Sexual activity: Not on file  Other Topics Concern   Not on file  Social History Narrative   Drinks 1 cup of coffee a day    Social Determinants of Health   Financial Resource Strain: Not on file  Food Insecurity: Not on file  Transportation Needs: Not on file  Physical Activity: Not on file  Stress: Not on file  Social Connections: Not on file  Intimate Partner Violence: Not on file       PHYSICAL EXAM  Vitals:   02/05/22 0949  BP: (!) 142/75  Pulse: (!) 59  Weight: 156 lb (70.8 kg)  Height: '5\' 6"'$  (1.676 m)      Body mass index is 25.18 kg/m.   Generalized: Well developed, in no acute distress   Neurological examination  Mentation: Alert, he is not oriented to time, but is able to correctly state place and situation. Can assist with most history taking. Follows all commands, expressive aphasia, comprehension intact.   Cranial nerve II-XII: Pupils were equal round reactive to light. Extraocular movements were full, visual field were full  Motor: The motor testing reveals 5 over 5 strength of all 4 extremities. Good symmetric motor tone is noted throughout.  Gait and station: Able to push to standing position without assistance. Gait is slightly narrow and cautious but appears stable without assistive device. He is unable to tandem.     DIAGNOSTIC DATA (LABS, IMAGING, TESTING) - I reviewed patient records, labs, notes, testing and imaging myself where available.  Lab Results  Component Value Date   WBC 7.1 08/24/2021   HGB 13.7 08/24/2021   HCT 40.5 08/24/2021   MCV 88 08/24/2021   PLT 161 08/24/2021      Component Value Date/Time   NA 143 08/24/2021 0919   K 4.6 08/24/2021 0919   CL 107 (H) 08/24/2021 0919   CO2 22 08/24/2021 0919   GLUCOSE 100 (H) 08/24/2021 0919   GLUCOSE 103 (H) 06/29/2016 1021   BUN 22 08/24/2021 0919   CREATININE 1.34 (H) 08/24/2021 0919   CREATININE 1.17 06/29/2016 1021   CALCIUM 9.7 08/24/2021 0919   PROT 6.7 08/24/2021 0919   ALBUMIN 4.4 08/24/2021 0919  AST 27 08/24/2021 0919   ALT 41 08/24/2021 0919   ALKPHOS 61 08/24/2021 0919   BILITOT 0.4 08/24/2021 0919   GFRNONAA 64 07/11/2020 0857   GFRAA 74 07/11/2020 0857   Lab Results  Component Value Date   CHOL 196 08/24/2021   HDL 60 08/24/2021   LDLCALC 115 (H) 08/24/2021   TRIG 116 08/24/2021   CHOLHDL 3.3 08/24/2021   No results found for:  "HGBA1C" Lab Results  Component Value Date   VITAMINB12 815 07/23/2016   Lab Results  Component Value Date   TSH 7.360 (H) 08/22/2020      10/04/2021   12:00 PM 04/03/2021    9:11 AM 03/31/2020    9:39 AM  MMSE - Mini Mental State Exam  Not completed: Unable to complete    Orientation to time  0 2  Orientation to Place  1 2  Registration  2 3  Attention/ Calculation  0 4  Attention/Calculation-comments   did WORLD )spelled DLOW  Recall  0 2  Language- name 2 objects  2 2  Language- repeat  0 0  Language- follow 3 step command  3 3  Language- read & follow direction  1 1  Write a sentence  1 1  Copy design  0 0  Total score  10 20     ASSESSMENT AND PLAN  81 y.o. year old male  has a past medical history of Memory loss. here with   Moderate dementia, unspecified dementia type, unspecified whether behavioral, psychotic, or mood disturbance or anxiety (HCC)  ASV (adaptive servo-ventilation) use counseling  OSA (obstructive sleep apnea)  Aphasia  Carron is doing well, today.  Compliance report reveals excellent daily but sup opitmal 4 hour compliance.  Leak has improved. Memory loss continues to progress. Unable to perform MMSE due to aphasia and anxiety, previously 10/30. He will continue Aricept and Namenda as prescribed. He will continue escitalopram '5mg'$  every other day at bedtime. Potential side effects reviewed. No SI. He is not driving. Memory compensation strategies encouraged.  He was encouraged to use ASV nightly for at least 4 hours. He will follow-up with [place] in 6-12 months, sooner if needed.  He verbalizes understanding and agreement with this plan.   Debbora Presto, MSN, FNP-C 02/05/2022, 10:00 AM  [facility] [address], [place], [place] [address] [phone]

## 2022-02-01 NOTE — Patient Instructions (Addendum)
Below is our plan:  We will continue donepezil '10mg'$  daily, memantine '10mg'$  twice daily and escitalopram '5mg'$  every other day.   We will discuss sleep study with Dr [name] and plan to send new orders for ASV asap.   Please consider using a stability device like cane or walker if you feel gait is not steady.   Please continue using your ASV regularly. While your insurance requires that you use ASV at least 4 hours each night on 70% of the nights, I recommend, that you not skip any nights and use it throughout the night if you can. Getting used to ASV and staying with the treatment long term does take time and patience and discipline. Untreated obstructive sleep apnea when it is moderate to severe can have an adverse impact on cardiovascular health and raise her risk for heart disease, arrhythmias, hypertension, congestive heart failure, stroke and diabetes. Untreated obstructive sleep apnea causes sleep disruption, nonrestorative sleep, and sleep deprivation. This can have an impact on your day to day functioning and cause daytime sleepiness and impairment of cognitive function, memory loss, mood disturbance, and problems focussing. Using ASV regularly can improve these symptoms.  Please make sure you are staying well hydrated. I recommend 50-60 ounces daily. Well balanced diet and regular exercise encouraged. Consistent sleep schedule with 6-8 hours recommended.   Please continue follow up with care team as directed.   Follow up with me in 61-90 days following set up of new ASV machine   You may receive a survey regarding today's visit. I encourage you to leave honest feed back as I do use this information to improve patient care. Thank you for seeing me today!   Management of Memory Problems   There are some general things you can do to help manage your memory problems.  Your memory may not in fact recover, but by using techniques and strategies you will be able to manage your memory difficulties  better.   1)  Establish a routine. Try to establish and then stick to a regular routine.  By doing this, you will get used to what to expect and you will reduce the need to rely on your memory.  Also, try to do things at the same time of day, such as taking your medication or checking your calendar first thing in the morning. Think about think that you can do as a part of a regular routine and make a list.  Then enter them into a daily planner to remind you.  This will help you establish a routine.   2)  Organize your environment. Organize your environment so that it is uncluttered.  Decrease visual stimulation.  Place everyday items such as keys or cell phone in the same place every day (ie.  Basket next to front door) Use post it notes with a brief message to yourself (ie. Turn off light, lock the door) Use labels to indicate where things go (ie. Which cupboards are for food, dishes, etc.) Keep a notepad and pen by the telephone to take messages   3)  Memory Aids A diary or journal/notebook/daily planner Making a list (shopping list, chore list, to do list that needs to be done) Using an alarm as a reminder (kitchen timer or cell phone alarm) Using cell phone to store information (Notes, Calendar, Reminders) Calendar/White board placed in a prominent position Post-it notes   In order for memory aids to be useful, you need to have good habits.  It's no good  remembering to make a note in your journal if you don't remember to look in it.  Try setting aside a certain time of day to look in journal.   4)  Improving mood and managing fatigue. There may be other factors that contribute to memory difficulties.  Factors, such as anxiety, depression and tiredness can affect memory. Regular gentle exercise can help improve your mood and give you more energy. Simple relaxation techniques may help relieve symptoms of anxiety Try to get back to completing activities or hobbies you enjoyed doing in the  past. Learn to pace yourself through activities to decrease fatigue. Find out about some local support groups where you can share experiences with others. Try and achieve 7-8 hours of sleep at night.

## 2022-02-05 ENCOUNTER — Ambulatory Visit: Payer: Medicare HMO | Admitting: Family Medicine

## 2022-02-05 ENCOUNTER — Encounter: Payer: Self-pay | Admitting: Family Medicine

## 2022-02-05 VITALS — BP 142/75 | HR 59 | Ht 66.0 in | Wt 156.0 lb

## 2022-02-05 DIAGNOSIS — F03B0 Unspecified dementia, moderate, without behavioral disturbance, psychotic disturbance, mood disturbance, and anxiety: Secondary | ICD-10-CM

## 2022-02-05 DIAGNOSIS — R4701 Aphasia: Secondary | ICD-10-CM | POA: Diagnosis not present

## 2022-02-05 DIAGNOSIS — G4733 Obstructive sleep apnea (adult) (pediatric): Secondary | ICD-10-CM

## 2022-02-05 DIAGNOSIS — Z7189 Other specified counseling: Secondary | ICD-10-CM

## 2022-02-13 ENCOUNTER — Other Ambulatory Visit: Payer: Self-pay

## 2022-02-13 DIAGNOSIS — R413 Other amnesia: Secondary | ICD-10-CM

## 2022-02-13 MED ORDER — MEMANTINE HCL 10 MG PO TABS: 10.0000 mg | ORAL_TABLET | Freq: Two times a day (BID) | ORAL | 3 refills | Status: DC

## 2022-02-13 MED ORDER — DONEPEZIL HCL 10 MG PO TABS: 10.0000 mg | ORAL_TABLET | Freq: Every day | ORAL | 3 refills | Status: DC

## 2022-04-13 ENCOUNTER — Encounter: Payer: Self-pay | Admitting: Cardiovascular Disease

## 2022-06-16 ENCOUNTER — Encounter: Payer: Self-pay | Admitting: Family Medicine

## 2022-06-19 ENCOUNTER — Telehealth: Payer: Self-pay

## 2022-06-19 NOTE — Telephone Encounter (Signed)
I called patient. I spoke with patient's wife, [name], per DPR. She reports that patient's gait has worsened. Speech has worsened. They have been unable to get an appointment with Amy, NP for the 4 month follow up. I offered an appointment with Dr. [name] for 06/20/22 at 10:00am which she accepted. Pt's wife verbalized understanding of new appt date and time.

## 2022-06-20 ENCOUNTER — Ambulatory Visit: Payer: Medicare HMO | Admitting: Diagnostic Neuroimaging

## 2022-06-20 ENCOUNTER — Encounter: Payer: Self-pay | Admitting: Diagnostic Neuroimaging

## 2022-06-20 VITALS — BP 140/89 | HR 69 | Ht 66.0 in | Wt 161.4 lb

## 2022-06-20 DIAGNOSIS — F03B3 Unspecified dementia, moderate, with mood disturbance: Secondary | ICD-10-CM | POA: Diagnosis not present

## 2022-06-20 DIAGNOSIS — R413 Other amnesia: Secondary | ICD-10-CM

## 2022-06-20 MED ORDER — ESCITALOPRAM OXALATE 5 MG PO TABS
5.0000 mg | ORAL_TABLET | ORAL | 4 refills | Status: DC
Start: 2022-06-20 — End: 2022-12-20

## 2022-06-20 MED ORDER — MEMANTINE HCL 10 MG PO TABS: 10.0000 mg | ORAL_TABLET | Freq: Two times a day (BID) | ORAL | 3 refills | Status: DC

## 2022-06-20 NOTE — Patient Instructions (Addendum)
Moderate dementia (now with progression; also diff with swallowing, language and gait) - continue memantine; stop donepezil (lack of benefit; possible GI side effects; simplify regimen) - continue escitalopram '5mg'$  every other day (better than daily; which caused nightmares) - may consider to wean off other supplements and memantine in future (to simplify regimen) - safety / supervision issues reviewed - daily physical activity / exercise (at least 15-30 minutes) - eat more plants / vegetables - increase social activities, brain stimulation, games, puzzles, hobbies, crafts, arts, music - aim for at least 7-8 hours sleep per night (or more) - avoid smoking and alcohol - caregiver resources provided - cannot manage medications or finances; no driving  OSA - on CPAP for now; but getting harder to maintain due to confusion / dementia; may try weaning off if too difficulty to maintain compliance

## 2022-06-20 NOTE — Progress Notes (Signed)
[facility]   PATIENT: Carlos Pierce DOB: 1940/10/23  REFERRING CLINICIAN: Nickola Major, MD  HISTORY FROM: patient and wife  REASON FOR VISIT: follow up    HISTORICAL  CHIEF COMPLAINT:  Chief Complaint  Patient presents with   Follow-up    Patient in room #6 with his wife. Pt here today to f/u on his worsening gait. Patient wife state he has had trouble walk steady and walking up the stairs. Pt wife state he having trouble swollen and chewing. Pt wife state he has a lot of couching while eating.    HISTORY OF PRESENT ILLNESS:   UPDATE (06/20/22, VRP): Since last visit, continued progression of dementia. Now with swallow and gait diff. More diff taking pills, walking and balancing. More diff using CPAP (has stopped using in the last month).  UPDATE (11/26/17, VRP): Since last visit, doing well. Tolerating memantine. No alleviating or aggravating factors. Feels like memory is slightly better. ADLs stable.   UPDATE (05/23/17, VRP): Since last visit, doing well per patient. Wife thinks there has been mild decline over last year, esp slower with conversations and losing train of thought and driving directions. No alleviating or aggravating factors. Eating well. No major change in ADLs. He stopped BP meds and chol meds to see if memory loss would improve, but he is not sure if there has been any change.   UPDATE 10/29/16: Since last visit, doing well. Memory loss stable. Now trying to stay calm when he forgets, and then the name or thought returns. Also with mild OSA, planning to start CPAP next month.   PRIOR HPI (07/23/16): 82 year old right-handed male here for evaluation of memory loss. Patient reports 1-2 months of mild short-term memory loss, losing track of his train of thought, word finding difficulties, difficulty with name recall, forgetting how to stay on task. 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 Otherwise he is able to maintain all of  his activities of daily living. He takes care of paying the bills and finances at home. He does light chores around the house. Patient's wife reports at least one year of duration of the symptoms. She also notes that he is having more trouble with driving directions, recall of people's names, telling a story and then getting off track at the end. Patient is otherwise very active physically. He exercises 4-5 times per week.   REVIEW OF SYSTEMS: Full 14 system review of systems performed and negative with exception of: sleep apnea snoring hearing loss.    ALLERGIES: No Known Allergies  HOME MEDICATIONS: Outpatient Medications Prior to Visit  Medication Sig Dispense Refill   pantoprazole (PROTONIX) 40 MG tablet Take 40 mg by mouth daily.     ACETYLCYSTEINE PO Take by mouth.     BRAN FIBER PO Take by mouth.     Coenzyme Q10 (CO Q 10 PO) Take 300 mg by mouth daily.     donepezil (ARICEPT) 10 MG tablet Take 1 tablet (10 mg total) by mouth at bedtime. 90 tablet 3   escitalopram (LEXAPRO) 5 MG tablet Take 1 tablet (5 mg total) by mouth daily. 180 tablet 1   ezetimibe (ZETIA) 10 MG tablet Take 1 tablet (10 mg total) by mouth daily. 90 tablet 3   famotidine (PEPCID) 20 MG tablet Take 20 mg by mouth daily.      Ginkgo Biloba 40 MG TABS Take 240 mg by mouth daily.     L-Tyrosine 500 MG CAPS  Take 1 capsule by mouth daily.     levothyroxine (SYNTHROID) 25 MCG tablet Take 25 mcg by mouth daily before breakfast.     memantine (NAMENDA) 10 MG tablet Take 1 tablet (10 mg total) by mouth 2 (two) times daily. 180 tablet 3   Multiple Vitamins-Minerals (MULTIVITAMIN & MINERAL PO) Take 1 tablet by mouth daily.     omega-3 acid ethyl esters (LOVAZA) 1 g capsule Take 1 capsule (1 g total) by mouth daily. 90 capsule 3   No facility-administered medications prior to visit.    PAST MEDICAL HISTORY: Past Medical History:  Diagnosis Date   Memory loss     PAST SURGICAL HISTORY: Past Surgical History:   Procedure Laterality Date   CATARACT EXTRACTION Bilateral    HEMORRHOID BANDING  2016    FAMILY HISTORY: History reviewed. No pertinent family history.  SOCIAL HISTORY:  Social History   Socioeconomic History   Marital status: Married    Spouse name: [name]   Number of children: 1   Years of education: 14   Highest education level: Not on file  Occupational History    Comment: [employer]  Tobacco Use   Smoking status: Never   Smokeless tobacco: Never  Substance and Sexual Activity   Alcohol use: Yes    Alcohol/week: 2.0 standard drinks of alcohol    Types: 2 Standard drinks or equivalent per week    Comment: wine a few days a week   Drug use: No   Sexual activity: Not on file  Other Topics Concern   Not on file  Social History Narrative   Drinks 1 cup of coffee a day    Social Determinants of Health   Financial Resource Strain: Not on file  Food Insecurity: Not on file  Transportation Needs: Not on file  Physical Activity: Not on file  Stress: Not on file  Social Connections: Not on file  Intimate Partner Violence: Not on file     PHYSICAL EXAM  GENERAL EXAM/CONSTITUTIONAL: Vitals:  Vitals:   06/20/22 0952  BP: (!) 140/89  Pulse: 69  Weight: 161 lb 6.4 oz (73.2 kg)  Height: '5\' 6"'$  (1.676 m)   Body mass index is 26.05 kg/m. No results found. Patient is in no distress; well developed, nourished and groomed; neck is supple  CARDIOVASCULAR: Examination of carotid arteries is normal; no carotid bruits Regular rate and rhythm, no murmurs Examination of peripheral vascular system by observation and palpation is normal  EYES: Ophthalmoscopic exam of optic discs and posterior segments is normal; no papilledema or hemorrhages  MUSCULOSKELETAL: Gait, strength, tone, movements noted in Neurologic exam below  NEUROLOGIC: MENTAL STATUS:     10/04/2021   12:00 PM 04/03/2021    9:11 AM 03/31/2020    9:39 AM  MMSE - Mini Mental State Exam   Not completed: Unable to complete    Orientation to time  0 2  Orientation to Place  1 2  Registration  2 3  Attention/ Calculation  0 4  Attention/Calculation-comments   did WORLD )spelled DLOW  Recall  0 2  Language- name 2 objects  2 2  Language- repeat  0 0  Language- follow 3 step command  3 3  Language- read & follow direction  1 1  Write a sentence  1 1  Copy design  0 0  Total score  10 20   awake, alert, oriented to person Decr memory  Decr attention and concentration Decr fluency; slow  comprehension; mild apraxia  CRANIAL NERVE:  2nd, 3rd, 4th, 6th - pupils equal and reactive to light, visual fields full to confrontation, extraocular muscles intact, no nystagmus 5th - facial sensation symmetric 7th - facial strength symmetric 8th - hearing intact 9th - palate elevates symmetrically, uvula midline 11th - shoulder shrug symmetric 12th - tongue protrusion midline  MOTOR:  normal bulk and tone, full strength in the BUE, BLE  SENSORY:  normal and symmetric to light touch, temperature, vibration  COORDINATION:  finger-nose-finger, fine finger movements normal  REFLEXES:  deep tendon reflexes TRACE and symmetric  GAIT/STATION:  narrow based gait; STOOPED POSTURE; SLIGHTLY SLOW AND CAUTIOUS    DIAGNOSTIC DATA (LABS, IMAGING, TESTING) - I reviewed patient records, labs, notes, testing and imaging myself where available.  Lab Results  Component Value Date   WBC 7.1 08/24/2021   HGB 13.7 08/24/2021   HCT 40.5 08/24/2021   MCV 88 08/24/2021   PLT 161 08/24/2021      Component Value Date/Time   NA 143 08/24/2021 0919   K 4.6 08/24/2021 0919   CL 107 (H) 08/24/2021 0919   CO2 22 08/24/2021 0919   GLUCOSE 100 (H) 08/24/2021 0919   GLUCOSE 103 (H) 06/29/2016 1021   BUN 22 08/24/2021 0919   CREATININE 1.34 (H) 08/24/2021 0919   CREATININE 1.17 06/29/2016 1021   CALCIUM 9.7 08/24/2021 0919   PROT 6.7 08/24/2021 0919   ALBUMIN 4.4 08/24/2021 0919   AST  27 08/24/2021 0919   ALT 41 08/24/2021 0919   ALKPHOS 61 08/24/2021 0919   BILITOT 0.4 08/24/2021 0919   GFRNONAA 64 07/11/2020 0857   GFRAA 74 07/11/2020 0857   Lab Results  Component Value Date   CHOL 196 08/24/2021   HDL 60 08/24/2021   LDLCALC 115 (H) 08/24/2021   TRIG 116 08/24/2021   CHOLHDL 3.3 08/24/2021   No results found for: "HGBA1C" Lab Results  Component Value Date   VITAMINB12 815 07/23/2016   Lab Results  Component Value Date   TSH 7.360 (H) 08/22/2020    10/13/14 MRI brain [I reviewed images myself and agree with interpretation. -VRP]  1. No acute or focal lesion to explain left-sided hearing loss. 2. Right mastoid effusion. No obstructing nasopharyngeal lesion is evident.  08/07/16 MRI brain [I reviewed images myself and agree with interpretation. -VRP]  - showing mild changes of chronic microvascular ischemia, generalized cerebral atrophy and mild changes of chronic paranasal sinus inflammation. Overall no significant change compared with previous MRI scan dated 10/13/2014.  09/02/16 PSG 1. This study demonstrates overall mild obstructive sleep apnea, with a total AHI of 7.2/hour, REM AHI of 12.7/hour, and O2 nadir of 78%. Given the patient's medical history and sleep related complaints, as well as desaturations into the 80s and as low as 78%, treatment with positive airway pressure in the form of CPAP is recommended; this will require a full night titration study to optimize therapy.  2. Moderate PLMs (periodic limb movements of sleep) were noted during the study with minimalarousals; clinical correlation is recommended. 3. This study shows sleep fragmentation and abnormal sleep stage percentages; these are nonspecificfindings and per se do not signify an intrinsic sleep disorder or a cause for the patient's sleep-related symptoms. Causes include (but are not limited to) the first night effect of the sleep study,circadian rhythm disturbances, medication effect or an  underlying mood disorder or medical problem.   10/07/16 CPAP titration  - This study demonstrates near complete resolution of the patient's  obstructive sleep apnea with CPAP therapy.    ASSESSMENT AND PLAN  82 y.o. year old male here with mild memory loss since 2017, mainly short term, recall, names, directions.   Dx:  1. Moderate dementia with mood disturbance, unspecified dementia type ([place])     PLAN:  Moderate dementia (now with progression; also diff with swallowing, language and gait) - continue memantine; stop donepezil (lack of benefit; possible GI side effects; simplify regimen) - continue escitalopram '5mg'$  every other day (better than daily; which caused nightmares) - may consider to wean off other supplements and memantine in future (to simplify regimen) - safety / supervision issues reviewed - daily physical activity / exercise (at least 15-30 minutes) - eat more plants / vegetables - increase social activities, brain stimulation, games, puzzles, hobbies, crafts, arts, music - aim for at least 7-8 hours sleep per night (or more) - avoid smoking and alcohol - caregiver resources provided - cannot manage medications or finances; no driving  OSA - on CPAP for now; but getting harder to maintain due to confusion / dementia; may try weaning off if too difficulty to maintain compliance  Meds ordered this encounter  Medications   memantine (NAMENDA) 10 MG tablet    Sig: Take 1 tablet (10 mg total) by mouth 2 (two) times daily.    Dispense:  180 tablet    Refill:  3   Return in about 6 months (around 12/19/2022) for with NP (Amy Lomax).    Penni Bombard, MD 0/17/4944, 96:75 AM Certified in Neurology, Neurophysiology and Neuroimaging  [facility] [address], [place], [place] [address] [phone]

## 2022-09-06 ENCOUNTER — Ambulatory Visit: Payer: Medicare HMO | Attending: Cardiovascular Disease | Admitting: Cardiovascular Disease

## 2022-09-06 ENCOUNTER — Encounter: Payer: Self-pay | Admitting: Cardiovascular Disease

## 2022-09-06 VITALS — BP 130/82 | HR 71 | Ht 66.0 in | Wt 161.0 lb

## 2022-09-06 DIAGNOSIS — E039 Hypothyroidism, unspecified: Secondary | ICD-10-CM

## 2022-09-06 DIAGNOSIS — E782 Mixed hyperlipidemia: Secondary | ICD-10-CM | POA: Diagnosis not present

## 2022-09-06 DIAGNOSIS — I10 Essential (primary) hypertension: Secondary | ICD-10-CM | POA: Diagnosis not present

## 2022-09-06 DIAGNOSIS — G4733 Obstructive sleep apnea (adult) (pediatric): Secondary | ICD-10-CM | POA: Diagnosis not present

## 2022-09-06 DIAGNOSIS — F03B3 Unspecified dementia, moderate, with mood disturbance: Secondary | ICD-10-CM

## 2022-09-06 NOTE — Progress Notes (Signed)
Patient ID: KHYRIN MABERY, male   DOB: 20-Dec-1940, 82 y.o.   MRN: [id number]       HPI: JAXEN MCATEER is a 82 y.o. male who presents to the office today for a 87 month cardiology evaluation.  Mr. Raynor  has a history of mild hypertension and hyperlipidemia. Remotely, he has had mild elevation of PSAs which have been followed by Drs [name] and more recently Dr. [name].  He has had mild transaminase elevation in the past on Crestor.  In 2013 laboratory revealed  a total cholesterol 213 triglycerides 136 HDL 46 LDL 140. PSA was 3.19. Normal LFTs.  He has tolerated  Livalo 2 mg in addition to Zetia 10 mg. Repeat blood work on 01/12/2013 showed a total cholesterol 144 HDL 55 LDL 70 VLDL 19 triglycerides 96. He is tolerating the medicine well without side effects. Liver function studies have remained normal; PSA  decreased at 2.61. He remains active and exercises daily. Over the past 6 years he has lost 15-20 pounds as a result of his activity and improve diet.  He has continued to exercise at least  5 days per week.  He exercises on a stationary bike for over an hour for approximately 18 miles 3 days per week and on alternating days does low resistance training.  He had undergone a colonoscopy by Dr. [name].He denies chest pain PND or orthopnea. He denies palpitations. He denies myalgias.  He had experienced one episode where he said up very abruptly and did have transient dizziness where he stumbled.  He has not had recurrence of this.  He denied associated palpitations.  He denied vertigo.  In December 2016 lipid studies were excellent with a total cholesterol 141, triglycerides 96, HDL 47 and LDL 75.  PSA was 3.54.   In 06/29/2016  CBC was normal.  PSA was 2.7.  BUN27, creatinine 1.17.  LFTs were normal.  Lipid studies continue to be excellent with a total cholesterol of 146, triglycerides 1:30, HDL 51, and LDL 69.   Since I saw him in January 2018, he has had memory  issues.  He also had some development of lower back discomfort.  He held off exercise for approximately 2 months but is now back exercising again.  He denies chest pain or shortness of breath.  He has now required hearing aids.  He underwent a sleep evaluation by the neurologist and was found to have mild sleep apnea.  Repeat blood pressure was 118/64 he uses CPAP for obstructive sleep apnea and admits to 100% compliance.  Laboratory in January 2019 showed a total cholesterol 173, HDL 56, LDL 94 and triglycerides 130.  He was Zetia and pravastatin 40 mg.  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  He continues to be on lisinopril 5 mg for hypertension.  GERD is controlled with Zantac.  Due to concerns for memory issues and statins he ultimately was advised to discontinue pravastatin by neurology.  Since I saw him in April 2019, he has been followed by Dr. [name] and is now donepezil 10 mg at bedtime in addition to memantine 10 mg twice a day.  His wife was wondering about potential additional evaluation elsewhere.  According to the patient not had any major improvement in memory but perhaps there is not been as rapid deterioration as there had in the past.  At times his wife has noticed that it is difficult for him to get words out.  The  patient denies any chest tightness or pressure.  He denied palpitations, presyncope or syncope.    I saw him on March 2020.  Laboratory on August 12, 2018  showed mild glucose elevation at 106.  Renal function was improved with a creatinine of 1.26.  Fair to 5 months ago lipid studies are significantly improved with total cholesterol improving from 261 to 175, triglycerides 155 to63, and LDL cholesterol from 180 to 110.  He continues to take Zetia.  He also has been taking his son's lovaza generic omega 3 fatty acid.  In retrospect, he does not believe the pravastatin contributed to any of his memory issues.    In 2020 He contracted  COVID-19 from his son and had symptoms of fever for several days, cough, loss of taste and smell.  He had taken some of his wife's doxycycline days and he believes this may have ameliorated some of his symptoms.  However he did note significant residual fatigability.  He admits to fatigue.  He continues to have difficulty losing his words and recently saw his neurologist who scheduled him for repeat sleep study which is scheduled for October 13.  He denies chest pain.  He denies palpitations.  He is not taking rosuvastatin but is taking Zetia 10 mg as well as lisinopril 5 mg daily and lovasa 1 capsule daily.    I evaluated him in a telemedicine visit in April 2021.  He had undergone a sleep study by Dr. [name] and initially was tried on BiPAP therapy.  He was ultimately switched to ASV therapy with an EPAP of 15, pressure support minimum of 3 maximum of 10 with 1 L of supplemental oxygen.  He was continuing to have some memory difficulty and continues to be on Namenda and Aricept.  I saw him on July 18, 2020.  Since his prior evaluation he had undergone resection of a basal cell cancer on his nose.  He uses hearing aids for difficulty hearing.  He continues to use ASV therapy.  He denies chest pain.  He is no longer taking lisinopril.  He continues to be on Zetia 10 mg and Lovaza for his mixed hyperlipidemia.  He continues to be on Aricept and Namenda for his dementia.  He had not had recent laboratory repeat laboratory was recommended.  I last saw him on August 24, 2021.  Since his prior evaluation he has been undergoing speech therapy since his dementia now seems to be more affecting his speech.  He is unaware of any chest pain.  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  He is not on any medication for blood pressure or statin therapy and continues to be on Zetia for hyperlipidemia.  He continues to be on Aricept and Namenda for his dementia and is on low-dose levothyroxine for mild hypothyroidism and  Pepcid for GERD.   Since I saw him, he has been evaluated by Dr. [name] of neurology for progressive dementia and worsening gait.  He had completely stopped using CPAP therapy since November 2023.  He has been using supplemental oxygen during the day while watching television.  Recently his Aricept was discontinued due to stomach issues but he continues to be on Namenda.  He is on levothyroxine 25 mcg for mild hypothyroidism.  He takes pantoprazole for GERD.  He continues to be on Zetia and Lovaza daily.  He is on Lexapro 5 mg every other day.  He presents for evaluation.   Current Outpatient Medications  Medication Sig  Dispense Refill   ACETYLCYSTEINE PO Take by mouth.     BRAN FIBER PO Take by mouth.     Coenzyme Q10 (CO Q 10 PO) Take 300 mg by mouth daily.     escitalopram (LEXAPRO) 5 MG tablet Take 1 tablet (5 mg total) by mouth every other day. 50 tablet 4   ezetimibe (ZETIA) 10 MG tablet Take 1 tablet (10 mg total) by mouth daily. 90 tablet 3   famotidine (PEPCID) 20 MG tablet Take 20 mg by mouth daily.      Ginkgo Biloba 40 MG TABS Take 240 mg by mouth daily.     levothyroxine (SYNTHROID) 25 MCG tablet Take 25 mcg by mouth daily before breakfast.     memantine (NAMENDA) 10 MG tablet Take 1 tablet (10 mg total) by mouth 2 (two) times daily. 180 tablet 3   Multiple Vitamins-Minerals (MULTIVITAMIN & MINERAL PO) Take 1 tablet by mouth daily.     omega-3 acid ethyl esters (LOVAZA) 1 g capsule Take 1 capsule (1 g total) by mouth daily. 90 capsule 3   pantoprazole (PROTONIX) 40 MG tablet Take 40 mg by mouth daily.     No current facility-administered medications for this visit.    Social History   Socioeconomic History   Marital status: Married    Spouse name: [name]   Number of children: 1   Years of education: 14   Highest education level: Not on file  Occupational History    Comment: [employer]  Tobacco Use   Smoking status: Never   Smokeless tobacco: Never   Substance and Sexual Activity   Alcohol use: Yes    Alcohol/week: 2.0 standard drinks of alcohol    Types: 2 Standard drinks or equivalent per week    Comment: wine a few days a week   Drug use: No   Sexual activity: Not on file  Other Topics Concern   Not on file  Social History Narrative   Drinks 1 cup of coffee a day    Social Determinants of Health   Financial Resource Strain: Not on file  Food Insecurity: Not on file  Transportation Needs: Not on file  Physical Activity: Not on file  Stress: Not on file  Social Connections: Not on file  Intimate Partner Violence: Not on file    Additional social history is notable in that he is married has one child who is also my patient who has a history of hyperlipidemia.  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  ROS General: Negative; No fevers, chills, or night sweats; fatigue HEENT:  now using a hearing aid, no changes in vision, sinus congestion, difficulty swallowing Pulmonary: Negative; No cough, wheezing, shortness of breath, hemoptysis Cardiovascular: Negative; No chest pain, presyncope, syncope, palpitations GI: Negative; No nausea, vomiting, diarrhea, or abdominal pain GU: Negative; No dysuria, hematuria, or difficulty voiding Musculoskeletal: Negative; no myalgias, joint pain, or weakness Hematologic/Oncology: Negative; no easy bruising, bleeding Endocrine: Negative; no heat/cold intolerance; no diabetes Neuro: Positive for dementia, progressive with gait disturbance Skin: Recent basal cell resection from his nose Psychiatric: Negative; No behavioral problems, depression Sleep:  on ASV per neurology; he is unaware of breakthrough snoring ,daytime sleepiness, hypersomnolence, bruxism, restless legs, hypnogognic hallucinations, no cataplexy Other comprehensive 14 point system review is negative.   PE BP 130/82 (BP Location: Right Arm, Patient Position: Sitting, Cuff Size: Normal)   Pulse 71   Ht 5\' 6"  (1.676 m)    Wt 161  lb (73 kg)   BMI 25.99 kg/m    Repeat blood pressure by me was 118/78  Wt Readings from Last 3 Encounters:  09/06/22 161 lb (73 kg)  06/20/22 161 lb 6.4 oz (73.2 kg)  02/05/22 156 lb (70.8 kg)   General: Alert, oriented, no distress.  Skin: normal turgor, no rashes, warm and dry HEENT: Normocephalic, atraumatic. Pupils equal round and reactive to light; sclera anicteric; extraocular muscles intact;  Nose without nasal septal hypertrophy Mouth/Parynx benign; Mallinpatti scale 3 Neck: No JVD, no carotid bruits; normal carotid upstroke Lungs: clear to ausculatation and percussion; no wheezing or rales Chest wall: without tenderness to palpitation Heart: PMI not displaced, RRR, s1 s2 normal, 1/6 systolic murmur, no diastolic murmur, no rubs, gallops, thrills, or heaves Abdomen: soft, nontender; no hepatosplenomehaly, BS+; abdominal aorta nontender and not dilated by palpation. Back: no CVA tenderness Pulses 2+ Musculoskeletal: full range of motion, normal strength, no joint deformities Extremities: no clubbing cyanosis or edema, Homan's sign negative  Neurologic: grossly nonfocal; Cranial nerves grossly wnl Psychologic: Very flat affect, difficulty with speech    March 28, 2024ECG (independently read by me):  NSR at 71, Q III aVF  August 24, 2021 ECG (independently read by me):  NSR at 60, early transition, no ectopy  July 18, 2020 ECG (independently read by me): NSR at 63; no ectopy, normal intervals  March 20, 2019 ECG (independently read by me): Normal sinus rhythm with mild sinus arrhythmia at 65 bpm.  No ectopy.  Normal intervals  March 2020 ECG (independently read by me): Sinus bradycardia 59 bpm.  No ectopy.  Normal intervals.  April 2019 ECG (independently read by me): Normal sinus rhythm at 64 bpm.  PR interval 156 bili seconds.  QTc interval 456 ms.  January 2018 ECG (independently read by me):  Normal sinus rhythm at 67 bpm.  No ectopy.  Q wave in lead 3  and aVF with preserved R waves.  January 2016ECG: (Independently read by me): Normal sinus rhythm at 72 bpm.  No ectopy.  Normal intervals.  January 2015 ECG: (Independently read by me): Normal sinus rhythm at 71 beats per minute. No ST changes. Normal intervals.   LABS:    Latest Ref Rng & Units 08/24/2021    9:19 AM 07/11/2020    8:57 AM 09/28/2019   10:24 AM  BMP  Glucose 70 - 99 mg/dL 100  111  110   BUN 8 - 27 mg/dL 22  26  31    Creatinine 0.76 - 1.27 mg/dL 1.34  1.10  0.99   BUN/Creat Ratio 10 - 24 16  24  31    Sodium 134 - 144 mmol/L 143  146  144   Potassium 3.5 - 5.2 mmol/L 4.6  4.8  5.1   Chloride 96 - 106 mmol/L 107  109  108   CO2 20 - 29 mmol/L 22  26  22    Calcium 8.6 - 10.2 mg/dL 9.7  9.7  9.8       Latest Ref Rng & Units 08/24/2021    9:19 AM 07/11/2020    8:57 AM 09/28/2019   10:24 AM  Hepatic Function  Total Protein 6.0 - 8.5 g/dL 6.7  6.3  6.7   Albumin 3.7 - 4.7 g/dL 4.4  4.4  4.6   AST 0 - 40 IU/L 27  18  24    ALT 0 - 44 IU/L 41  31  33   Alk Phosphatase 44 - 121 IU/L 61  57  72   Total Bilirubin 0.0 - 1.2 mg/dL 0.4  0.3  0.3       Latest Ref Rng & Units 08/24/2021    9:19 AM 08/22/2020    8:51 AM 03/09/2019    8:43 AM  CBC  WBC 3.4 - 10.8 x10E3/uL 7.1  6.4  6.7   Hemoglobin 13.0 - 17.7 g/dL 13.7  13.6  12.8   Hematocrit 37.5 - 51.0 % 40.5  41.5  38.7   Platelets 150 - 450 x10E3/uL 161  149  168    Lab Results  Component Value Date   MCV 88 08/24/2021   MCV 90 08/22/2020   MCV 92 03/09/2019    No results found for: "HGBA1C"  Lab Results  Component Value Date   TSH 7.360 (H) 08/22/2020   Lipid Panel     Component Value Date/Time   CHOL 196 08/24/2021 0919   TRIG 116 08/24/2021 0919   HDL 60 08/24/2021 0919   CHOLHDL 3.3 08/24/2021 0919   CHOLHDL 3.1 07/08/2017 0803   VLDL 26 06/29/2016 1021   LDLCALC 115 (H) 08/24/2021 0919   LDLCALC 94 07/08/2017 0803     RADIOLOGY: No results found.  IMPRESSION:  1. Mixed hyperlipidemia   2.  Essential hypertension   3. OSA (obstructive sleep apnea)   4. Hypothyroidism, unspecified type   5. Moderate dementia with mood disturbance, unspecified dementia type [facility])      ASSESSMENT AND PLAN: Mr. Shirazi is an 82 year old gentleman who has a history of hypertension and hyperlipidemia. He has lost approximately 20 pounds over the last 12 years.  Remotely he was on Zetia and Livalo for hyperlipidemia and due to insurance issues the Livalo was ultimately changed to pravastatin.  He  developed dementia and was on Aricept and Namenda which is followed by Dr. [name] of neurology.  He was advised by neurology to discontinue the pravastatin and when I had last seen him he had been off therapy for 2 months and his LDL cholesterol had risen to 180.  When seen in March 2020 he was back on Zetia alone and LDL cholesterol improved to 110.  He also started to take his sons omega-3 fatty acid Lovaza capsule daily and his triglycerides have significantly improved to 63.  At a subsequent evaluation since he felt that the statin did not affect his memory he was willing to reinitiate a trial of low-dose Crestor 5 mg but ultimately he stopped taking this.  Over the last several years, his dementia has significantly progressed.  He was recently taken off Aricept by Dr. [name] and continues to be on Namenda 10 mg twice a day.  He stopped using CPAP therapy in November and previously was on BiPAP ASV followed at [facility] neurology.  He has been using supplemental oxygen during the day when watching television.  He has had progressive difficulty with speech and has had difficulty creating complex sentences.  He has not felt to have had a stroke.  He is on low-dose levothyroxine at 25 mcg.  He takes pantoprazole for GERD.  He has not had recent lipid analysis on his Zetia and Lovaza therapy.  I have recommended fasting lipid panel be obtained.  He sees Dr. Terrill Mohr for primary care at [facility] in  [place].  TSH in February 2024 was 2.48.  LFTs were normal.  Creatinine was 1.26 with estimated GFR at 58.   Troy Sine, MD, [facility]  09/12/2022 10:33 AM

## 2022-09-06 NOTE — Patient Instructions (Addendum)
Medication Instructions:   Not needed *If you need a refill on your cardiac medications before your next appointment, please call your pharmacy*   Lab Work: fasting in 2 to 3 monhts LIPID  If you have labs (blood work) drawn today and your tests are completely normal, you will receive your results only by: [place] (if you have MyChart) OR A paper copy in the mail If you have any lab test that is abnormal or we need to change your treatment, we will call you to review the results.   Testing/Procedures: Not needed   Follow-Up: At [facility], you and your health needs are our priority.  As part of our continuing mission to provide you with exceptional heart care, we have created designated Provider Care Teams.  These Care Teams include your primary Cardiologist (physician) and Advanced Practice Providers (APPs -  Physician Assistants and Nurse Practitioners) who all work together to provide you with the care you need, when you need it.     Your next appointment:   8 month(s)  The format for your next appointment:   In Person  Provider:   Shelva Majestic, MD    Other Instructions

## 2022-09-12 ENCOUNTER — Encounter: Payer: Self-pay | Admitting: Cardiovascular Disease

## 2022-10-20 ENCOUNTER — Other Ambulatory Visit: Payer: Self-pay | Admitting: Cardiovascular Disease

## 2022-11-13 ENCOUNTER — Emergency Department (HOSPITAL_BASED_OUTPATIENT_CLINIC_OR_DEPARTMENT_OTHER)
Admission: EM | Admit: 2022-11-13 | Discharge: 2022-11-13 | Disposition: A | Discharge status: Home / Self Care | Payer: Medicare HMO | Attending: Emergency Medicine | Admitting: Emergency Medicine

## 2022-11-13 ENCOUNTER — Emergency Department (HOSPITAL_BASED_OUTPATIENT_CLINIC_OR_DEPARTMENT_OTHER): Payer: Medicare HMO

## 2022-11-13 ENCOUNTER — Encounter (HOSPITAL_BASED_OUTPATIENT_CLINIC_OR_DEPARTMENT_OTHER): Payer: Self-pay

## 2022-11-13 ENCOUNTER — Other Ambulatory Visit: Payer: Self-pay

## 2022-11-13 DIAGNOSIS — R1032 Left lower quadrant pain: Secondary | ICD-10-CM | POA: Diagnosis present

## 2022-11-13 DIAGNOSIS — Z79899 Other long term (current) drug therapy: Secondary | ICD-10-CM | POA: Insufficient documentation

## 2022-11-13 DIAGNOSIS — K5641 Fecal impaction: Secondary | ICD-10-CM

## 2022-11-13 LAB — COMPREHENSIVE METABOLIC PANEL
ALT: 47 U/L — ABNORMAL HIGH (ref 0–44)
AST: 23 U/L (ref 15–41)
Albumin: 4.7 g/dL (ref 3.5–5.0)
Alkaline Phosphatase: 56 U/L (ref 38–126)
Anion gap: 10 (ref 5–15)
BUN: 32 mg/dL — ABNORMAL HIGH (ref 8–23)
CO2: 26 mmol/L (ref 22–32)
Calcium: 10.2 mg/dL (ref 8.9–10.3)
Chloride: 103 mmol/L (ref 98–111)
Creatinine, Ser: 1.24 mg/dL (ref 0.61–1.24)
GFR, Estimated: 59 mL/min — ABNORMAL LOW (ref >60)
Glucose, Bld: 105 mg/dL — ABNORMAL HIGH (ref 70–99)
Potassium: 4.1 mmol/L (ref 3.5–5.1)
Sodium: 139 mmol/L (ref 135–145)
Total Bilirubin: 0.5 mg/dL (ref 0.3–1.2)
Total Protein: 7.4 g/dL (ref 6.5–8.1)

## 2022-11-13 LAB — URINALYSIS, ROUTINE W REFLEX MICROSCOPIC
Bacteria, UA: NONE SEEN
Bilirubin Urine: NEGATIVE
Glucose, UA: NEGATIVE mg/dL
Hgb urine dipstick: NEGATIVE
Ketones, ur: NEGATIVE mg/dL
Nitrite: NEGATIVE
Protein, ur: NEGATIVE mg/dL
Specific Gravity, Urine: 1.013 (ref 1.005–1.030)
pH: 5.5 (ref 5.0–8.0)

## 2022-11-13 LAB — CBC
HCT: 43.7 % (ref 39.0–52.0)
Hemoglobin: 14.7 g/dL (ref 13.0–17.0)
MCH: 30.1 pg (ref 26.0–34.0)
MCHC: 33.6 g/dL (ref 30.0–36.0)
MCV: 89.4 fL (ref 80.0–100.0)
Platelets: 171 10*3/uL (ref 150–400)
RBC: 4.89 MIL/uL (ref 4.22–5.81)
RDW: 13.5 % (ref 11.5–15.5)
WBC: 8.3 10*3/uL (ref 4.0–10.5)
nRBC: 0 % (ref 0.0–0.2)

## 2022-11-13 LAB — LIPASE, BLOOD: Lipase: 30 U/L (ref 11–51)

## 2022-11-13 MED ORDER — LACTATED RINGERS IV BOLUS
1000.0000 mL | Freq: Once | INTRAVENOUS | Status: AC
  Administered 2022-11-13: 1000 mL via INTRAVENOUS

## 2022-11-13 MED ORDER — POLYETHYLENE GLYCOL 3350 17 GM/SCOOP PO POWD: 1.0000 | Freq: Three times a day (TID) | ORAL | 0 refills | Status: AC

## 2022-11-13 MED ORDER — IOHEXOL 300 MG/ML  SOLN
100.0000 mL | Freq: Once | INTRAMUSCULAR | Status: AC | PRN
  Administered 2022-11-13: 85 mL via INTRAVENOUS

## 2022-11-13 NOTE — Discharge Instructions (Signed)

## 2022-11-13 NOTE — ED Triage Notes (Signed)
Patient here POV from UC.  Notes LLQ Sharp Intermittent Pain since this AM. Denies other Symptoms such as N/V/D or Fevers.   Sent for Evaluation.   NAD Noted during Triage. Ambulatory.

## 2022-11-13 NOTE — ED Notes (Signed)
Patient had 2 large bowel movements, MD notified.

## 2022-11-13 NOTE — ED Notes (Signed)
Reviewed AVS with patient's wife, expressed understanding of directions, denies further questions at this time.

## 2022-11-13 NOTE — ED Provider Notes (Signed)
[facility] AT [facility] Provider Note   CSN: [id number] Arrival date & time: 11/13/22  1543     History  Chief Complaint  Patient presents with   Abdominal Pain    Carlos Pierce is a 82 y.o. male.   Abdominal Pain Patient is an 82 year old male with a past medical history significant for memory loss, aphasia, hemorrhoids  Patient is present emergency room today with complaints of lower abdominal pain actually has no pain currently.  He is wife is with him who helps interpret some for him as he has some significant aphasia for uncertain reasons.  No fevers, no urinary frequency urgency dysuria or hematuria.  No chest pain or difficulty breathing lightheadedness dizziness no other associate symptoms.  Patient states he feels well currently and has no symptoms.  It is difficult for me to ascertain when his last bowel movement was wife is unable to clarify.     Home Medications Prior to Admission medications   Medication Sig Start Date End Date Taking? Authorizing Provider  ACETYLCYSTEINE PO Take by mouth.    [provider]  BRAN FIBER PO Take by mouth.    [provider]  Coenzyme Q10 (CO Q 10 PO) Take 300 mg by mouth daily.    [provider]  escitalopram (LEXAPRO) 5 MG tablet Take 1 tablet (5 mg total) by mouth every other day. 06/20/22   Penumalli, Glenford Bayley, MD  ezetimibe (ZETIA) 10 MG tablet TAKE 1 TABLET EVERY DAY 10/22/22   Lennette Bihari, MD  famotidine (PEPCID) 20 MG tablet Take 20 mg by mouth daily.     [provider]  Ginkgo Biloba 40 MG TABS Take 240 mg by mouth daily.    [provider]  levothyroxine (SYNTHROID) 25 MCG tablet Take 25 mcg by mouth daily before breakfast.    [provider]  memantine (NAMENDA) 10 MG tablet Take 1 tablet (10 mg total) by mouth 2 (two) times daily. 06/20/22   Penumalli, Glenford Bayley, MD  Multiple Vitamins-Minerals (MULTIVITAMIN & MINERAL PO) Take 1  tablet by mouth daily.    [provider]  omega-3 acid ethyl esters (LOVAZA) 1 g capsule TAKE 1 CAPSULE EVERY DAY 10/22/22   Lennette Bihari, MD  pantoprazole (PROTONIX) 40 MG tablet Take 40 mg by mouth daily.    [provider]      Allergies    Patient has no known allergies.    Review of Systems   Review of Systems  Gastrointestinal:  Positive for abdominal pain.    Physical Exam Updated Vital Signs BP (!) 162/98 (BP Location: Right Arm)   Pulse 62   Temp 98.1 F (36.7 C) (Oral)   Resp 16   Ht 5\' 6"  (1.676 m)   Wt 73 kg   SpO2 99%   BMI 25.98 kg/m  Physical Exam Vitals and nursing note reviewed.  Constitutional:      General: He is not in acute distress. HENT:     Head: Normocephalic and atraumatic.     Nose: Nose normal.  Eyes:     General: No scleral icterus. Cardiovascular:     Rate and Rhythm: Normal rate and regular rhythm.     Pulses: Normal pulses.     Heart sounds: Normal heart sounds.  Pulmonary:     Effort: Pulmonary effort is normal. No respiratory distress.     Breath sounds: No wheezing.  Abdominal:     Palpations: Abdomen is  soft.     Tenderness: There is no abdominal tenderness.  Genitourinary:    Comments: Josh chaperone for rectal exam/disimpaction  Hard stool ball palpated and significant amount of poop was removed.  The rest is on retrievable out of range of my finger Musculoskeletal:     Cervical back: Normal range of motion.     Right lower leg: No edema.     Left lower leg: No edema.  Skin:    General: Skin is warm and dry.     Capillary Refill: Capillary refill takes less than 2 seconds.  Neurological:     Mental Status: He is alert. Mental status is at baseline.  Psychiatric:        Mood and Affect: Mood normal.        Behavior: Behavior normal.     ED Results / Procedures / Treatments   Labs (all labs ordered are listed, but only abnormal results are displayed) Labs Reviewed  COMPREHENSIVE METABOLIC PANEL  - Abnormal; Notable for the following components:      Result Value   Glucose, Bld 105 (*)    BUN 32 (*)    ALT 47 (*)    GFR, Estimated 59 (*)    All other components within normal limits  URINALYSIS, ROUTINE W REFLEX MICROSCOPIC - Abnormal; Notable for the following components:   Leukocytes,Ua TRACE (*)    All other components within normal limits  LIPASE, BLOOD  CBC    EKG None  Radiology CT ABDOMEN PELVIS W CONTRAST  Result Date: 11/13/2022 CLINICAL DATA:  Left lower quadrant abdominal pain EXAM: CT ABDOMEN AND PELVIS WITH CONTRAST TECHNIQUE: Multidetector CT imaging of the abdomen and pelvis was performed using the standard protocol following bolus administration of intravenous contrast. RADIATION DOSE REDUCTION: This exam was performed according to the departmental dose-optimization program which includes automated exposure control, adjustment of the mA and/or kV according to patient size and/or use of iterative reconstruction technique. CONTRAST:  85mL OMNIPAQUE IOHEXOL 300 MG/ML  SOLN COMPARISON:  None Available. FINDINGS: Lower chest: No acute abnormality. Hepatobiliary: No solid liver abnormality is seen. No gallstones, gallbladder wall thickening, or biliary dilatation. Pancreas: Unremarkable. No pancreatic ductal dilatation or surrounding inflammatory changes. Spleen: Normal in size without significant abnormality. Adrenals/Urinary Tract: Adrenal glands are unremarkable. Multifocal renal cortical scarring bilaterally. Simple, benign bilateral renal cortical cysts, for which no further follow-up or characterization is required. No calculi or hydronephrosis. Bladder is unremarkable. Stomach/Bowel: Stomach is within normal limits. Appendix appears normal. No evidence of bowel wall thickening, distention, or inflammatory changes. Large burden of stool throughout the redundant colon, particularly with a large, dense stool ball in the rectum measuring at least 10.6 cm (series 5, image 66).  Sigmoid diverticula. Vascular/Lymphatic: Aortic atherosclerosis. No enlarged abdominal or pelvic lymph nodes. Reproductive: Prostatomegaly. Other: Small fat containing bilateral inguinal hernias.  No ascites. Musculoskeletal: No acute or significant osseous findings. IMPRESSION: 1. Large burden of stool throughout the redundant colon, particularly with a large, dense stool ball in the rectum measuring at least 10.6 cm. Correlate for fecal impaction. 2. Sigmoid diverticulosis without evidence of acute diverticulitis. 3. Prostatomegaly. Aortic Atherosclerosis (ICD10-I70.0). Electronically Signed   By: Jearld Lesch M.D.   On: 11/13/2022 19:34    Procedures Fecal disimpaction  Date/Time: 11/13/2022 10:22 PM  Performed by: Gailen Shelter, PA Authorized by: Gailen Shelter, PA  Consent: Verbal consent obtained. Risks and benefits: risks, benefits and alternatives were discussed Consent given by: patient Patient understanding: patient states  understanding of the procedure being performed Patient consent: the patient's understanding of the procedure matches consent given Relevant documents: relevant documents present and verified Test results: test results available and properly labeled Imaging studies: imaging studies available Patient identity confirmed: verbally with patient and arm band Local anesthesia used: no  Anesthesia: Local anesthesia used: no  Sedation: Patient sedated: no  Patient tolerance: patient tolerated the procedure well with no immediate complications Comments: Fecal impaction disimpacted partially, enema used with large bowel movement       Medications Ordered in ED Medications  lactated ringers bolus 1,000 mL (0 mLs Intravenous Stopped 11/13/22 1904)  iohexol (OMNIPAQUE) 300 MG/ML solution 100 mL (85 mLs Intravenous Contrast Given 11/13/22 1900)    ED Course/ Medical Decision Making/ A&P Clinical Course as of 11/13/22 2223  Tue Nov 13, 2022  1833 Indicated pain  in umbilicus this AM.   [WF]  2129 IMPRESSION: 1. Large burden of stool throughout the redundant colon, particularly with a large, dense stool ball in the rectum measuring at least 10.6 cm. Correlate for fecal impaction. 2. Sigmoid diverticulosis without evidence of acute diverticulitis. 3. Prostatomegaly.   [WF]    Clinical Course User Index [WF] Gailen Shelter, PA                             Medical Decision Making Amount and/or Complexity of Data Reviewed Labs: ordered. Radiology: ordered.  Risk Prescription drug management.    This patient presents to the ED for concern of abdominal pain, this involves a number of treatment options, and is a complaint that carries with it a moderate risk of complications and morbidity. A differential diagnosis was considered for the patient's symptoms which is discussed below:   The causes of generalized abdominal pain include but are not limited to AAA, mesenteric ischemia, appendicitis, diverticulitis, DKA, gastritis, gastroenteritis, AMI, nephrolithiasis, pancreatitis, peritonitis, adrenal insufficiency,lead poisoning, iron toxicity, intestinal ischemia, constipation, UTI,SBO/LBO, splenic rupture, biliary disease, IBD, IBS, PUD, or hepatitis.   Co morbidities: Discussed in HPI   Brief History:  Patient is an 82 year old male with a past medical history significant for memory loss, aphasia, hemorrhoids  Patient is present emergency room today with complaints of lower abdominal pain actually has no pain currently.  He is wife is with him who helps interpret some for him as he has some significant aphasia for uncertain reasons.  No fevers, no urinary frequency urgency dysuria or hematuria.  No chest pain or difficulty breathing lightheadedness dizziness no other associate symptoms.  Patient states he feels well currently and has no symptoms.  It is difficult for me to ascertain when his last bowel movement was wife is unable to  clarify.    EMR reviewed including pt PMHx, past surgical history and past visits to ER.   See HPI for more details   Lab Tests:   I personally reviewed all laboratory work and imaging. Metabolic panel without any acute abnormality specifically kidney function within normal limits and no significant electrolyte abnormalities. CBC without leukocytosis or significant anemia. Lipase within normal limits, urinalysis unremarkable  Imaging Studies:  Abnormal findings. I personally reviewed all imaging studies. Imaging notable for fecal impaction   IMPRESSION: 1. Large burden of stool throughout the redundant colon, particularly with a large, dense stool ball in the rectum measuring at least 10.6 cm. Correlate for fecal impaction. 2. Sigmoid diverticulosis without evidence of acute diverticulitis. 3. Prostatomegaly.    Cardiac Monitoring:  The patient was maintained on a cardiac monitor.  I personally viewed and interpreted the cardiac monitored which showed an underlying rhythm of: NSR NA   Medicines ordered:  I ordered medication including LR  for hydration.  Reevaluation of the patient after these medicines showed that the patient improved I have reviewed the patients home medicines and have made adjustments as needed   Critical Interventions:     Consults/Attending Physician      Reevaluation:  After the interventions noted above I re-evaluated patient and found that they have :resolved   Social Determinants of Health:      Problem List / ED Course:  Patient with lower abdominal pain that has since resolved he had a large fecal impaction on CT imaging.  This was partially disimpacted and then enema was provided.  Patient a large bowel movement after enema.  Patient feels much improved.  Will discharge at this time with bowel regimen.  Recommend follow-up with PCP and potentially OB/GYN down the road.   Dispostion:  After consideration of the  diagnostic results and the patients response to treatment, I feel that the patent would benefit from discharge home.  Lengthy discussion about bowel regimen had with patient's wife who is his caregiver.  Final Clinical Impression(s) / ED Diagnoses Final diagnoses:  Fecal impaction [facility])    Rx / DC Orders ED Discharge Orders     None         Gailen Shelter, [place] 11/13/22 2223    Ernie Avena, MD 11/13/22 2230

## 2022-11-13 NOTE — ED Notes (Signed)
Soap suds enema administered to patient, NT Jenna at bedside for assistance. Patient able to retain only minimal amounts of enema. Linens changed. Commode at bedside. Wife educated on lowering rail to assist patient to commode.

## 2022-12-19 ENCOUNTER — Encounter: Payer: Self-pay | Admitting: Family Medicine

## 2022-12-19 DIAGNOSIS — R413 Other amnesia: Secondary | ICD-10-CM

## 2022-12-19 NOTE — Telephone Encounter (Signed)
Patient last saw Dr.Penumalli in Jan 2024.

## 2022-12-20 MED ORDER — MEMANTINE HCL 10 MG PO TABS: 10.0000 mg | ORAL_TABLET | Freq: Two times a day (BID) | ORAL | 3 refills | Status: AC

## 2022-12-20 MED ORDER — ESCITALOPRAM OXALATE 5 MG PO TABS: 5.0000 mg | ORAL_TABLET | ORAL | 3 refills | Status: AC

## 2023-01-03 ENCOUNTER — Ambulatory Visit: Payer: Medicare HMO | Admitting: Family Medicine

## 2023-01-31 DIAGNOSIS — R262 Difficulty in walking, not elsewhere classified: Secondary | ICD-10-CM | POA: Diagnosis not present

## 2023-02-25 ENCOUNTER — Other Ambulatory Visit: Payer: Self-pay

## 2023-02-25 ENCOUNTER — Encounter (HOSPITAL_COMMUNITY): Payer: Self-pay | Admitting: Emergency Medicine

## 2023-02-25 ENCOUNTER — Emergency Department (HOSPITAL_COMMUNITY): Payer: Medicare HMO

## 2023-02-25 ENCOUNTER — Observation Stay (HOSPITAL_COMMUNITY)
Admission: EM | Admit: 2023-02-25 | Discharge: 2023-02-26 | Discharge status: Against Medical Advice | Payer: Medicare HMO | Attending: Internal Medicine | Admitting: Internal Medicine

## 2023-02-25 DIAGNOSIS — Z79899 Other long term (current) drug therapy: Secondary | ICD-10-CM | POA: Diagnosis not present

## 2023-02-25 DIAGNOSIS — H9221 Otorrhagia, right ear: Secondary | ICD-10-CM | POA: Diagnosis not present

## 2023-02-25 DIAGNOSIS — R2681 Unsteadiness on feet: Secondary | ICD-10-CM | POA: Diagnosis not present

## 2023-02-25 DIAGNOSIS — E785 Hyperlipidemia, unspecified: Secondary | ICD-10-CM | POA: Diagnosis present

## 2023-02-25 DIAGNOSIS — I10 Essential (primary) hypertension: Secondary | ICD-10-CM | POA: Diagnosis not present

## 2023-02-25 DIAGNOSIS — R4182 Altered mental status, unspecified: Principal | ICD-10-CM

## 2023-02-25 DIAGNOSIS — G309 Alzheimer's disease, unspecified: Secondary | ICD-10-CM | POA: Insufficient documentation

## 2023-02-25 DIAGNOSIS — E039 Hypothyroidism, unspecified: Secondary | ICD-10-CM | POA: Insufficient documentation

## 2023-02-25 DIAGNOSIS — F411 Generalized anxiety disorder: Secondary | ICD-10-CM | POA: Insufficient documentation

## 2023-02-25 DIAGNOSIS — F0280 Dementia in other diseases classified elsewhere without behavioral disturbance: Secondary | ICD-10-CM | POA: Diagnosis not present

## 2023-02-25 DIAGNOSIS — Z1152 Encounter for screening for COVID-19: Secondary | ICD-10-CM | POA: Diagnosis not present

## 2023-02-25 LAB — URINALYSIS, ROUTINE W REFLEX MICROSCOPIC
Bilirubin Urine: NEGATIVE
Glucose, UA: NEGATIVE mg/dL
Hgb urine dipstick: NEGATIVE
Ketones, ur: NEGATIVE mg/dL
Leukocytes,Ua: NEGATIVE
Nitrite: NEGATIVE
Protein, ur: NEGATIVE mg/dL
Specific Gravity, Urine: 1.008 (ref 1.005–1.030)
pH: 7 (ref 5.0–8.0)

## 2023-02-25 LAB — CBC WITH DIFFERENTIAL/PLATELET
Abs Immature Granulocytes: 0.03 10*3/uL (ref 0.00–0.07)
Basophils Absolute: 0 10*3/uL (ref 0.0–0.1)
Basophils Relative: 0 %
Eosinophils Absolute: 0.1 10*3/uL (ref 0.0–0.5)
Eosinophils Relative: 2 %
HCT: 46.8 % (ref 39.0–52.0)
Hemoglobin: 15.2 g/dL (ref 13.0–17.0)
Immature Granulocytes: 0 %
Lymphocytes Relative: 22 %
Lymphs Abs: 1.6 10*3/uL (ref 0.7–4.0)
MCH: 30.4 pg (ref 26.0–34.0)
MCHC: 32.5 g/dL (ref 30.0–36.0)
MCV: 93.6 fL (ref 80.0–100.0)
Monocytes Absolute: 0.6 10*3/uL (ref 0.1–1.0)
Monocytes Relative: 9 %
Neutro Abs: 4.7 10*3/uL (ref 1.7–7.7)
Neutrophils Relative %: 67 %
Platelets: 158 10*3/uL (ref 150–400)
RBC: 5 MIL/uL (ref 4.22–5.81)
RDW: 13.5 % (ref 11.5–15.5)
WBC: 7 10*3/uL (ref 4.0–10.5)
nRBC: 0 % (ref 0.0–0.2)

## 2023-02-25 LAB — COMPREHENSIVE METABOLIC PANEL
ALT: 36 U/L (ref 0–44)
AST: 27 U/L (ref 15–41)
Albumin: 4.1 g/dL (ref 3.5–5.0)
Alkaline Phosphatase: 69 U/L (ref 38–126)
Anion gap: 10 (ref 5–15)
BUN: 16 mg/dL (ref 8–23)
CO2: 24 mmol/L (ref 22–32)
Calcium: 9.6 mg/dL (ref 8.9–10.3)
Chloride: 106 mmol/L (ref 98–111)
Creatinine, Ser: 1.08 mg/dL (ref 0.61–1.24)
GFR, Estimated: >60 mL/min (ref >60)
Glucose, Bld: 94 mg/dL (ref 70–99)
Potassium: 4.3 mmol/L (ref 3.5–5.1)
Sodium: 140 mmol/L (ref 135–145)
Total Bilirubin: 0.6 mg/dL (ref 0.3–1.2)
Total Protein: 7.2 g/dL (ref 6.5–8.1)

## 2023-02-25 LAB — SARS CORONAVIRUS 2 BY RT PCR: SARS Coronavirus 2 by RT PCR: NEGATIVE

## 2023-02-25 LAB — CBG MONITORING, ED: Glucose-Capillary: 77 mg/dL (ref 70–99)

## 2023-02-25 MED ORDER — PANTOPRAZOLE SODIUM 40 MG PO TBEC: 40.0000 mg | DELAYED_RELEASE_TABLET | Freq: Every day | ORAL | Status: DC

## 2023-02-25 MED ORDER — ONDANSETRON HCL 4 MG/2ML IJ SOLN: 4.0000 mg | Freq: Four times a day (QID) | INTRAMUSCULAR | Status: DC | PRN

## 2023-02-25 MED ORDER — HYDRALAZINE HCL 20 MG/ML IJ SOLN: 5.0000 mg | Freq: Four times a day (QID) | INTRAMUSCULAR | Status: DC | PRN

## 2023-02-25 MED ORDER — ENOXAPARIN SODIUM 40 MG/0.4ML IJ SOSY
40.0000 mg | PREFILLED_SYRINGE | INTRAMUSCULAR | Status: DC
  Administered 2023-02-25: 40 mg via SUBCUTANEOUS
  Filled 2023-02-25: qty 0.4

## 2023-02-25 MED ORDER — SENNOSIDES-DOCUSATE SODIUM 8.6-50 MG PO TABS: 1.0000 | ORAL_TABLET | Freq: Every evening | ORAL | Status: DC | PRN

## 2023-02-25 MED ORDER — SODIUM CHLORIDE 0.9% FLUSH: 3.0000 mL | INTRAVENOUS | Status: DC | PRN

## 2023-02-25 MED ORDER — ACETAMINOPHEN 325 MG PO TABS: 650.0000 mg | ORAL_TABLET | Freq: Four times a day (QID) | ORAL | Status: DC | PRN

## 2023-02-25 MED ORDER — SODIUM CHLORIDE 0.9 % IV SOLN: 250.0000 mL | INTRAVENOUS | Status: DC | PRN

## 2023-02-25 MED ORDER — ACETAMINOPHEN 650 MG RE SUPP: 650.0000 mg | Freq: Four times a day (QID) | RECTAL | Status: DC | PRN

## 2023-02-25 MED ORDER — GADOBUTROL 1 MMOL/ML IV SOLN
7.0000 mL | Freq: Once | INTRAVENOUS | Status: AC | PRN
  Administered 2023-02-25: 7 mL via INTRAVENOUS

## 2023-02-25 MED ORDER — LEVOTHYROXINE SODIUM 25 MCG PO TABS: 25.0000 ug | ORAL_TABLET | Freq: Every day | ORAL | Status: DC

## 2023-02-25 MED ORDER — MELATONIN 5 MG PO TABS
5.0000 mg | ORAL_TABLET | Freq: Every day | ORAL | Status: DC
  Administered 2023-02-25: 5 mg via ORAL
  Filled 2023-02-25: qty 1

## 2023-02-25 MED ORDER — SODIUM CHLORIDE 0.9% FLUSH
3.0000 mL | Freq: Two times a day (BID) | INTRAVENOUS | Status: DC
  Administered 2023-02-25: 3 mL via INTRAVENOUS

## 2023-02-25 MED ORDER — ONDANSETRON HCL 4 MG PO TABS: 4.0000 mg | ORAL_TABLET | Freq: Four times a day (QID) | ORAL | Status: DC | PRN

## 2023-02-25 MED ORDER — OMEGA-3-ACID ETHYL ESTERS 1 G PO CAPS
1.0000 | ORAL_CAPSULE | Freq: Every day | ORAL | Status: DC
  Filled 2023-02-25: qty 1

## 2023-02-25 MED ORDER — MEMANTINE HCL 10 MG PO TABS
10.0000 mg | ORAL_TABLET | Freq: Two times a day (BID) | ORAL | Status: DC
  Administered 2023-02-25: 10 mg via ORAL
  Filled 2023-02-25: qty 1

## 2023-02-25 MED ORDER — HALOPERIDOL LACTATE 5 MG/ML IJ SOLN
1.0000 mg | Freq: Four times a day (QID) | INTRAMUSCULAR | Status: DC | PRN
  Administered 2023-02-25: 1 mg via INTRAVENOUS
  Filled 2023-02-25: qty 1

## 2023-02-25 MED ORDER — ESCITALOPRAM OXALATE 10 MG PO TABS: 5.0000 mg | ORAL_TABLET | ORAL | Status: DC

## 2023-02-25 MED ORDER — EZETIMIBE 10 MG PO TABS: 10.0000 mg | ORAL_TABLET | Freq: Every day | ORAL | Status: DC

## 2023-02-25 NOTE — ED Provider Triage Note (Addendum)
Emergency Medicine Provider Triage Evaluation Note  Carlos Pierce , a 82 y.o. male  was evaluated in triage.  Pt complains of altered mental status.  Patient was brought in from [facility] assisted living by EMS with concerns of altered mental status.  Patient is reportedly demented at baseline but is not having difficulty speaking any words.  No prior history of stroke per patient's report but obviously patient is unreliable historian.  Patient was last seen normal at approximately 6:30 AM this morning.  Patient history includes hyperlipidemia, hypertension, GERD, and obstructive sleep apnea.  Review of Systems  Positive: As above Negative: As above  Physical Exam  BP (!) 169/83 (BP Location: Right Arm)   Pulse 64   Temp 97.8 F (36.6 C) (Oral)   Resp 16   Ht 5\' 6"  (1.676 m)   Wt 73 kg   SpO2 100%   BMI 25.98 kg/m  Gen:   Awake, no distress   Resp:  Normal effort MSK:   Tremulous and unsteady gait.  Unable to stand on his own. Other:  Difficult to assess cranial nerves as patient is having difficulty following instructions.  Medical Decision Making  Medically screening exam initiated at 12:48 PM.  Appropriate orders placed.  Carlos Pierce was informed that the remainder of the evaluation will be completed by another provider, this initial triage assessment does not replace that evaluation, and the importance of remaining in the ED until their evaluation is complete.  Patient is currently outside of code stroke window.  Code LVO also not initiated as patient is not having signs of hemiplegia or neglect.     Smitty Knudsen, PA-C 02/25/23 1249

## 2023-02-25 NOTE — ED Triage Notes (Signed)
Pt to the Ed from [facility] an assisted living via EMS with a CC of change in mental status. Pt is demented at baseline but can now not speak any words and is not able to walk as he normally can. Pt was LSN at   630 am. Family is on the way to hospital

## 2023-02-25 NOTE — ED Notes (Signed)
Pt increasingly agitated, removing gown, pulling at IV, and attempting to get OOB. Attempted to reorient pt, removed from monitoring equipment, and redressed pt in his own clothes. Pt continues to attempt to get OOB. Wife concerned about pts confusion. MD Sundil made aware. PRN orders placed.

## 2023-02-25 NOTE — Progress Notes (Addendum)
  Patient's nurse reported that patient is very agitated trying to pull up his IV line. - Ordered Haldol as needed. -Patient's bedside nurse is very worried about his agitation.  Patient is sundowning.  He has history of Alzheimer's disease.  Requesting for bedside sitter for frequent reorientation.  Tereasa Coop, MD [facility] 02/25/2023, 11:34 PM

## 2023-02-25 NOTE — H&P (Signed)
History and Physical    Carlos Pierce XNA:[id number] DOB: 12/31/1940 DOA: 02/25/2023  PCP: Gwenlyn Found, MD   Patient coming from: [facility]   Chief Complaint:  Chief Complaint  Patient presents with   Altered Mental Status    HPI:  Carlos Pierce is a 82 y.o. male with medical history significant of chronic aphasia, Alzheimer dementia, essential hypertension, hyperlipidemia, generalized anxiety disorder, hypothyroidism and GERD presented to emergency department for evaluation for finding difficulty of worse, unsteady gait and confusion.  Patient's wife reported that for last few days patient has unsteady gait and having problem ambulation with his walker.  Also he seems more confused more than usual.  Patient's wife reported patient usually has good and bad days regarding his speech quality and sometime has understandable speech with short paces but other days it is significantly difficult forming any words.  Currently experiencing the latter with poor word finding and word formation.  The larger concern that patient's wife has is that patient has had a drastic decline in his ability to ambulate.  Typically walks at his assisted living with assistance of a walker but most recently appears to be having more unsteadiness on his feet but has not communicated any signs of dizziness, syncope, presyncope and fall.    ED Course:  At ED on presentation patient is hemodynamically stable except found to have elevated blood pressure 169/83. CBG showed blood glucose 77. CMP unremarkable. CBC unremarkable. UA no evidence of UTI  EKG showed normal sinus rhythm heart rate 63.  Chest x-ray no acute cardiopulmonary disease.  Underinflation.  CT head IMPRESSION: 1. Hypodensity within the left ventral aspect of the pons, consistent with age-indeterminate ischemic change. 2. No evidence of acute hemorrhage. 3. Chronic right mastoid effusion.  MRI  head IMPRESSION: 1. No acute intracranial abnormality. 2. Progressive dilatation of the lateral and third ventricles since 2018 with acute callosal angle and elevated Evans index. These findings can be seen in the setting of idiopathic normal pressure hydrocephalus. 3. Right mastoid and middle ear effusion, suggestive of otomastoiditis and/or Eustachian tube dysfunction.  Patient's wife reported that patient has history of chronic middle ear effusion which is known.  ED physician discussed case with neurology Dr. Jenny Reichmann who recommended MRI head.  Given him head is unremarkable no further workup needed from neurology standpoint.  Hospitalist has been contacted for further evaluation and management of unsteady gait and patient possibly need PT and OT evaluation and placement to skilled nursing facility from assisted-living facility. Patient's wife specifically requested to send him to his assisted living facility with physical therapy over there.  Review of Systems:  Review of Systems  Constitutional:  Negative for chills, fever, malaise/fatigue and weight loss.  Eyes:  Negative for blurred vision, double vision, photophobia, pain, discharge and redness.  Respiratory:  Negative for cough and shortness of breath.   Cardiovascular:  Negative for chest pain and leg swelling.  Gastrointestinal:  Negative for heartburn and nausea.  Musculoskeletal:  Negative for back pain, joint pain, myalgias and neck pain.  Neurological:  Negative for dizziness, tingling, tremors, sensory change, speech change, focal weakness, seizures, loss of consciousness, weakness and headaches.  Psychiatric/Behavioral:  The patient is not nervous/anxious.     Past Medical History:  Diagnosis Date   Memory loss     Past Surgical History:  Procedure Laterality Date   CATARACT EXTRACTION Bilateral    HEMORRHOID BANDING  2016     reports that he  has never smoked. He has never used smokeless tobacco. He  reports that he does not currently use alcohol after a past usage of about 2.0 standard drinks of alcohol per week. He reports that he does not use drugs.  No Known Allergies  History reviewed. No pertinent family history.  Prior to Admission medications   Medication Sig Start Date End Date Taking? Authorizing Provider  ACETYLCYSTEINE PO Take by mouth.    [provider]  BRAN FIBER PO Take by mouth.    [provider]  Coenzyme Q10 (CO Q 10 PO) Take 300 mg by mouth daily.    [provider]  escitalopram (LEXAPRO) 5 MG tablet Take 1 tablet (5 mg total) by mouth every other day. 12/20/22   Lomax, Amy, NP  ezetimibe (ZETIA) 10 MG tablet TAKE 1 TABLET EVERY DAY 10/22/22   Lennette Bihari, MD  famotidine (PEPCID) 20 MG tablet Take 20 mg by mouth daily.     [provider]  Ginkgo Biloba 40 MG TABS Take 240 mg by mouth daily.    [provider]  levothyroxine (SYNTHROID) 25 MCG tablet Take 25 mcg by mouth daily before breakfast.    [provider]  memantine (NAMENDA) 10 MG tablet Take 1 tablet (10 mg total) by mouth 2 (two) times daily. 12/20/22   Lomax, Amy, NP  Multiple Vitamins-Minerals (MULTIVITAMIN & MINERAL PO) Take 1 tablet by mouth daily.    [provider]  omega-3 acid ethyl esters (LOVAZA) 1 g capsule TAKE 1 CAPSULE EVERY DAY 10/22/22   Lennette Bihari, MD  pantoprazole (PROTONIX) 40 MG tablet Take 40 mg by mouth daily.    [provider]  polyethylene glycol powder (GLYCOLAX/MIRALAX) 17 GM/SCOOP powder Take 255 g by mouth in the morning, at noon, and at bedtime. One scoop in beverage of your choice with each meal. You may increase if you continue to be constipated and decrease if your stool becomes too loose. 11/13/22   Gailen Shelter, PA     Physical Exam: Vitals:   02/25/23 1851 02/25/23 1852 02/25/23 1945 02/25/23 1953  BP: (!) 159/91  (!) 145/60   Pulse:  75 67   Resp: 18  17   Temp:    98.2 F (36.8 C)   TempSrc:    Oral  SpO2:  98% 98%   Weight:      Height:        Physical Exam HENT:     Mouth/Throat:     Mouth: Mucous membranes are moist.  Eyes:     Pupils: Pupils are equal, round, and reactive to light.  Cardiovascular:     Rate and Rhythm: Normal rate and regular rhythm.     Pulses: Normal pulses.     Heart sounds: Normal heart sounds. No murmur heard. Pulmonary:     Effort: Pulmonary effort is normal.     Breath sounds: Normal breath sounds.  Abdominal:     General: Bowel sounds are normal.     Palpations: Abdomen is soft.  Musculoskeletal:     Cervical back: Neck supple.     Right lower leg: No edema.     Left lower leg: No edema.  Skin:    Capillary Refill: Capillary refill takes less than 2 seconds.  Neurological:     Mental Status: He is alert.     Cranial Nerves: No cranial nerve deficit.     Sensory: No sensory deficit.     Motor: No  weakness.     Coordination: Coordination normal.     Gait: Gait abnormal.     Deep Tendon Reflexes: Reflexes normal.     Comments: Patient is alert but not oriented to place and self.  He has Alzheimer disease at the baseline.  He is pleasantly confused.  Psychiatric:     Comments: Unable to assess      Labs on Admission: I have personally reviewed following labs and imaging studies  CBC: Recent Labs  Lab 02/25/23 1228  WBC 7.0  NEUTROABS 4.7  HGB 15.2  HCT 46.8  MCV 93.6  PLT 158   Basic Metabolic Panel: Recent Labs  Lab 02/25/23 1228  NA 140  K 4.3  CL 106  CO2 24  GLUCOSE 94  BUN 16  CREATININE 1.08  CALCIUM 9.6   GFR: Estimated Creatinine Clearance: 49.2 mL/min (by C-G formula based on SCr of 1.08 mg/dL). Liver Function Tests: Recent Labs  Lab 02/25/23 1228  AST 27  ALT 36  ALKPHOS 69  BILITOT 0.6  PROT 7.2  ALBUMIN 4.1   No results for input(s): "LIPASE", "AMYLASE" in the last 168 hours. No results for input(s): "AMMONIA" in the last 168 hours. Coagulation Profile: No results for  input(s): "INR", "PROTIME" in the last 168 hours. Cardiac Enzymes: No results for input(s): "CKTOTAL", "CKMB", "CKMBINDEX", "TROPONINI", "TROPONINIHS" in the last 168 hours. BNP (last 3 results) No results for input(s): "BNP" in the last 8760 hours. HbA1C: No results for input(s): "HGBA1C" in the last 72 hours. CBG: Recent Labs  Lab 02/25/23 1221  GLUCAP 77   Lipid Profile: No results for input(s): "CHOL", "HDL", "LDLCALC", "TRIG", "CHOLHDL", "LDLDIRECT" in the last 72 hours. Thyroid Function Tests: No results for input(s): "TSH", "T4TOTAL", "FREET4", "T3FREE", "THYROIDAB" in the last 72 hours. Anemia Panel: No results for input(s): "VITAMINB12", "FOLATE", "FERRITIN", "TIBC", "IRON", "RETICCTPCT" in the last 72 hours. Urine analysis:    Component Value Date/Time   COLORURINE STRAW (A) 02/25/2023 1730   APPEARANCEUR CLEAR 02/25/2023 1730   LABSPEC 1.008 02/25/2023 1730   PHURINE 7.0 02/25/2023 1730   GLUCOSEU NEGATIVE 02/25/2023 1730   HGBUR NEGATIVE 02/25/2023 1730   BILIRUBINUR NEGATIVE 02/25/2023 1730   KETONESUR NEGATIVE 02/25/2023 1730   PROTEINUR NEGATIVE 02/25/2023 1730   NITRITE NEGATIVE 02/25/2023 1730   LEUKOCYTESUR NEGATIVE 02/25/2023 1730    Radiological Exams on Admission: I have personally reviewed images MR Brain W and Wo Contrast  Result Date: 02/25/2023 CLINICAL DATA:  Neuro deficit, acute, stroke suspected. EXAM: MRI HEAD WITHOUT AND WITH CONTRAST TECHNIQUE: Multiplanar, multiecho pulse sequences of the brain and surrounding structures were obtained without and with intravenous contrast. CONTRAST:  7mL GADAVIST GADOBUTROL 1 MMOL/ML IV SOLN COMPARISON:  MRI brain 08/07/2016.  Head CT 02/25/2023. FINDINGS: Brain: No acute infarct or hemorrhage. The previously questioned abnormality in the brainstem on same-day head CT is resolved to represent artifact. Mild chronic small-vessel disease. Hemosiderin deposition in the left frontal periventricular white matter from  prior hemorrhage. Progressive dilatation of the lateral and third ventricles since 2018 with acute callosal angle and elevated Evans index. No extra-axial collection, mass or midline shift. Motion degraded postcontrast images without definite abnormal enhancement. Vascular: Normal flow voids. Skull and upper cervical spine: Normal marrow signal. Sinuses/Orbits: Right mastoid and middle ear effusion. Trace fluid in the left mastoid air cells. No nasopharyngeal mass. Orbits are unremarkable. Other: None. IMPRESSION: 1. No acute intracranial abnormality. 2. Progressive dilatation of the lateral and third ventricles since 2018 with acute  callosal angle and elevated Evans index. These findings can be seen in the setting of idiopathic normal pressure hydrocephalus. 3. Right mastoid and middle ear effusion, suggestive of otomastoiditis and/or Eustachian tube dysfunction. Electronically Signed   By: Orvan Falconer M.D.   On: 02/25/2023 19:33   CT Head Wo Contrast  Result Date: 02/25/2023 CLINICAL DATA:  Altered level of consciousness, neurologic deficit EXAM: CT HEAD WITHOUT CONTRAST TECHNIQUE: Contiguous axial images were obtained from the base of the skull through the vertex without intravenous contrast. RADIATION DOSE REDUCTION: This exam was performed according to the departmental dose-optimization program which includes automated exposure control, adjustment of the mA and/or kV according to patient size and/or use of iterative reconstruction technique. COMPARISON:  08/07/2016 FINDINGS: Brain: Focal hypodensity within the left ventral aspect of the pons, reference image 13/3, compatible with age indeterminate ischemic change. No other signs of acute infarct or hemorrhage. Diffuse cerebral atrophy with ex vacuo dilatation of the lateral ventricles. Remaining midline structures are unremarkable. No acute extra-axial fluid collections. No mass effect. Vascular: No hyperdense vessel or unexpected calcification. Skull:  Normal. Negative for fracture or focal lesion. Sinuses/Orbits: Chronic right mastoid effusion. Remaining paranasal sinuses are clear. Other: None. IMPRESSION: 1. Hypodensity within the left ventral aspect of the pons, consistent with age-indeterminate ischemic change. 2. No evidence of acute hemorrhage. 3. Chronic right mastoid effusion. Electronically Signed   By: Sharlet Salina M.D.   On: 02/25/2023 16:05   DG Chest 2 View  Result Date: 02/25/2023 CLINICAL DATA:  Altered mental status EXAM: CHEST - 2 VIEW COMPARISON:  None Available. FINDINGS: Underinflation. Slight elevation of the right hemidiaphragm. No consolidation, pneumothorax or effusion. No edema. Normal cardiopericardial silhouette. IMPRESSION: Underinflation.  No acute cardiopulmonary disease. Electronically Signed   By: Karen Kays M.D.   On: 02/25/2023 14:38    EKG: My personal interpretation of EKG shows: Normal sinus rhythm heart rate 63.  No ST and T wave abnormality    Assessment/Plan: Principal Problem:   Unstable gait Active Problems:   Essential hypertension   Hyperlipidemia   Unsteady gait   Alzheimer disease (HCC)   GAD (generalized anxiety disorder)   Hypothyroidism    Assessment and Plan: Unsteady/unstable gait -Patient presenting with unsteady gait and weakness with increased confusion for last few days.  However unsteady gait has been progressively getting worse for last 24 hours.  Patient's wife very worried and she wants to be evaluated in the hospital and get admitted as well. - At presentation to ED patient blood pressure Prough slightly elevated 163/83 otherwise hemodynamically stable - CBC and CMP grossly unremarkable. - CT head no acute intracranial finding.  It showed Hypodensity within the left ventral aspect of the pons, consistent with age-indeterminate ischemic change.  No evidence of acute hemorrhage.  Chronic right mastoid effusion. -MRI of the head- No acute intracranial abnormality. 2.  Progressive dilatation of the lateral and third ventricles since 2018 with acute callosal angle and elevated Evans index. These findings can be seen in the setting of idiopathic normal pressure hydrocephalus. 3. Right mastoid and middle ear effusion, suggestive of otomastoiditis and/or Eustachian tube dysfunction. -On physical exam patient does not have any sign of ear infection.  No pain around the mastoid area.  Ear exam unremarkable at bedside.   -I believe alongside with Alzheimer disease right middle ear effusion also contributing unstable/unsteady gait.   -Consulted inpatient PT vestibular therapy. - Consulted inpatient PT and OT for balance evaluation as well. -Plan to continue fall  precaution. -Admitting patient for observation.  Right middle ear effusion -No sign of infection at this time. -On discharge need to refer to establish care with outpatient ENT.  Alzheimer disease History of aphasia -Patient has history of Alzheimer disease and chronic expressive aphasia due to Alzheimer's disease. - Patient's wife reported patient has good or bad days.  Patient is pleasantly confused.Marland Kitchen  He is alert but not oriented to self. -Continue frequent reorientation. - Continue memantine 10 mg twice daily.   Hyperlipidemia -Continue ezetimibe 10 mg daily  Essential hypertension -Not currently on any home blood pressure regimen.  Blood pressure is well-controlled in the ED as well. - Continue hydralazine as needed 5 mg every 6 as needed with systolic blood pressure more than 160.  Generalized anxiety disorder -Continue Celexa 5 mg daily  Hypothyroidism -Continue levothyroxine 25 mcg daily   DVT prophylaxis:  Lovenox Code Status:  Full Code.  Verified with patient's wife at the bedside. Diet: Heart healthy diet Family Communication: Patient's wife at bedside. Disposition Plan: Pending PT and OT evaluation for gait and balance.  Patient's wife requested physical therapy at assisted  living facility. Consults: PT and OT Admission status:   Observation, Med-Surg  Severity of Illness: The appropriate patient status for this patient is OBSERVATION. Observation status is judged to be reasonable and necessary in order to provide the required intensity of service to ensure the patient's safety. The patient's presenting symptoms, physical exam findings, and initial radiographic and laboratory data in the context of their medical condition is felt to place them at decreased risk for further clinical deterioration. Furthermore, it is anticipated that the patient will be medically stable for discharge from the hospital within 2 midnights of admission.     Tereasa Coop, MD [facility]  How to contact the [facility] Attending or Consulting provider 7A - 7P or covering provider during after hours 7P -7A, for this patient.  Check the care team in [facility] and look for a) attending/consulting [facility] provider listed and b) the [facility] team listed Log into [email or web address] and use [place]'s universal password to access. If you do not have the password, please contact the hospital operator. Locate the [facility] provider you are looking for under [facility] and page to a number that you can be directly reached. If you still have difficulty reaching the provider, please page the [facility] (Director on Call) for the Hospitalists listed on amion for assistance.  02/25/2023, 10:36 PM

## 2023-02-25 NOTE — ED Notes (Signed)
MD Lawsing notified of pt symptomes, no additional orders at this time.

## 2023-02-25 NOTE — ED Provider Notes (Signed)
[place] EMERGENCY DEPARTMENT AT [facility] Provider Note   CSN: [id number] Arrival date & time: 02/25/23  1207     History {Add pertinent medical, surgical, social history, OB history to HPI:1} Chief Complaint  Patient presents with   Altered Mental Status    Carlos Pierce is a 82 y.o. male.  Patient with a history of dementia, obstructive sleep apnea, hypertension, hyperlipidemia presents the emergency department with reported altered mental status.  Patient currently lives at [facility] and assisted living facility and they were concerned about possible mental status change.  Patient reportedly slept on a communal couch last night instead of in his own room and he woke up this morning seemingly excessively tired.  Patient's wife in the room reporting majority of this history as patient is unable to recall any of this for himself.  She reports that patient at baseline typically has good and bad days regarding speech quality and sometimes has understandable speech with short phrases but other days has significant difficulty forming any words.  Currently experiencing the latter with poor word finding and word formation.  The larger concern that patient's wife has is that patient has had a drastic decline in his ability to ambulate.  Typically walks at his assisted living with assistance of a walker but most recently appears to be having more unsteadiness on his feet but has not communicated any signs of dizziness.   Altered Mental Status      Home Medications Prior to Admission medications   Medication Sig Start Date End Date Taking? Authorizing Provider  ACETYLCYSTEINE PO Take by mouth.    [provider]  BRAN FIBER PO Take by mouth.    [provider]  Coenzyme Q10 (CO Q 10 PO) Take 300 mg by mouth daily.    [provider]  escitalopram (LEXAPRO) 5 MG tablet Take 1 tablet (5 mg total) by mouth every other day. 12/20/22    Lomax, Amy, NP  ezetimibe (ZETIA) 10 MG tablet TAKE 1 TABLET EVERY DAY 10/22/22   Lennette Bihari, MD  famotidine (PEPCID) 20 MG tablet Take 20 mg by mouth daily.     [provider]  Ginkgo Biloba 40 MG TABS Take 240 mg by mouth daily.    [provider]  levothyroxine (SYNTHROID) 25 MCG tablet Take 25 mcg by mouth daily before breakfast.    [provider]  memantine (NAMENDA) 10 MG tablet Take 1 tablet (10 mg total) by mouth 2 (two) times daily. 12/20/22   Lomax, Amy, NP  Multiple Vitamins-Minerals (MULTIVITAMIN & MINERAL PO) Take 1 tablet by mouth daily.    [provider]  omega-3 acid ethyl esters (LOVAZA) 1 g capsule TAKE 1 CAPSULE EVERY DAY 10/22/22   Lennette Bihari, MD  pantoprazole (PROTONIX) 40 MG tablet Take 40 mg by mouth daily.    [provider]  polyethylene glycol powder (GLYCOLAX/MIRALAX) 17 GM/SCOOP powder Take 255 g by mouth in the morning, at noon, and at bedtime. One scoop in beverage of your choice with each meal. You may increase if you continue to be constipated and decrease if your stool becomes too loose. 11/13/22   Gailen Shelter, PA      Allergies    Patient has no known allergies.    Review of Systems   Review of Systems  Physical Exam Updated Vital Signs BP (!) 169/83 (BP Location: Right Arm)   Pulse 64   Temp 97.8 F (36.6  C) (Oral)   Resp 16   Ht 5\' 6"  (1.676 m)   Wt 73 kg   SpO2 100%   BMI 25.98 kg/m  Physical Exam  ED Results / Procedures / Treatments   Labs (all labs ordered are listed, but only abnormal results are displayed) Labs Reviewed  SARS CORONAVIRUS 2 BY RT PCR  CBC WITH DIFFERENTIAL/PLATELET  COMPREHENSIVE METABOLIC PANEL  URINALYSIS, ROUTINE W REFLEX MICROSCOPIC  CBG MONITORING, ED    EKG None  Radiology No results found.  Procedures Procedures  {Document cardiac monitor, telemetry assessment procedure when appropriate:1}  Medications Ordered in ED Medications - No data to  display  ED Course/ Medical Decision Making/ A&P   {   Click here for ABCD2, HEART and other calculatorsREFRESH Note before signing :1}                              Medical Decision Making Amount and/or Complexity of Data Reviewed Labs: ordered. Radiology: ordered.   ***  {Document critical care time when appropriate:1} {Document review of labs and clinical decision tools ie heart score, Chads2Vasc2 etc:1}  {Document your independent review of radiology images, and any outside records:1} {Document your discussion with family members, caretakers, and with consultants:1} {Document social determinants of health affecting pt's care:1} {Document your decision making why or why not admission, treatments were needed:1} Final Clinical Impression(s) / ED Diagnoses Final diagnoses:  None    Rx / DC Orders ED Discharge Orders     None

## 2023-02-25 NOTE — ED Notes (Signed)
Patient transported to MRI 

## 2023-02-25 NOTE — ED Notes (Signed)
Pt grabbing this RN and swinging at wife. Pt continuing to attempt to get OOB.

## 2023-02-25 NOTE — ED Notes (Signed)
Pt back from MRI 

## 2023-02-26 DIAGNOSIS — R2689 Other abnormalities of gait and mobility: Secondary | ICD-10-CM | POA: Diagnosis not present

## 2023-02-26 DIAGNOSIS — M545 Low back pain, unspecified: Secondary | ICD-10-CM | POA: Diagnosis not present

## 2023-02-26 DIAGNOSIS — M6281 Muscle weakness (generalized): Secondary | ICD-10-CM | POA: Diagnosis not present

## 2023-02-26 DIAGNOSIS — Z7689 Persons encountering health services in other specified circumstances: Secondary | ICD-10-CM

## 2023-02-26 DIAGNOSIS — G308 Other Alzheimer's disease: Secondary | ICD-10-CM | POA: Diagnosis not present

## 2023-02-26 DIAGNOSIS — R2681 Unsteadiness on feet: Secondary | ICD-10-CM | POA: Diagnosis not present

## 2023-02-26 LAB — COMPREHENSIVE METABOLIC PANEL
ALT: 33 U/L (ref 0–44)
AST: 23 U/L (ref 15–41)
Albumin: 3.6 g/dL (ref 3.5–5.0)
Alkaline Phosphatase: 63 U/L (ref 38–126)
Anion gap: 9 (ref 5–15)
BUN: 14 mg/dL (ref 8–23)
CO2: 24 mmol/L (ref 22–32)
Calcium: 9.2 mg/dL (ref 8.9–10.3)
Chloride: 106 mmol/L (ref 98–111)
Creatinine, Ser: 1.11 mg/dL (ref 0.61–1.24)
GFR, Estimated: >60 mL/min (ref >60)
Glucose, Bld: 106 mg/dL — ABNORMAL HIGH (ref 70–99)
Potassium: 4.3 mmol/L (ref 3.5–5.1)
Sodium: 139 mmol/L (ref 135–145)
Total Bilirubin: 0.9 mg/dL (ref 0.3–1.2)
Total Protein: 6.4 g/dL — ABNORMAL LOW (ref 6.5–8.1)

## 2023-02-26 LAB — CBC
HCT: 43.7 % (ref 39.0–52.0)
Hemoglobin: 14.3 g/dL (ref 13.0–17.0)
MCH: 29.4 pg (ref 26.0–34.0)
MCHC: 32.7 g/dL (ref 30.0–36.0)
MCV: 89.9 fL (ref 80.0–100.0)
Platelets: 148 10*3/uL — ABNORMAL LOW (ref 150–400)
RBC: 4.86 MIL/uL (ref 4.22–5.81)
RDW: 13.1 % (ref 11.5–15.5)
WBC: 6.3 10*3/uL (ref 4.0–10.5)
nRBC: 0 % (ref 0.0–0.2)

## 2023-02-26 NOTE — Discharge Summary (Addendum)
AMA  Patient' wife son and patient at this time expresses desire to leave the Hospital immidiately, patient has been warned that this is not Medically advisable at this time, and can result in Medical complications like Death and Disability, patient understands and accepts the risks involved and assumes full responsibilty of this decision. I made my best effort to convince him to stay. They have been waiting in the ED for the room upstairs and has been tired of waiting and patient getting agitated because of long wait.  They want to return back to memory care facility today.  Lanae Boast M.D on 02/26/2023 at 8:53 AM  [facility]  Time < 30 minutes  Last Note Below from admission  82 y.o. male with medical history significant of chronic aphasia, Alzheimer dementia, essential hypertension, hyperlipidemia, generalized anxiety disorder, hypothyroidism and GERD presented to emergency department for evaluation for finding difficulty of worse, unsteady gait and confusion.  Patient's wife reported that for last few days patient has unsteady gait and having problem ambulation with his walker.  Also he seems more confused more than usual.  Patient's wife reported patient usually has good and bad days regarding his speech quality and sometime has understandable speech with short paces but other days it is significantly difficult forming any words.  Currently experiencing the latter with poor word finding and word formation.  The larger concern that patient's wife has is that patient has had a drastic decline in his ability to ambulate.  Typically walks at his assisted living with assistance of a walker but most recently appears to be having more unsteadiness on his feet but has not communicated any signs of dizziness, syncope, presyncope and fall also has right  mastoid and middle ear effusions suggestive of otomastoiditis and/or eustachian tube dysfunction   Patient was seen in the ED underwent extensive lab work no evidence of UTI or pneumonia underwent CT head and MRI brain no acute finding other > Progressive dilatation of the lateral and third ventricles since 2018 with acute callosal angle and elevated Evans index. These findings can be seen in the setting of idiopathic normal pressure hydrocephalus.  See assessment plan from admitting physician below  ------  Unsteady/unstable gait -Patient presenting with unsteady gait and weakness with increased confusion for last few days.  However unsteady gait has been progressively getting worse for last 24 hours.  Patient's wife very worried and she wants to be evaluated in the hospital and get admitted as well. - At presentation to ED patient blood pressure Prough slightly elevated 163/83 otherwise hemodynamically stable - CBC and CMP grossly unremarkable. - CT head no acute intracranial finding.  It showed Hypodensity within the left ventral aspect of the pons, consistent with age-indeterminate ischemic change.  No evidence of acute hemorrhage.  Chronic right mastoid effusion. -MRI of the head- No acute intracranial abnormality. 2. Progressive dilatation of the lateral and third ventricles since 2018 with acute callosal angle and elevated Evans index. These findings can be seen in the setting of idiopathic normal pressure hydrocephalus. 3. Right mastoid and middle ear effusion, suggestive of otomastoiditis and/or Eustachian tube dysfunction. -On physical exam patient does not have any  sign of ear infection.  No pain around the mastoid area.  Ear exam unremarkable at bedside.   -I believe alongside with Alzheimer disease right middle ear effusion also contributing unstable/unsteady gait.   -Consulted inpatient PT vestibular therapy. - Consulted inpatient PT and OT for balance evaluation as well. -Plan  to continue fall precaution. -Admitting patient for observation.   Right middle ear effusion -No sign of infection at this time. -On discharge need to refer to establish care with outpatient ENT.   Alzheimer disease History of aphasia -Patient has history of Alzheimer disease and chronic expressive aphasia due to Alzheimer's disease. - Patient's wife reported patient has good or bad days.  Patient is pleasantly confused.Marland Kitchen  He is alert but not oriented to self. -Continue frequent reorientation. - Continue memantine 10 mg twice daily.     Hyperlipidemia -Continue ezetimibe 10 mg daily   Essential hypertension -Not currently on any home blood pressure regimen.  Blood pressure is well-controlled in the ED as well. - Continue hydralazine as needed 5 mg every 6 as needed with systolic blood pressure more than 160.   Generalized anxiety disorder -Continue Celexa 5 mg daily   Hypothyroidism -Continue levothyroxine 25 mcg daily

## 2023-02-26 NOTE — ED Notes (Signed)
Wife reports hx of sleep apnea. Pt resting comfortably at this time. Able to place pulse ox on finger, pt intermittently drops to 89% on RA. Placed on 2L [place] for periods of sleep apnea.

## 2023-02-26 NOTE — ED Notes (Signed)
ED TO INPATIENT HANDOFF REPORT  ED Nurse Name and Phone #: Pollie Meyer [phone]  S Name/Age/Gender Carlos Pierce 82 y.o. male Room/Bed: 044C/044C  Code Status   Code Status: Full Code  Home/SNF/Other Home Patient oriented to: self Is this baseline? No   Triage Complete: Triage complete  Chief Complaint Unstable gait [R26.81]  Triage Note Pt to the Ed from [facility] an assisted living via EMS with a CC of change in mental status. Pt is demented at baseline but can now not speak any words and is not able to walk as he normally can. Pt was LSN at   630 am. Family is on the way to hospital    Allergies No Known Allergies  Level of Care/Admitting Diagnosis ED Disposition     ED Disposition  Admit   Condition  --   Comment  Hospital Area: [facility] [[id number]]  Level of Care: Med-Surg [16]  May place patient in observation at [facility] or [facility] if equivalent level of care is available:: No  Covid Evaluation: Asymptomatic - no recent exposure (last 10 days) testing not required  Diagnosis: Unstable gait [289158]  Admitting Physician: Tereasa Coop [[id number]]  Attending Physician: Tereasa Coop [[id number]]          B Medical/Surgery History Past Medical History:  Diagnosis Date   Memory loss    Past Surgical History:  Procedure Laterality Date   CATARACT EXTRACTION Bilateral    HEMORRHOID BANDING  2016     A IV Location/Drains/Wounds Patient Lines/Drains/Airways Status     Active Line/Drains/Airways     Name Placement date Placement time Site Days   Peripheral IV 02/25/23 22 G Right Antecubital 02/25/23  1833  Antecubital  1            Intake/Output Last 24 hours No intake or output data in the 24 hours ending 02/26/23 5956  Labs/Imaging Results for orders placed or performed during the hospital encounter of 02/25/23 (from the past 48 hour(s))  CBG monitoring, ED     Status: None   Collection Time:  02/25/23 12:21 PM  Result Value Ref Range   Glucose-Capillary 77 70 - 99 mg/dL    Comment: Glucose reference range applies only to samples taken after fasting for at least 8 hours.  CBC with Differential     Status: None   Collection Time: 02/25/23 12:28 PM  Result Value Ref Range   WBC 7.0 4.0 - 10.5 K/uL   RBC 5.00 4.22 - 5.81 MIL/uL   Hemoglobin 15.2 13.0 - 17.0 g/dL   HCT 38.7 56.4 - 33.2 %   MCV 93.6 80.0 - 100.0 fL   MCH 30.4 26.0 - 34.0 pg   MCHC 32.5 30.0 - 36.0 g/dL   RDW 95.1 88.4 - 16.6 %   Platelets 158 150 - 400 K/uL   nRBC 0.0 0.0 - 0.2 %   Neutrophils Relative % 67 %   Neutro Abs 4.7 1.7 - 7.7 K/uL   Lymphocytes Relative 22 %   Lymphs Abs 1.6 0.7 - 4.0 K/uL   Monocytes Relative 9 %   Monocytes Absolute 0.6 0.1 - 1.0 K/uL   Eosinophils Relative 2 %   Eosinophils Absolute 0.1 0.0 - 0.5 K/uL   Basophils Relative 0 %   Basophils Absolute 0.0 0.0 - 0.1 K/uL   Immature Granulocytes 0 %   Abs Immature Granulocytes 0.03 0.00 - 0.07 K/uL    Comment: Performed at [facility],  [address], [place], [place] [address]  Comprehensive metabolic panel     Status: None   Collection Time: 02/25/23 12:28 PM  Result Value Ref Range   Sodium 140 135 - 145 mmol/L   Potassium 4.3 3.5 - 5.1 mmol/L   Chloride 106 98 - 111 mmol/L   CO2 24 22 - 32 mmol/L   Glucose, Bld 94 70 - 99 mg/dL    Comment: Glucose reference range applies only to samples taken after fasting for at least 8 hours.   BUN 16 8 - 23 mg/dL   Creatinine, Ser 8.31 0.61 - 1.24 mg/dL   Calcium 9.6 8.9 - 51.7 mg/dL   Total Protein 7.2 6.5 - 8.1 g/dL   Albumin 4.1 3.5 - 5.0 g/dL   AST 27 15 - 41 U/L   ALT 36 0 - 44 U/L   Alkaline Phosphatase 69 38 - 126 U/L   Total Bilirubin 0.6 0.3 - 1.2 mg/dL   GFR, Estimated >61 >60 mL/min    Comment: (NOTE) Calculated using the CKD-EPI Creatinine Equation (2021)    Anion gap 10 5 - 15    Comment: Performed at [facility], [address], [place], [place]  [address]  SARS Coronavirus 2 by RT PCR (hospital order, performed in [facility] hospital lab) *cepheid single result test* Anterior Nasal Swab     Status: None   Collection Time: 02/25/23 12:47 PM   Specimen: Anterior Nasal Swab  Result Value Ref Range   SARS Coronavirus 2 by RT PCR NEGATIVE NEGATIVE    Comment: Performed at [facility], [address], [place], [place] [address]  Urinalysis, Routine w reflex microscopic -Urine, Clean Catch     Status: Abnormal   Collection Time: 02/25/23  5:30 PM  Result Value Ref Range   Color, Urine STRAW (A) YELLOW   APPearance CLEAR CLEAR   Specific Gravity, Urine 1.008 1.005 - 1.030   pH 7.0 5.0 - 8.0   Glucose, UA NEGATIVE NEGATIVE mg/dL   Hgb urine dipstick NEGATIVE NEGATIVE   Bilirubin Urine NEGATIVE NEGATIVE   Ketones, ur NEGATIVE NEGATIVE mg/dL   Protein, ur NEGATIVE NEGATIVE mg/dL   Nitrite NEGATIVE NEGATIVE   Leukocytes,Ua NEGATIVE NEGATIVE    Comment: Performed at [facility], [address], [place], [place] [address]  Comprehensive metabolic panel     Status: Abnormal   Collection Time: 02/26/23  5:39 AM  Result Value Ref Range   Sodium 139 135 - 145 mmol/L   Potassium 4.3 3.5 - 5.1 mmol/L   Chloride 106 98 - 111 mmol/L   CO2 24 22 - 32 mmol/L   Glucose, Bld 106 (H) 70 - 99 mg/dL    Comment: Glucose reference range applies only to samples taken after fasting for at least 8 hours.   BUN 14 8 - 23 mg/dL   Creatinine, Ser 7.03 0.61 - 1.24 mg/dL   Calcium 9.2 8.9 - 50.0 mg/dL   Total Protein 6.4 (L) 6.5 - 8.1 g/dL   Albumin 3.6 3.5 - 5.0 g/dL   AST 23 15 - 41 U/L   ALT 33 0 - 44 U/L   Alkaline Phosphatase 63 38 - 126 U/L   Total Bilirubin 0.9 0.3 - 1.2 mg/dL   GFR, Estimated >93 >81 mL/min    Comment: (NOTE) Calculated using the CKD-EPI Creatinine Equation (2021)    Anion gap 9 5 - 15    Comment: Performed at [facility],  [address], [place], [place] [address]  CBC     Status: Abnormal   Collection  Time: 02/26/23  5:39 AM  Result Value Ref Range   WBC 6.3 4.0 - 10.5 K/uL   RBC 4.86 4.22 - 5.81 MIL/uL   Hemoglobin 14.3 13.0 - 17.0 g/dL   HCT 13.0 86.5 - 78.4 %   MCV 89.9 80.0 - 100.0 fL   MCH 29.4 26.0 - 34.0 pg   MCHC 32.7 30.0 - 36.0 g/dL   RDW 69.6 29.5 - 28.4 %   Platelets 148 (L) 150 - 400 K/uL   nRBC 0.0 0.0 - 0.2 %    Comment: Performed at [facility], [address], [place], [place] [address]   MR Brain W and Wo Contrast  Result Date: 02/25/2023 CLINICAL DATA:  Neuro deficit, acute, stroke suspected. EXAM: MRI HEAD WITHOUT AND WITH CONTRAST TECHNIQUE: Multiplanar, multiecho pulse sequences of the brain and surrounding structures were obtained without and with intravenous contrast. CONTRAST:  7mL GADAVIST GADOBUTROL 1 MMOL/ML IV SOLN COMPARISON:  MRI brain 08/07/2016.  Head CT 02/25/2023. FINDINGS: Brain: No acute infarct or hemorrhage. The previously questioned abnormality in the brainstem on same-day head CT is resolved to represent artifact. Mild chronic small-vessel disease. Hemosiderin deposition in the left frontal periventricular white matter from prior hemorrhage. Progressive dilatation of the lateral and third ventricles since 2018 with acute callosal angle and elevated Evans index. No extra-axial collection, mass or midline shift. Motion degraded postcontrast images without definite abnormal enhancement. Vascular: Normal flow voids. Skull and upper cervical spine: Normal marrow signal. Sinuses/Orbits: Right mastoid and middle ear effusion. Trace fluid in the left mastoid air cells. No nasopharyngeal mass. Orbits are unremarkable. Other: None. IMPRESSION: 1. No acute intracranial abnormality. 2. Progressive dilatation of the lateral and third ventricles since 2018 with acute callosal angle and elevated Evans index. These findings can be seen in the setting of idiopathic normal pressure hydrocephalus. 3. Right mastoid and middle ear effusion, suggestive of otomastoiditis  and/or Eustachian tube dysfunction. Electronically Signed   By: Orvan Falconer M.D.   On: 02/25/2023 19:33   CT Head Wo Contrast  Result Date: 02/25/2023 CLINICAL DATA:  Altered level of consciousness, neurologic deficit EXAM: CT HEAD WITHOUT CONTRAST TECHNIQUE: Contiguous axial images were obtained from the base of the skull through the vertex without intravenous contrast. RADIATION DOSE REDUCTION: This exam was performed according to the departmental dose-optimization program which includes automated exposure control, adjustment of the mA and/or kV according to patient size and/or use of iterative reconstruction technique. COMPARISON:  08/07/2016 FINDINGS: Brain: Focal hypodensity within the left ventral aspect of the pons, reference image 13/3, compatible with age indeterminate ischemic change. No other signs of acute infarct or hemorrhage. Diffuse cerebral atrophy with ex vacuo dilatation of the lateral ventricles. Remaining midline structures are unremarkable. No acute extra-axial fluid collections. No mass effect. Vascular: No hyperdense vessel or unexpected calcification. Skull: Normal. Negative for fracture or focal lesion. Sinuses/Orbits: Chronic right mastoid effusion. Remaining paranasal sinuses are clear. Other: None. IMPRESSION: 1. Hypodensity within the left ventral aspect of the pons, consistent with age-indeterminate ischemic change. 2. No evidence of acute hemorrhage. 3. Chronic right mastoid effusion. Electronically Signed   By: Sharlet Salina M.D.   On: 02/25/2023 16:05   DG Chest 2 View  Result Date: 02/25/2023 CLINICAL DATA:  Altered mental status EXAM: CHEST - 2 VIEW COMPARISON:  None Available. FINDINGS: Underinflation. Slight elevation of the right hemidiaphragm. No consolidation, pneumothorax  or effusion. No edema. Normal cardiopericardial silhouette. IMPRESSION: Underinflation.  No acute cardiopulmonary disease. Electronically Signed   By: Karen Kays M.D.   On: 02/25/2023 14:38     Pending Labs Unresulted Labs (From admission, onward)    None       Vitals/Pain Today's Vitals   02/26/23 0107 02/26/23 0255 02/26/23 0315 02/26/23 0424  BP:    127/76  Pulse: 81 61 65 68  Resp: 15 16  16   Temp:    (!) 97.4 F (36.3 C)  TempSrc:    Axillary  SpO2: 98% 97% 97% 96%  Weight:      Height:      PainSc:        Isolation Precautions No active isolations  Medications Medications  ezetimibe (ZETIA) tablet 10 mg (has no administration in time range)  omega-3 acid ethyl esters (LOVAZA) capsule 1 g (has no administration in time range)  escitalopram (LEXAPRO) tablet 5 mg (has no administration in time range)  memantine (NAMENDA) tablet 10 mg (10 mg Oral Given 02/25/23 2251)  levothyroxine (SYNTHROID) tablet 25 mcg (has no administration in time range)  pantoprazole (PROTONIX) EC tablet 40 mg (has no administration in time range)  melatonin tablet 5 mg (5 mg Oral Given 02/25/23 2251)  enoxaparin (LOVENOX) injection 40 mg (40 mg Subcutaneous Given 02/25/23 2251)  sodium chloride flush (NS) 0.9 % injection 3 mL (3 mLs Intravenous Given 02/25/23 2256)  sodium chloride flush (NS) 0.9 % injection 3 mL (has no administration in time range)  0.9 %  sodium chloride infusion (has no administration in time range)  acetaminophen (TYLENOL) tablet 650 mg (has no administration in time range)    Or  acetaminophen (TYLENOL) suppository 650 mg (has no administration in time range)  senna-docusate (Senokot-S) tablet 1 tablet (has no administration in time range)  ondansetron (ZOFRAN) tablet 4 mg (has no administration in time range)    Or  ondansetron (ZOFRAN) injection 4 mg (has no administration in time range)  hydrALAZINE (APRESOLINE) injection 5 mg (has no administration in time range)  haloperidol lactate (HALDOL) injection 1 mg (1 mg Intravenous Given 02/25/23 2342)  gadobutrol (GADAVIST) 1 MMOL/ML injection 7 mL (7 mLs Intravenous Contrast Given 02/25/23 1833)     Mobility walks with device     Focused Assessments Neuro Assessment Handoff:  Swallow screen pass? No          Neuro Assessment: Exceptions to WDL Neuro Checks:      Has TPA been given? No If patient is a Neuro Trauma and patient is going to OR before floor call report to 4N Charge nurse: [phone] or [phone]   R Recommendations: See Admitting Provider Note  Report given to:   Additional Notes: needs Recruitment consultant

## 2023-02-26 NOTE — ED Notes (Signed)
Pt resting comfortably at this time, rise and fall of chest noted.

## 2023-03-06 DIAGNOSIS — R2689 Other abnormalities of gait and mobility: Secondary | ICD-10-CM | POA: Diagnosis not present

## 2023-03-06 DIAGNOSIS — R52 Pain, unspecified: Secondary | ICD-10-CM | POA: Diagnosis not present

## 2023-03-06 DIAGNOSIS — M6281 Muscle weakness (generalized): Secondary | ICD-10-CM | POA: Diagnosis not present

## 2023-03-06 DIAGNOSIS — G308 Other Alzheimer's disease: Secondary | ICD-10-CM | POA: Diagnosis not present

## 2023-03-07 DIAGNOSIS — R1012 Left upper quadrant pain: Secondary | ICD-10-CM

## 2023-03-07 DIAGNOSIS — G9340 Encephalopathy, unspecified: Secondary | ICD-10-CM

## 2023-03-07 DIAGNOSIS — J189 Pneumonia, unspecified organism: Secondary | ICD-10-CM

## 2023-03-12 ENCOUNTER — Encounter: Payer: Self-pay | Admitting: Internal Medicine

## 2023-03-12 ENCOUNTER — Non-Acute Institutional Stay: Payer: Self-pay | Admitting: Internal Medicine

## 2023-03-12 ENCOUNTER — Non-Acute Institutional Stay: Payer: Medicare HMO | Admitting: Internal Medicine

## 2023-03-12 DIAGNOSIS — F0280 Dementia in other diseases classified elsewhere without behavioral disturbance: Secondary | ICD-10-CM

## 2023-03-12 DIAGNOSIS — R1013 Epigastric pain: Secondary | ICD-10-CM | POA: Insufficient documentation

## 2023-03-12 DIAGNOSIS — R262 Difficulty in walking, not elsewhere classified: Secondary | ICD-10-CM | POA: Insufficient documentation

## 2023-03-12 DIAGNOSIS — R21 Rash and other nonspecific skin eruption: Secondary | ICD-10-CM | POA: Insufficient documentation

## 2023-03-12 NOTE — Assessment & Plan Note (Signed)
Patient has moderate to advanced dementia.  I have reviewed MRI but doubt that this is a normal pressure hydrocephalus but if wife found I will arrange for neurology evaluation.

## 2023-03-12 NOTE — Assessment & Plan Note (Signed)
I doubt that patient has normal pressure hydrocephalus as his symptoms started recently.  Because of dementia he is a poor candidate for physical therapy.  I will discuss with the wife about questionable normal pressure hydrocephalus.

## 2023-03-12 NOTE — Assessment & Plan Note (Addendum)
Drug reaction and as doxycycline was stopped the rash is improving.  Staff will use calamine lotion.

## 2023-03-12 NOTE — Progress Notes (Signed)
Acute Office Visit  Subjective:     Patient ID: Carlos Pierce, male    DOB: [birth year], 82 y.o.   MRN: [id number]  Chief Complaint  Patient presents with   Acute Visit   Abdominal Pain    Abdominal Pain Pertinent negatives include no fever.   Patient is in today for at assisted living of [facility] on the request of wife and staff.  Wife is worried that patient has deteriorated significantly recently.  Patient was taken to the emergency room 2 weeks ago where blood test was done and MRI brain shows a large ventricle with possibility of normal pressure hydrocephalus. Patient has dementia and is in the dementia unit.  He is a poor historian.  Wife noticed that he is complaining of abdominal pain and he put his hand in his mid abdomen.  I have seen him for this reason and I did urine analysis that was negative.  Abdominal x-ray shows  nonspecific gas pattern, chest x-ray shows bilateral opacities.  His kidney function and white cell count were normal.  I have started him on doxycycline last week but he developed a rash on his arms and legs over the weekend so doxycycline was stopped.  Patient goes independently to bathroom.  He does not know about his bowel movement pattern.  Review of Systems  Constitutional:  Negative for chills and fever.  Gastrointestinal:  Positive for abdominal pain.        Objective:    There were no vitals taken for this visit.   Physical Exam Constitutional:      Appearance: He is ill-appearing.  Cardiovascular:     Rate and Rhythm: Normal rate and regular rhythm.  Pulmonary:     Effort: Pulmonary effort is normal.     Breath sounds: Normal breath sounds.  Abdominal:     General: Bowel sounds are normal.     Palpations: There is no mass.     Tenderness: There is abdominal tenderness in the right upper quadrant, epigastric area and left upper quadrant. There is no guarding or rebound.  Neurological:     Comments: Has moderate to advanced  dementia and ambulate with a rollator.  He is a risk to fall because of his ambulation issue.  Could not do any further neurological examination.     No results found for any visits on 03/12/23.      Assessment & Plan:   Problem List Items Addressed This Visit       Nervous and Auditory   Alzheimer disease [facility])    Patient has moderate to advanced dementia.  I have reviewed MRI but doubt that this is a normal pressure hydrocephalus but if wife found I will arrange for neurology evaluation.        Musculoskeletal and Integument   Rash - Primary    Drug reaction and as doxycycline was stopped the rash is improving.  Staff will use calamine lotion.        Other   Epigastric pain    I will start omeprazole 40 mg daily.  I will encourage staff to watch for his bowel movement pattern.  If pain is not better then I will do CT scan abdomen.  He has CT scan done in June 2024 that shows constipation with fecal impaction.      Ambulatory dysfunction    I doubt that patient has normal pressure hydrocephalus as his symptoms started recently.  Because of dementia he is a poor candidate for  physical therapy.  I will discuss with the wife about questionable normal pressure hydrocephalus.       No orders of the defined types were placed in this encounter.   Return in about 2 weeks (around 03/26/2023).  Eloisa Northern, MD

## 2023-03-12 NOTE — Assessment & Plan Note (Signed)
I will start omeprazole 40 mg daily.  I will encourage staff to watch for his bowel movement pattern.  If pain is not better then I will do CT scan abdomen.  He has CT scan done in June 2024 that shows constipation with fecal impaction.

## 2023-03-26 ENCOUNTER — Other Ambulatory Visit: Payer: Self-pay | Admitting: Internal Medicine

## 2023-03-26 ENCOUNTER — Ambulatory Visit
Admission: RE | Admit: 2023-03-26 | Discharge: 2023-03-26 | Disposition: A | Discharge status: Home / Self Care | Payer: Medicare HMO | Source: Ambulatory Visit | Attending: Internal Medicine | Admitting: Internal Medicine

## 2023-03-26 DIAGNOSIS — R0781 Pleurodynia: Secondary | ICD-10-CM

## 2023-03-29 ENCOUNTER — Emergency Department (HOSPITAL_COMMUNITY): Payer: Medicare HMO

## 2023-03-29 ENCOUNTER — Emergency Department (HOSPITAL_COMMUNITY)
Admission: EM | Admit: 2023-03-29 | Discharge: 2023-03-29 | Disposition: A | Discharge status: Home / Self Care | Payer: Medicare HMO | Attending: Emergency Medicine | Admitting: Emergency Medicine

## 2023-03-29 ENCOUNTER — Other Ambulatory Visit: Payer: Self-pay

## 2023-03-29 ENCOUNTER — Encounter (HOSPITAL_COMMUNITY): Payer: Self-pay

## 2023-03-29 DIAGNOSIS — I700 Atherosclerosis of aorta: Secondary | ICD-10-CM | POA: Diagnosis not present

## 2023-03-29 DIAGNOSIS — I2510 Atherosclerotic heart disease of native coronary artery without angina pectoris: Secondary | ICD-10-CM | POA: Diagnosis not present

## 2023-03-29 DIAGNOSIS — M545 Low back pain, unspecified: Secondary | ICD-10-CM | POA: Diagnosis present

## 2023-03-29 DIAGNOSIS — W1830XA Fall on same level, unspecified, initial encounter: Secondary | ICD-10-CM | POA: Insufficient documentation

## 2023-03-29 DIAGNOSIS — F0390 Unspecified dementia without behavioral disturbance: Secondary | ICD-10-CM | POA: Diagnosis not present

## 2023-03-29 DIAGNOSIS — S32018A Other fracture of first lumbar vertebra, initial encounter for closed fracture: Secondary | ICD-10-CM | POA: Insufficient documentation

## 2023-03-29 DIAGNOSIS — S32010A Wedge compression fracture of first lumbar vertebra, initial encounter for closed fracture: Secondary | ICD-10-CM

## 2023-03-29 DIAGNOSIS — K5730 Diverticulosis of large intestine without perforation or abscess without bleeding: Secondary | ICD-10-CM | POA: Insufficient documentation

## 2023-03-29 DIAGNOSIS — K4020 Bilateral inguinal hernia, without obstruction or gangrene, not specified as recurrent: Secondary | ICD-10-CM | POA: Diagnosis not present

## 2023-03-29 DIAGNOSIS — S32009A Unspecified fracture of unspecified lumbar vertebra, initial encounter for closed fracture: Secondary | ICD-10-CM

## 2023-03-29 LAB — COMPREHENSIVE METABOLIC PANEL
ALT: 40 U/L (ref 0–44)
AST: 36 U/L (ref 15–41)
Albumin: 3.9 g/dL (ref 3.5–5.0)
Alkaline Phosphatase: 73 U/L (ref 38–126)
Anion gap: 10 (ref 5–15)
BUN: 23 mg/dL (ref 8–23)
CO2: 24 mmol/L (ref 22–32)
Calcium: 9.5 mg/dL (ref 8.9–10.3)
Chloride: 105 mmol/L (ref 98–111)
Creatinine, Ser: 1 mg/dL (ref 0.61–1.24)
GFR, Estimated: >60 mL/min (ref >60)
Glucose, Bld: 145 mg/dL — ABNORMAL HIGH (ref 70–99)
Potassium: 4.3 mmol/L (ref 3.5–5.1)
Sodium: 139 mmol/L (ref 135–145)
Total Bilirubin: 0.7 mg/dL (ref 0.3–1.2)
Total Protein: 7 g/dL (ref 6.5–8.1)

## 2023-03-29 LAB — CBC WITH DIFFERENTIAL/PLATELET
Abs Immature Granulocytes: 0.02 10*3/uL (ref 0.00–0.07)
Basophils Absolute: 0 10*3/uL (ref 0.0–0.1)
Basophils Relative: 0 %
Eosinophils Absolute: 0.1 10*3/uL (ref 0.0–0.5)
Eosinophils Relative: 1 %
HCT: 40.6 % (ref 39.0–52.0)
Hemoglobin: 13.4 g/dL (ref 13.0–17.0)
Immature Granulocytes: 0 %
Lymphocytes Relative: 10 %
Lymphs Abs: 0.8 10*3/uL (ref 0.7–4.0)
MCH: 29.9 pg (ref 26.0–34.0)
MCHC: 33 g/dL (ref 30.0–36.0)
MCV: 90.6 fL (ref 80.0–100.0)
Monocytes Absolute: 0.6 10*3/uL (ref 0.1–1.0)
Monocytes Relative: 7 %
Neutro Abs: 7 10*3/uL (ref 1.7–7.7)
Neutrophils Relative %: 82 %
Platelets: 168 10*3/uL (ref 150–400)
RBC: 4.48 MIL/uL (ref 4.22–5.81)
RDW: 13.4 % (ref 11.5–15.5)
WBC: 8.6 10*3/uL (ref 4.0–10.5)
nRBC: 0 % (ref 0.0–0.2)

## 2023-03-29 LAB — URINALYSIS, ROUTINE W REFLEX MICROSCOPIC
Bilirubin Urine: NEGATIVE
Glucose, UA: NEGATIVE mg/dL
Hgb urine dipstick: NEGATIVE
Ketones, ur: NEGATIVE mg/dL
Leukocytes,Ua: NEGATIVE
Nitrite: NEGATIVE
Protein, ur: NEGATIVE mg/dL
Specific Gravity, Urine: 1.01 (ref 1.005–1.030)
pH: 7 (ref 5.0–8.0)

## 2023-03-29 MED ORDER — IOHEXOL 300 MG/ML  SOLN
100.0000 mL | Freq: Once | INTRAMUSCULAR | Status: AC | PRN
  Administered 2023-03-29: 100 mL via INTRAVENOUS
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
## 2023-03-29 NOTE — Discharge Instructions (Signed)
Overall patient has compression fracture of lumbar L1,  transverse process fracture of L2.  Overall these are stable fractures.  Recommend ongoing use of Tylenol for pain management.  But most importantly please be careful with transfers to the wheelchair which would likely cause the most discomfort.  These fractures should heal well but please avoid patient sitting down hard.  Consider using a cushion.  Overall follow-up with primary care doctor and Dr. Danielle Dess with spine team.

## 2023-03-29 NOTE — ED Provider Notes (Signed)
[name]   CSN: [id number] Arrival date & time: 03/29/23  1622     History  Chief Complaint  Patient presents with   Carlos Pierce is a 82 y.o. male.  Level 5 caveat due to dementia.  Family at the bedside states a couple falls here to the last few days.  History of dementia supposed to be in a wheelchair at baseline but he often tries to get up on his own.  He is not on any blood thinners.  He is at 24/7 care memory unit now.  They were worried because he is having some low back pain.  But he is not endorsing any discomfort now.  They did not think he hit his head but he had unwitnessed falls.  Patient denies any pain currently.  No complaints.  Can tell me his name.  Wife states that he is at his baseline with his chronic speech issues.  He has not had a fever.  I checked a urinalysis today as well.  The history is provided by the patient and a caregiver.       Home Medications Prior to Admission medications   Medication Sig Start Date End Date Taking? Authorizing Provider  betamethasone, augmented, (DIPROLENE) 0.05 % lotion Apply 1 application  topically daily. As needed    [provider]  escitalopram (LEXAPRO) 5 MG tablet Take 1 tablet (5 mg total) by mouth every other day. 12/20/22   Lomax, Amy, NP  ezetimibe (ZETIA) 10 MG tablet TAKE 1 TABLET EVERY DAY 10/22/22   Lennette Bihari, MD  fluticasone [facility]) 50 MCG/ACT nasal spray Place 1 spray into both nostrils in the morning. 01/18/22   [provider]  levothyroxine (SYNTHROID) 25 MCG tablet Take 25 mcg by mouth daily before breakfast.    [provider]  LORazepam (ATIVAN) 0.5 MG tablet Take 0.5 mg by mouth daily. 01/14/23   [provider]  melatonin 5 MG TABS Take 5 mg by mouth at bedtime.    [provider]  memantine (NAMENDA) 10 MG tablet Take 1 tablet (10 mg total) by mouth 2 (two) times daily.  12/20/22   Lomax, Amy, NP  Multiple Vitamins-Minerals (MULTIVITAMIN & MINERAL PO) Take 1 tablet by mouth daily.    [provider]  pantoprazole (PROTONIX) 40 MG tablet Take 40 mg by mouth daily.    [provider]  polyethylene glycol powder (GLYCOLAX/MIRALAX) 17 GM/SCOOP powder Take 255 g by mouth in the morning, at noon, and at bedtime. One scoop in beverage of your choice with each meal. You may increase if you continue to be constipated and decrease if your stool becomes too loose. 11/13/22   Gailen Shelter, PA  Probiotic Product (PROBIOTIC BLEND PO) Take 1 tablet by mouth in the morning.    [provider]      Allergies    Patient has no known allergies.    Review of Systems   Review of Systems  Physical Exam Updated Vital Signs BP (!) 158/88 (BP Location: Right Arm)   Pulse 77   Temp 98.3 F (36.8 C) (Oral)   Resp 18   Ht 5\' 6"  (1.676 m)   Wt 73 kg   SpO2 95%   BMI 25.98 kg/m  Physical Exam Vitals and nursing note reviewed.  Constitutional:      General: He is not in acute distress.    Appearance: He  is well-developed. He is not ill-appearing.  HENT:     Head: Normocephalic and atraumatic.     Nose: Nose normal.     Mouth/Throat:     Mouth: Mucous membranes are moist.  Eyes:     Extraocular Movements: Extraocular movements intact.     Conjunctiva/sclera: Conjunctivae normal.     Pupils: Pupils are equal, round, and reactive to light.  Cardiovascular:     Rate and Rhythm: Normal rate and regular rhythm.     Pulses: Normal pulses.     Heart sounds: Normal heart sounds. No murmur heard. Pulmonary:     Effort: Pulmonary effort is normal. No respiratory distress.     Breath sounds: Normal breath sounds.  Abdominal:     Palpations: Abdomen is soft.     Tenderness: There is no abdominal tenderness.  Musculoskeletal:        General: No swelling or tenderness. Normal range of motion.     Cervical back: Normal range of motion and neck supple.   Skin:    General: Skin is warm and dry.     Capillary Refill: Capillary refill takes less than 2 seconds.  Neurological:     Mental Status: He is alert. Mental status is at baseline.  Psychiatric:        Mood and Affect: Mood normal.     ED Results / Procedures / Treatments   Labs (all labs ordered are listed, but only abnormal results are displayed) Labs Reviewed  COMPREHENSIVE METABOLIC PANEL - Abnormal; Notable for the following components:      Result Value   Glucose, Bld 145 (*)    All other components within normal limits  URINALYSIS, ROUTINE W REFLEX MICROSCOPIC - Abnormal; Notable for the following components:   Color, Urine STRAW (*)    All other components within normal limits  CBC WITH DIFFERENTIAL/PLATELET    EKG None  Radiology CT L-SPINE NO CHARGE  Result Date: 03/29/2023 CLINICAL DATA:  [id number] Pain [id number] EXAM: CT LUMBAR SPINE WITHOUT CONTRAST TECHNIQUE: Multidetector CT imaging of the lumbar spine was performed without intravenous contrast administration. Multiplanar CT image reconstructions were also generated. RADIATION DOSE REDUCTION: This exam was performed according to the departmental dose-optimization program which includes automated exposure control, adjustment of the mA and/or kV according to patient size and/or use of iterative reconstruction technique. COMPARISON:  CT abdomen pelvis 11/13/2022 FINDINGS: Segmentation: 5 lumbar type vertebrae. Alignment: Normal. Vertebrae: Multilevel moderate degenerative changes of the spine with mark Schmorl node formation along the L3 superior and inferior endplates as well as L4 superior endplate. Acute nondisplaced left L2 transverse process fracture. Acute minimally displaced triangular fracture of the anterior superior endplate and anterior wall of the L1 vertebral body. No severe osseous neural foraminal or central canal stenosis. No focal pathologic process. Paraspinal and other soft tissues: Negative. Disc levels:  Intervertebral disc space vacuum phenomenon at the L2-L3 and L3-L4 levels. Diffuse IMPRESSION: 1. Acute minimally displaced triangular fracture of the anterior superior endplate and anterior wall of the L1 vertebral body. 2. Acute nondisplaced left L2 transverse process fracture. Electronically Signed   By: Tish Frederickson M.D.   On: 03/29/2023 19:46   CT ABDOMEN PELVIS W CONTRAST  Result Date: 03/29/2023 CLINICAL DATA:  Abdominal pain, acute, nonlocalized.  Trauma EXAM: CT ABDOMEN AND PELVIS WITH CONTRAST TECHNIQUE: Multidetector CT imaging of the abdomen and pelvis was performed using the standard protocol following bolus administration of intravenous contrast. RADIATION DOSE REDUCTION: This exam was performed according to  the departmental dose-optimization program which includes automated exposure control, adjustment of the mA and/or kV according to patient size and/or use of iterative reconstruction technique. CONTRAST:  OMNIPAQUE IOHEXOL 300 MG/ML  SOLN COMPARISON:  CT abdomen pelvis 11/13/2022 FINDINGS: Lower chest: No acute abnormality. Coronary artery calcification. Question trace hiatal hernia. Hepatobiliary: Not enlarged. No focal lesion. No laceration or subcapsular hematoma. Contracted gallbladder. The gallbladder is otherwise unremarkable with no radio-opaque gallstones. No biliary ductal dilatation. Pancreas: Normal pancreatic contour. No main pancreatic duct dilatation. Spleen: Not enlarged. No focal lesion. No laceration, subcapsular hematoma, or vascular injury. Adrenals/Urinary Tract: No nodularity bilaterally. Bilateral kidneys enhance symmetrically. No hydronephrosis. No contusion, laceration, or subcapsular hematoma. Fluid density lesions within bilateral kidneys likely represent simple renal cysts. Simple renal cysts, in the absence of clinically indicated signs/symptoms, require no independent follow-up. No injury to the vascular structures or collecting systems. No hydroureter. The  urinary bladder is unremarkable. On delayed imaging, there is no urothelial wall thickening and there are no filling defects in the opacified portions of the bilateral collecting systems or ureters. Stomach/Bowel: No small or large bowel wall thickening or dilatation. Colonic diverticulosis. The appendix is unremarkable. Vasculature/Lymphatics: No abdominal aorta or iliac aneurysm. No active contrast extravasation or pseudoaneurysm. No abdominal, pelvic, inguinal lymphadenopathy. Reproductive: Prostate is enlarged measuring up to 5 cm. Other: No simple free fluid ascites. No pneumoperitoneum. No hemoperitoneum. No mesenteric hematoma identified. No organized fluid collection. Musculoskeletal: No significant soft tissue hematoma. Bilateral small volume fat containing inguinal hernias. No acute pelvic fracture. No suspicious lytic or blastic osseous lesions. Please see separately dictated CT lumbar spine 03/29/2023. IMPRESSION: 1. No acute intra-abdominal or intrapelvic traumatic injury. 2. Other imaging findings of potential clinical significance: Prostatomegaly. Colonic diverticulosis with no acute diverticulitis. Bilateral small volume fat containing inguinal hernias. Aortic Atherosclerosis (ICD10-I70.0) including coronary calcification. 3. Please see separately dictated CT lumbar spine 03/29/2023. Electronically Signed   By: Tish Frederickson M.D.   On: 03/29/2023 19:40   CT Head Wo Contrast  Result Date: 03/29/2023 CLINICAL DATA:  Polytrauma, blunt EXAM: CT HEAD WITHOUT CONTRAST CT CERVICAL SPINE WITHOUT CONTRAST TECHNIQUE: Multidetector CT imaging of the head and cervical spine was performed following the standard protocol without intravenous contrast. Multiplanar CT image reconstructions of the cervical spine were also generated. RADIATION DOSE REDUCTION: This exam was performed according to the departmental dose-optimization program which includes automated exposure control, adjustment of the mA and/or kV  according to patient size and/or use of iterative reconstruction technique. COMPARISON:  MRI head 02/25/2023 FINDINGS: CT HEAD FINDINGS Brain: Stable prominence of the lateral ventricles may be related to central predominant atrophy, although a component of normal pressure/communicating hydrocephalus cannot be excluded. Patchy and confluent areas of decreased attenuation are noted throughout the deep and periventricular white matter of the cerebral hemispheres bilaterally, compatible with chronic microvascular ischemic disease. No evidence of large-territorial acute infarction. No parenchymal hemorrhage. No mass lesion. No extra-axial collection. No mass effect or midline shift. No hydrocephalus. Basilar cisterns are patent. Vascular: No hyperdense vessel. Skull: No acute fracture or focal lesion. Sinuses/Orbits: Complete opacification of the right mastoid air cells as well as fluid within the right middle ear. Otherwise paranasal sinuses and left mastoid air cells are clear. Bilateral lens replacement. Otherwise the orbits are unremarkable. Other: None. CT CERVICAL SPINE FINDINGS Alignment: Normal. Skull base and vertebrae: Multilevel mild-to-moderate degenerative changes of the spine. No acute fracture. No aggressive appearing focal osseous lesion or focal pathologic process. Soft tissues and spinal canal:  No prevertebral fluid or swelling. No visible canal hematoma. Upper chest: Paraseptal emphysematous changes. Other: None. IMPRESSION: 1. No acute intracranial abnormality. 2. No acute displaced fracture or traumatic listhesis of the cervical spine. 3. Stable prominence of the lateral ventricles may be related to central predominant atrophy, although a component of normal pressure/communicating hydrocephalus cannot be excluded. 4. Complete opacification of the right mastoid air cells as well as fluid within the right middle ear. Correlate clinically for infection. Electronically Signed   By: Tish Frederickson M.D.    On: 03/29/2023 19:28   CT Cervical Spine Wo Contrast  Result Date: 03/29/2023 CLINICAL DATA:  Polytrauma, blunt EXAM: CT HEAD WITHOUT CONTRAST CT CERVICAL SPINE WITHOUT CONTRAST TECHNIQUE: Multidetector CT imaging of the head and cervical spine was performed following the standard protocol without intravenous contrast. Multiplanar CT image reconstructions of the cervical spine were also generated. RADIATION DOSE REDUCTION: This exam was performed according to the departmental dose-optimization program which includes automated exposure control, adjustment of the mA and/or kV according to patient size and/or use of iterative reconstruction technique. COMPARISON:  MRI head 02/25/2023 FINDINGS: CT HEAD FINDINGS Brain: Stable prominence of the lateral ventricles may be related to central predominant atrophy, although a component of normal pressure/communicating hydrocephalus cannot be excluded. Patchy and confluent areas of decreased attenuation are noted throughout the deep and periventricular white matter of the cerebral hemispheres bilaterally, compatible with chronic microvascular ischemic disease. No evidence of large-territorial acute infarction. No parenchymal hemorrhage. No mass lesion. No extra-axial collection. No mass effect or midline shift. No hydrocephalus. Basilar cisterns are patent. Vascular: No hyperdense vessel. Skull: No acute fracture or focal lesion. Sinuses/Orbits: Complete opacification of the right mastoid air cells as well as fluid within the right middle ear. Otherwise paranasal sinuses and left mastoid air cells are clear. Bilateral lens replacement. Otherwise the orbits are unremarkable. Other: None. CT CERVICAL SPINE FINDINGS Alignment: Normal. Skull base and vertebrae: Multilevel mild-to-moderate degenerative changes of the spine. No acute fracture. No aggressive appearing focal osseous lesion or focal pathologic process. Soft tissues and spinal canal: No prevertebral fluid or  swelling. No visible canal hematoma. Upper chest: Paraseptal emphysematous changes. Other: None. IMPRESSION: 1. No acute intracranial abnormality. 2. No acute displaced fracture or traumatic listhesis of the cervical spine. 3. Stable prominence of the lateral ventricles may be related to central predominant atrophy, although a component of normal pressure/communicating hydrocephalus cannot be excluded. 4. Complete opacification of the right mastoid air cells as well as fluid within the right middle ear. Correlate clinically for infection. Electronically Signed   By: Tish Frederickson M.D.   On: 03/29/2023 19:28   DG Chest 2 View  Result Date: 03/29/2023 CLINICAL DATA:  Fall with altered mental status. EXAM: CHEST - 2 VIEW COMPARISON:  Radiograph 03/26/2023 FINDINGS: Low lung volumes. Stable heart size and mediastinal contours. Chronic eventration of right hemidiaphragm. No pneumothorax, pulmonary edema, large pleural effusion or focal airspace disease. Compression deformity at the thoracolumbar junction, will be subsequently assessed on upcoming CT. IMPRESSION: Low lung volumes without acute chest finding. Compression deformity at the thoracolumbar junction will be assessed on upcoming CT. Electronically Signed   By: Narda Rutherford M.D.   On: 03/29/2023 18:29    Procedures Procedures    Medications Ordered in ED Medications  iohexol (OMNIPAQUE) 300 MG/ML solution 100 mL (100 mLs Intravenous Contrast Given 03/29/23 1812)    ED Course/ Medical Decision Making/ A&P  Medical Decision Making Amount and/or Complexity of Data Reviewed Labs: ordered. Radiology: ordered.  Risk Prescription drug management.   Carlos Pierce is here after unwitnessed fall.  History of dementia.  Seems that he is at his baseline per family.  Unremarkable vitals.  No fever.  Will check basic labs and get a CT scan of his head neck abdomen pelvis and low back.  They were worried  because he was complaining a lot of right lower back pain maybe abdominal pain earlier.  Overall he seems to be at his baseline.  He is not having any tenderness on exam.  Will evaluate for any electrolyte issues or traumatic processes.  Per my review interpretation labs no significant anemia or electrolyte abnormality or kidney injury or leukocytosis.  Urinalysis negative for infection.  CT scan of the head and neck were unremarkable as well as CT scan of the abdomen and pelvis.  CT scan of the lumbar spine did show L1 compression fracture as well as L2 transverse process fracture.  He is not having any pain at rest.  Seems like his pain mostly comes when he transfers to his wheelchair.  Family and I discussed pain regimen and will continue to use Tylenol.  Would like to try to avoid narcotics at this time.  I have stressed with facility to make sure they are careful with his transfers to his wheelchair not to the be too aggressive with it.  Will refer to spine team and primary care.  Discharged in good condition.  These are stable fractures.  He is nonambulatory and hopefully should heal well.  This chart was dictated using voice recognition software.  Despite best efforts to proofread,  errors can occur which can change the documentation meaning.         Final Clinical Impression(s) / ED Diagnoses Final diagnoses:  Compression fracture of L1 vertebra, initial encounter [facility])  Closed fracture of transverse process of lumbar vertebra, initial encounter [facility])    Rx / DC Orders ED Discharge Orders     None         Virgina Norfolk, DO 03/29/23 2004

## 2023-03-29 NOTE — ED Triage Notes (Signed)
Pt BIBA from [facility]. Pt has had 4x falls in the past day. Wife states pt is at their baseline w/aphasia and dementia. No obvious trauma, concerns are for increasing weakness

## 2023-03-29 NOTE — ED Notes (Signed)
Attempts made to call report back to [facility] at 2019 and 2022, HIPAA appropriate voicemail left and awaiting call back from nursing staff at this time. PTAR arranged with [facility] at 2024 for soonest available transport back to pt SNF.

## 2023-03-30 ENCOUNTER — Other Ambulatory Visit: Payer: Self-pay | Admitting: Internal Medicine

## 2023-03-30 MED ORDER — TRAMADOL HCL 50 MG PO TABS
50.0000 mg | ORAL_TABLET | Freq: Three times a day (TID) | ORAL | 1 refills | Status: AC | PRN
Start: 2023-03-30 — End: ?

## 2023-04-22 ENCOUNTER — Telehealth: Payer: Self-pay | Admitting: Internal Medicine

## 2023-04-22 NOTE — Telephone Encounter (Signed)
I have spoken with his wife about cxray result that he has small right 8th nerve fracture and will apply voltaren gel.

## 2023-04-23 ENCOUNTER — Other Ambulatory Visit: Payer: Self-pay | Admitting: Internal Medicine

## 2023-04-23 MED ORDER — DICLOFENAC SODIUM 1 % EX GEL: 2.0000 g | Freq: Four times a day (QID) | CUTANEOUS | 0 refills | Status: AC

## 2023-05-13 DIAGNOSIS — M6281 Muscle weakness (generalized): Secondary | ICD-10-CM | POA: Diagnosis not present

## 2023-05-13 DIAGNOSIS — R296 Repeated falls: Secondary | ICD-10-CM | POA: Diagnosis not present

## 2023-05-13 DIAGNOSIS — R2689 Other abnormalities of gait and mobility: Secondary | ICD-10-CM | POA: Diagnosis not present

## 2023-05-13 DIAGNOSIS — F03B18 Unspecified dementia, moderate, with other behavioral disturbance: Secondary | ICD-10-CM | POA: Diagnosis not present

## 2023-05-13 DIAGNOSIS — R2681 Unsteadiness on feet: Secondary | ICD-10-CM | POA: Diagnosis not present

## 2023-05-15 DIAGNOSIS — Z515 Encounter for palliative care: Secondary | ICD-10-CM | POA: Diagnosis not present

## 2023-05-15 DIAGNOSIS — R2689 Other abnormalities of gait and mobility: Secondary | ICD-10-CM | POA: Diagnosis not present

## 2023-05-15 DIAGNOSIS — R296 Repeated falls: Secondary | ICD-10-CM | POA: Diagnosis not present

## 2023-05-15 DIAGNOSIS — R2681 Unsteadiness on feet: Secondary | ICD-10-CM | POA: Diagnosis not present

## 2023-05-15 DIAGNOSIS — F03C2 Unspecified dementia, severe, with psychotic disturbance: Secondary | ICD-10-CM | POA: Diagnosis not present

## 2023-05-15 DIAGNOSIS — F03B18 Unspecified dementia, moderate, with other behavioral disturbance: Secondary | ICD-10-CM | POA: Diagnosis not present

## 2023-05-15 DIAGNOSIS — M6281 Muscle weakness (generalized): Secondary | ICD-10-CM | POA: Diagnosis not present

## 2023-05-17 DIAGNOSIS — G4733 Obstructive sleep apnea (adult) (pediatric): Secondary | ICD-10-CM | POA: Diagnosis not present

## 2023-05-17 DIAGNOSIS — I10 Essential (primary) hypertension: Secondary | ICD-10-CM | POA: Diagnosis not present

## 2023-05-20 DIAGNOSIS — F01518 Vascular dementia, unspecified severity, with other behavioral disturbance: Secondary | ICD-10-CM | POA: Diagnosis not present

## 2023-05-20 DIAGNOSIS — F02B3 Dementia in other diseases classified elsewhere, moderate, with mood disturbance: Secondary | ICD-10-CM | POA: Diagnosis not present

## 2023-05-20 DIAGNOSIS — F5105 Insomnia due to other mental disorder: Secondary | ICD-10-CM | POA: Diagnosis not present

## 2023-05-20 DIAGNOSIS — G301 Alzheimer's disease with late onset: Secondary | ICD-10-CM | POA: Diagnosis not present

## 2023-06-12 DEATH — deceased
